# Patient Record
Sex: Female | Born: 1980 | Hispanic: Yes | Marital: Married | Smoking: Never smoker
Health system: Southern US, Community
[De-identification: ages and names within clinical notes are randomized; demographics above are authoritative.]

## PROBLEM LIST (undated history)

## (undated) ENCOUNTER — Inpatient Hospital Stay (HOSPITAL_COMMUNITY): Payer: Self-pay

## (undated) DIAGNOSIS — R519 Headache, unspecified: Secondary | ICD-10-CM

## (undated) DIAGNOSIS — O24419 Gestational diabetes mellitus in pregnancy, unspecified control: Secondary | ICD-10-CM

## (undated) DIAGNOSIS — Z87442 Personal history of urinary calculi: Secondary | ICD-10-CM

## (undated) DIAGNOSIS — I10 Essential (primary) hypertension: Secondary | ICD-10-CM

## (undated) DIAGNOSIS — J45909 Unspecified asthma, uncomplicated: Secondary | ICD-10-CM

## (undated) DIAGNOSIS — R87629 Unspecified abnormal cytological findings in specimens from vagina: Secondary | ICD-10-CM

## (undated) DIAGNOSIS — E119 Type 2 diabetes mellitus without complications: Secondary | ICD-10-CM

## (undated) DIAGNOSIS — F419 Anxiety disorder, unspecified: Secondary | ICD-10-CM

## (undated) DIAGNOSIS — R51 Headache: Secondary | ICD-10-CM

## (undated) DIAGNOSIS — O169 Unspecified maternal hypertension, unspecified trimester: Secondary | ICD-10-CM

## (undated) DIAGNOSIS — E559 Vitamin D deficiency, unspecified: Secondary | ICD-10-CM

## (undated) HISTORY — PX: COLONOSCOPY: SHX174

## (undated) HISTORY — PX: KIDNEY STONE SURGERY: SHX686

## (undated) HISTORY — PX: TONSILLECTOMY: SUR1361

## (undated) HISTORY — PX: WISDOM TOOTH EXTRACTION: SHX21

## (undated) HISTORY — DX: Type 2 diabetes mellitus without complications: E11.9

## (undated) SURGERY — Surgical Case
Anesthesia: Regional

---

## 2004-07-16 ENCOUNTER — Ambulatory Visit (HOSPITAL_COMMUNITY): Admission: RE | Admit: 2004-07-16 | Discharge: 2004-07-16 | Payer: Self-pay | Admitting: Family Medicine

## 2005-07-02 ENCOUNTER — Ambulatory Visit: Payer: Self-pay | Admitting: Internal Medicine

## 2005-07-03 ENCOUNTER — Encounter (INDEPENDENT_AMBULATORY_CARE_PROVIDER_SITE_OTHER): Payer: Self-pay | Admitting: *Deleted

## 2005-07-03 ENCOUNTER — Ambulatory Visit: Payer: Self-pay | Admitting: Internal Medicine

## 2005-10-02 ENCOUNTER — Ambulatory Visit: Payer: Self-pay | Admitting: Internal Medicine

## 2007-01-12 ENCOUNTER — Emergency Department (HOSPITAL_COMMUNITY): Admission: EM | Admit: 2007-01-12 | Discharge: 2007-01-12 | Payer: Self-pay | Admitting: Emergency Medicine

## 2008-02-09 ENCOUNTER — Emergency Department (HOSPITAL_COMMUNITY): Admission: EM | Admit: 2008-02-09 | Discharge: 2008-02-09 | Payer: Self-pay | Admitting: Emergency Medicine

## 2010-03-04 ENCOUNTER — Emergency Department (HOSPITAL_COMMUNITY): Admission: EM | Admit: 2010-03-04 | Discharge: 2010-03-05 | Payer: Self-pay | Admitting: Emergency Medicine

## 2010-08-17 LAB — POCT I-STAT, CHEM 8
Calcium, Ion: 1.14 mmol/L (ref 1.12–1.32)
Glucose, Bld: 100 mg/dL — ABNORMAL HIGH (ref 70–99)
HCT: 51 % — ABNORMAL HIGH (ref 36.0–46.0)
Hemoglobin: 17.3 g/dL — ABNORMAL HIGH (ref 12.0–15.0)
TCO2: 24 mmol/L (ref 0–100)

## 2010-08-17 LAB — D-DIMER, QUANTITATIVE: D-Dimer, Quant: <0.22 ug/mL-FEU (ref 0.00–0.48)

## 2011-03-07 LAB — DIFFERENTIAL
Basophils Absolute: 0
Eosinophils Relative: 0
Lymphocytes Relative: 25
Neutro Abs: 3.8
Neutrophils Relative %: 64

## 2011-03-07 LAB — CBC
Platelets: 179
RDW: 12.2
WBC: 6

## 2011-03-07 LAB — D-DIMER, QUANTITATIVE: D-Dimer, Quant: <0.22

## 2014-05-18 ENCOUNTER — Encounter (HOSPITAL_COMMUNITY): Payer: Self-pay | Admitting: Obstetrics & Gynecology

## 2014-06-02 ENCOUNTER — Other Ambulatory Visit (HOSPITAL_COMMUNITY): Payer: Self-pay

## 2014-07-26 ENCOUNTER — Other Ambulatory Visit (HOSPITAL_COMMUNITY): Payer: Self-pay | Admitting: Obstetrics

## 2014-07-26 DIAGNOSIS — O280 Abnormal hematological finding on antenatal screening of mother: Secondary | ICD-10-CM

## 2014-07-27 ENCOUNTER — Ambulatory Visit (HOSPITAL_COMMUNITY): Payer: Self-pay

## 2014-07-27 ENCOUNTER — Ambulatory Visit (HOSPITAL_COMMUNITY): Admission: RE | Admit: 2014-07-27 | Payer: Self-pay | Source: Ambulatory Visit

## 2014-07-30 ENCOUNTER — Ambulatory Visit (HOSPITAL_COMMUNITY)
Admission: RE | Admit: 2014-07-30 | Discharge: 2014-07-30 | Disposition: A | Discharge status: Home / Self Care | Payer: BC Managed Care – PPO | Source: Ambulatory Visit | Attending: Obstetrics | Admitting: Obstetrics

## 2014-07-30 ENCOUNTER — Other Ambulatory Visit (HOSPITAL_COMMUNITY): Payer: Self-pay

## 2014-07-30 ENCOUNTER — Encounter (HOSPITAL_COMMUNITY): Payer: Self-pay

## 2014-07-30 VITALS — BP 143/102 | HR 83 | Wt 156.0 lb

## 2014-07-30 DIAGNOSIS — Z3A18 18 weeks gestation of pregnancy: Secondary | ICD-10-CM

## 2014-07-30 DIAGNOSIS — O280 Abnormal hematological finding on antenatal screening of mother: Secondary | ICD-10-CM

## 2014-07-30 DIAGNOSIS — Z315 Encounter for genetic counseling: Secondary | ICD-10-CM | POA: Diagnosis present

## 2014-07-30 DIAGNOSIS — Z36 Encounter for antenatal screening of mother: Secondary | ICD-10-CM | POA: Insufficient documentation

## 2014-07-30 DIAGNOSIS — Z3689 Encounter for other specified antenatal screening: Secondary | ICD-10-CM | POA: Insufficient documentation

## 2014-07-30 DIAGNOSIS — O283 Abnormal ultrasonic finding on antenatal screening of mother: Secondary | ICD-10-CM | POA: Diagnosis not present

## 2014-07-30 HISTORY — DX: Essential (primary) hypertension: I10

## 2014-08-02 DIAGNOSIS — O280 Abnormal hematological finding on antenatal screening of mother: Secondary | ICD-10-CM | POA: Insufficient documentation

## 2014-08-02 LAB — US OB COMP LESS 14 WKS

## 2014-08-02 NOTE — Progress Notes (Signed)
Genetic Counseling  High-Risk Gestation Note  Appointment Date:  07/30/2014 Referred By: Lendon ColonelFogleman, Kelly A., MD Date of Birth:  1980-11-11 Partner: Auburn BilberryMatthew Degracia   Pregnancy History: G1P0 Estimated Date of Delivery: 12/26/14 Estimated Gestational Age: 6797w5d Attending: Particia NearingMartha Decker, MD    Ms. Burt EkFrances L Elizabeth Yang and her partner, Mr. Auburn BilberryMatthew Slaymaker, were seen for consultation for genetic counseling because of an elevated MSAFP of 2.86 MoMs based on maternal serum screening through [facility].    We reviewed Ms. Elizabeth Yang's maternal serum screening result, the elevation of MSAFP, and the associated 1 in 149 risk for a fetal open neural tube defect.   We reviewed ONTDs, the typical multifactorial etiology, and variable prognosis.  In addition, we discussed additional explanations for an elevated MSAFP including: normal variation, twins, feto-maternal bleeding, a gestational dating error, abdominal wall defects, kidney differences, oligohydramnios, and placental problems.  We discussed that an unexplained elevation of MSAFP is associated with an increased risk for third trimester complications including: prematurity, low birth weight, and pre-eclampsia.    We reviewed additional available screening and diagnostic options including detailed ultrasound and amniocentesis.  We discussed the risks, limitations, and benefits of each.  After thoughtful consideration of these options, Ms. Elizabeth Yang elected to have ultrasound, but declined amniocentesis.  She understands that ultrasound cannot rule out all birth defects or genetic syndromes.  However, she was counseled that 80-90% of fetuses with ONTDs can be detected by detailed 2nd trimester ultrasound, when well visualized.  A complete ultrasound was performed today.  The ultrasound report will be sent under separate cover.  Ms. [name] was provided with written information regarding cystic fibrosis (CF) including the carrier frequency and  incidence in the Caucasian population, the availability of carrier testing and prenatal diagnosis if indicated.  In addition, we discussed that CF is routinely screened for as part of the [place] newborn screening panel.  She declined testing today.   Both family histories were reviewed and found to be contributory for intellectual disability for Mr. Mcclatchy's female paternal first cousin. Her medical issues were attributed to problems at labor and delivery, and she died at age [id number] years old. Limited information was known regarding this individual. Additionally, Ms. Elizabeth Yang reported a sister with a history of a learning disability and Attention Deficit Hyperactivity Disorder (ADHD). The couple was counseled that there are many different causes of intellectual disabilities including environmental, multifactorial, and genetic etiologies.  We discussed that a specific diagnosis for intellectual disability can be determined in approximately 50% of these individuals.  In the remaining 50% of individuals, a diagnosis may never be determined.  Regarding genetic causes, we discussed that chromosome aberrations (aneuploidy, deletions, duplications, insertions, and translocations) are responsible for a small percentage of individuals with intellectual disability.  Many individuals with chromosome aberrations have additional differences, including congenital anomalies or minor dysmorphisms.  Likewise, single gene conditions are the underlying cause of intellectual delay in some families.  We discussed that many gene conditions have intellectual disability as a feature, but also often include other physical or medical differences.  We discussed the option of this family having an evaluation by a medical geneticist to help determine the cause of the familial intellectual disability.  We discussed that without more specific information, it is difficult to provide an accurate risk assessment.  Further genetic counseling is  warranted if more information is obtained.  Ms. Burt EkFrances L Elizabeth Yang denied exposure to environmental toxins or chemical agents. She denied the use of tobacco or street drugs. She  reported drinking alcohol on 04/03/14 and none since that time. The all-or-none period was discussed, meaning exposures that occur in the first 4 weeks of gestation are typically thought to either not affect the pregnancy at all or result in a miscarriage. She denied significant viral illnesses during the course of her pregnancy. Her medical and surgical histories were noncontributory.   I counseled this couple for approximately 40 minutes regarding the above risks and available options.    Quinn Plowman, MS,  Certified [employer]  08/02/2014

## 2014-09-24 ENCOUNTER — Encounter (HOSPITAL_COMMUNITY): Payer: Self-pay | Admitting: Obstetrics and Gynecology

## 2014-09-24 ENCOUNTER — Ambulatory Visit (HOSPITAL_COMMUNITY)
Admit: 2014-09-24 | Discharge: 2014-09-24 | Disposition: A | Discharge status: Home / Self Care | Payer: BC Managed Care – PPO | Attending: Obstetrics and Gynecology | Admitting: Obstetrics and Gynecology

## 2014-09-24 ENCOUNTER — Inpatient Hospital Stay (HOSPITAL_COMMUNITY): Payer: BC Managed Care – PPO

## 2014-09-24 ENCOUNTER — Inpatient Hospital Stay (HOSPITAL_COMMUNITY)
Admission: AD | Admit: 2014-09-24 | Discharge: 2014-10-02 | DRG: 765 | Disposition: A | Discharge status: Home / Self Care | Payer: BC Managed Care – PPO | Source: Ambulatory Visit | Attending: Obstetrics & Gynecology | Admitting: Obstetrics & Gynecology

## 2014-09-24 DIAGNOSIS — O162 Unspecified maternal hypertension, second trimester: Secondary | ICD-10-CM | POA: Diagnosis present

## 2014-09-24 DIAGNOSIS — F419 Anxiety disorder, unspecified: Secondary | ICD-10-CM | POA: Diagnosis present

## 2014-09-24 DIAGNOSIS — O368920 Maternal care for other specified fetal problems, second trimester, not applicable or unspecified: Secondary | ICD-10-CM | POA: Diagnosis present

## 2014-09-24 DIAGNOSIS — O99344 Other mental disorders complicating childbirth: Secondary | ICD-10-CM | POA: Diagnosis present

## 2014-09-24 DIAGNOSIS — O99824 Streptococcus B carrier state complicating childbirth: Secondary | ICD-10-CM | POA: Diagnosis present

## 2014-09-24 DIAGNOSIS — Z9889 Other specified postprocedural states: Secondary | ICD-10-CM

## 2014-09-24 DIAGNOSIS — O9081 Anemia of the puerperium: Secondary | ICD-10-CM | POA: Diagnosis not present

## 2014-09-24 DIAGNOSIS — Z3A27 27 weeks gestation of pregnancy: Secondary | ICD-10-CM | POA: Diagnosis present

## 2014-09-24 DIAGNOSIS — O864 Pyrexia of unknown origin following delivery: Secondary | ICD-10-CM | POA: Diagnosis not present

## 2014-09-24 DIAGNOSIS — D649 Anemia, unspecified: Secondary | ICD-10-CM | POA: Diagnosis not present

## 2014-09-24 DIAGNOSIS — O1092 Unspecified pre-existing hypertension complicating childbirth: Secondary | ICD-10-CM | POA: Diagnosis present

## 2014-09-24 DIAGNOSIS — O9952 Diseases of the respiratory system complicating childbirth: Secondary | ICD-10-CM | POA: Diagnosis present

## 2014-09-24 DIAGNOSIS — Z23 Encounter for immunization: Secondary | ICD-10-CM | POA: Diagnosis not present

## 2014-09-24 DIAGNOSIS — O112 Pre-existing hypertension with pre-eclampsia, second trimester: Secondary | ICD-10-CM | POA: Diagnosis present

## 2014-09-24 DIAGNOSIS — IMO0002 Reserved for concepts with insufficient information to code with codable children: Secondary | ICD-10-CM | POA: Diagnosis present

## 2014-09-24 DIAGNOSIS — O133 Gestational [pregnancy-induced] hypertension without significant proteinuria, third trimester: Secondary | ICD-10-CM

## 2014-09-24 DIAGNOSIS — Z3A26 26 weeks gestation of pregnancy: Secondary | ICD-10-CM | POA: Diagnosis present

## 2014-09-24 DIAGNOSIS — Z3490 Encounter for supervision of normal pregnancy, unspecified, unspecified trimester: Secondary | ICD-10-CM

## 2014-09-24 DIAGNOSIS — O365920 Maternal care for other known or suspected poor fetal growth, second trimester, not applicable or unspecified: Secondary | ICD-10-CM | POA: Diagnosis present

## 2014-09-24 DIAGNOSIS — J45909 Unspecified asthma, uncomplicated: Secondary | ICD-10-CM | POA: Diagnosis present

## 2014-09-24 DIAGNOSIS — O1410 Severe pre-eclampsia, unspecified trimester: Secondary | ICD-10-CM | POA: Diagnosis present

## 2014-09-24 HISTORY — DX: Unspecified asthma, uncomplicated: J45.909

## 2014-09-24 HISTORY — DX: Headache, unspecified: R51.9

## 2014-09-24 HISTORY — DX: Unspecified abnormal cytological findings in specimens from vagina: R87.629

## 2014-09-24 HISTORY — DX: Anxiety disorder, unspecified: F41.9

## 2014-09-24 HISTORY — DX: Headache: R51

## 2014-09-24 LAB — URINALYSIS, ROUTINE W REFLEX MICROSCOPIC
Bilirubin Urine: NEGATIVE
GLUCOSE, UA: NEGATIVE mg/dL
Ketones, ur: NEGATIVE mg/dL
LEUKOCYTES UA: NEGATIVE
Nitrite: NEGATIVE
PROTEIN: NEGATIVE mg/dL
Specific Gravity, Urine: 1.02 (ref 1.005–1.030)
Urobilinogen, UA: 0.2 mg/dL (ref 0.0–1.0)
pH: 6 (ref 5.0–8.0)

## 2014-09-24 LAB — COMPREHENSIVE METABOLIC PANEL
ALBUMIN: 3.3 g/dL — AB (ref 3.5–5.2)
ALK PHOS: 80 U/L (ref 39–117)
ALT: 25 U/L (ref 0–35)
AST: 13 U/L (ref 0–37)
Anion gap: 7 (ref 5–15)
BUN: 15 mg/dL (ref 6–23)
CO2: 22 mmol/L (ref 19–32)
Calcium: 9.3 mg/dL (ref 8.4–10.5)
Chloride: 106 mmol/L (ref 96–112)
Creatinine, Ser: 0.82 mg/dL (ref 0.50–1.10)
GLUCOSE: 77 mg/dL (ref 70–99)
POTASSIUM: 4.6 mmol/L (ref 3.5–5.1)
SODIUM: 135 mmol/L (ref 135–145)
TOTAL PROTEIN: 6.7 g/dL (ref 6.0–8.3)
Total Bilirubin: 0.4 mg/dL (ref 0.3–1.2)

## 2014-09-24 LAB — CBC
HEMATOCRIT: 37 % (ref 36.0–46.0)
Hemoglobin: 13.2 g/dL (ref 12.0–15.0)
MCH: 32.4 pg (ref 26.0–34.0)
MCHC: 35.7 g/dL (ref 30.0–36.0)
MCV: 90.9 fL (ref 78.0–100.0)
Platelets: 193 10*3/uL (ref 150–400)
RBC: 4.07 MIL/uL (ref 3.87–5.11)
RDW: 13.2 % (ref 11.5–15.5)
WBC: 11.1 10*3/uL — AB (ref 4.0–10.5)

## 2014-09-24 LAB — TYPE AND SCREEN
ABO/RH(D): A POS
ANTIBODY SCREEN: NEGATIVE

## 2014-09-24 LAB — CREATININE CLEARANCE, URINE, 24 HOUR
COLLECTION INTERVAL-CRCL: 24 h
CREAT CLEAR: 132 mL/min — AB (ref 75–115)
CREATININE 24H UR: 1554 mg/d (ref 700–1800)
Creatinine, Urine: 53.58 mg/dL
URINE TOTAL VOLUME-CRCL: 2900 mL

## 2014-09-24 LAB — PROTEIN, URINE, 24 HOUR
COLLECTION INTERVAL-UPROT: 24 h
URINE TOTAL VOLUME-UPROT: 2900 mL

## 2014-09-24 LAB — MAGNESIUM: Magnesium: 1.8 mg/dL (ref 1.5–2.5)

## 2014-09-24 LAB — URINE MICROSCOPIC-ADD ON

## 2014-09-24 LAB — URIC ACID: URIC ACID, SERUM: 5.3 mg/dL (ref 2.4–7.0)

## 2014-09-24 LAB — ABO/RH: ABO/RH(D): A POS

## 2014-09-24 LAB — GROUP B STREP BY PCR: GROUP B STREP BY PCR: POSITIVE — AB

## 2014-09-24 MED ORDER — LACTATED RINGERS IV SOLN
INTRAVENOUS | Status: DC
  Administered 2014-09-24 – 2014-09-25 (×3): via INTRAVENOUS

## 2014-09-24 MED ORDER — DOCUSATE SODIUM 100 MG PO CAPS
100.0000 mg | ORAL_CAPSULE | Freq: Every day | ORAL | Status: DC
  Administered 2014-09-24 – 2014-09-26 (×3): 100 mg via ORAL
  Filled 2014-09-24 (×5): qty 1

## 2014-09-24 MED ORDER — HYDRALAZINE HCL 10 MG PO TABS
10.0000 mg | ORAL_TABLET | Freq: Four times a day (QID) | ORAL | Status: DC
  Administered 2014-09-24 – 2014-09-26 (×10): 10 mg via ORAL
  Filled 2014-09-24 (×12): qty 1

## 2014-09-24 MED ORDER — BETAMETHASONE SOD PHOS & ACET 6 (3-3) MG/ML IJ SUSP
12.0000 mg | INTRAMUSCULAR | Status: AC
  Administered 2014-09-24 – 2014-09-25 (×2): 12 mg via INTRAMUSCULAR
  Filled 2014-09-24 (×2): qty 2

## 2014-09-24 MED ORDER — LABETALOL HCL 300 MG PO TABS
600.0000 mg | ORAL_TABLET | Freq: Three times a day (TID) | ORAL | Status: DC
  Administered 2014-09-24 – 2014-09-26 (×7): 600 mg via ORAL
  Filled 2014-09-24 (×8): qty 2

## 2014-09-24 MED ORDER — MAGNESIUM SULFATE BOLUS VIA INFUSION
4.0000 g | Freq: Once | INTRAVENOUS | Status: AC
  Administered 2014-09-24: 4 g via INTRAVENOUS
  Filled 2014-09-24: qty 500

## 2014-09-24 MED ORDER — LABETALOL HCL 5 MG/ML IV SOLN
20.0000 mg | Freq: Once | INTRAVENOUS | Status: AC
  Administered 2014-09-24: 20 mg via INTRAVENOUS
  Filled 2014-09-24: qty 4

## 2014-09-24 MED ORDER — PRENATAL MULTIVITAMIN CH
1.0000 | ORAL_TABLET | Freq: Every day | ORAL | Status: DC
  Administered 2014-09-25 – 2014-09-26 (×2): 1 via ORAL
  Filled 2014-09-24 (×4): qty 1

## 2014-09-24 MED ORDER — ACETAMINOPHEN 325 MG PO TABS
650.0000 mg | ORAL_TABLET | ORAL | Status: DC | PRN
  Filled 2014-09-24: qty 2

## 2014-09-24 MED ORDER — MAGNESIUM SULFATE 50 % IJ SOLN
2.0000 g/h | INTRAVENOUS | Status: DC
  Administered 2014-09-24: 2 g/h via INTRAVENOUS
  Filled 2014-09-24 (×2): qty 80

## 2014-09-24 MED ORDER — LABETALOL HCL 5 MG/ML IV SOLN
10.0000 mg | Freq: Once | INTRAVENOUS | Status: AC
  Administered 2014-09-27 (×2): 10 mg via INTRAVENOUS
  Filled 2014-09-24 (×2): qty 4

## 2014-09-24 MED ORDER — ZOLPIDEM TARTRATE 5 MG PO TABS
5.0000 mg | ORAL_TABLET | Freq: Every evening | ORAL | Status: DC | PRN
  Administered 2014-09-25: 5 mg via ORAL
  Filled 2014-09-24: qty 1

## 2014-09-24 MED ORDER — LABETALOL HCL 300 MG PO TABS
400.0000 mg | ORAL_TABLET | Freq: Three times a day (TID) | ORAL | Status: DC
  Administered 2014-09-24: 400 mg via ORAL
  Filled 2014-09-24 (×2): qty 1

## 2014-09-24 MED ORDER — NIFEDIPINE ER OSMOTIC RELEASE 30 MG PO TB24: 30.0000 mg | ORAL_TABLET | Freq: Every day | ORAL | Status: DC

## 2014-09-24 MED ORDER — CALCIUM CARBONATE ANTACID 500 MG PO CHEW
2.0000 | CHEWABLE_TABLET | ORAL | Status: DC | PRN
  Filled 2014-09-24: qty 2

## 2014-09-24 NOTE — Progress Notes (Signed)
Dr. Cherly Hensenousins paged to inform of BP's, awaiting return call.

## 2014-09-24 NOTE — Progress Notes (Addendum)
MFM consultation staff note:   Discussion:  I. IUGR:  I spoke to your patient by way of consultation about the diagnosis of early onset growth restriction in the context of an elevated alpha-fetoprotein, and lack of structural fetal defect. I explained to her that there are number of causes of intrauterine growth restriction. Such causes of intrauterine growth restriction are either derived from maternal or fetal etiology.  First, I explained to her that there is increased risk for intrauterine growth restriction in women with vascular diseases such as hypertension given increased resistance in uteroplacental blood flow in such women. She has HTN. Given that this is a fetus with increased alpha-fetoprotein, with asymmetric growth restriction, this leads my intuition towards a placental underlying etiology.  Second, I discussed the possibility of underlying aneuploidy in the context of early onset growth restriction.  I offered amniocentesis or cell free fetal DNA (panorama or NIPS) detailing to her the risks and differences in purpose (testing vs screening). While the Panorama test is 99% sensitive for Down syndrome (T21), T18, and T13, it is not 100% and is not diagnostic. I explained to her the difference between a screening test and diagnostic test(karyotype/microarray by amniocentesis), offering her again amniocentesis or NIPS. At this point in time, she does not wish to pursue NIPS or amniocentesis and she understands the difference between a very good screening test and a diagnostic test of amniocentesis.  Third, I explained to her the use of Doppler ultrasound for both umbilical artery Dopplers as well as ductus venosus Dopplers, for instance umbilical artery Doppler S/D ratio, absence of end-diastolic flow as well as reversal of end-diastolic flow. I explained these tests and findings in plain terms to her. She seemed to have an excellent grasp of how they are used.  She understands that her fetus  has abnormal fetal-placental blood flow with persistent reverse diastolic flow in the umbilical artery waveforms and reverse A wave on DV Dopplers implying right heart strain and dysfunction due to IUGR.  II. CHTN/superimposed preeclampsia:  I reviewed hypertension and preeclampsia hypertension as a cause of uteroplacental insufficiency, with increased risk of IUGR, oligohydramnios, and stillbirth. I told her that her hypertension with superimposed preeclampsia also places her at increased risk for eclampsia, describing the triad of increased blood pressure, proteinuria (>300mg on 24 hour), and abnormal edema that she currently demonstrates. She is additionally reporting mild headache in setting of normal LFTs, platelets, and Cr. Lastly, hypertension (severe range) increases the risk of placental abruption, especially in the setting of superimposed preeclampsia.  I reviewed the essential tenets in the most recent guidelines for management of hypertension in pregnancy in accordance with the American College of Obstetrics and Gynecology expert opinion. We talked about the adjusting medical treatment of hypertension in pregnancy. Currently, she does meet criteria (>160/110) and will likely needs adjustment in her treatment.  I would begin with MgSO4 6gm bolus and 2gm per hour drip.  If persistent severe HTN is still noted again increase Labetalol from 400mg po tid to 600mg po tid and continue to titrate dose as long as she has severe HTN.  MFM will remain available for questions as you request.  I explained to her that there was a possibility for need for delivery in the context of hypertensive disorder of pregnancy. I explained to her the condition of preeclampsia, citing it as a disorder unique to pregnancy that is described by the triad of hypertension, proteinuria, and edema. As you well know, preeclampsia if left unmonitored,   can prove detrimental to maternal and/or fetal well-being. Risks include renal  injury, eclampsia, stroke, myocardial infarction, maternal death, and fetal death.   Impressions: SIUP at 26wks 5days, elevated msAFP EFW 18th%'le, AC 5th%'el Echogenic bowel with dilated loops Normal amniotic fluid volume  UA Dopplers with persistent reversed flow of umbilical artery. DV Dopplers with reversal of a-wave  Uncontrolled CHTN with likely superimposed preeclampsia  Recommendations: 1. Admit until delivery 2. MgSO4 6gm bolus and 2gm/hr drip for 24 hours (eclampsia prophylaxis and CP prophylaxis for neonate) 3. CEFM 4. antenatal corticosteroids 5. Dopplers three times weekly (M,W,F) noting decision for delivery is not predicated by Doppler results until at least [redacted] weeks gestation 6. decision for delivery should be based on maternal and/or fetal deterioration as evidenced by clinical/laboratory/EFM findings 7. NICU consultation 8. genetic testing by way of amniocentesis or cell free fetal DNA was offered but declined by patient.  As discussed, I would personally find it important to rule out lethal aneuploidy (eg, T18, T13) should emergent cesarean be warranted by fetal heart tracing, this might affect patient decision making.  However, the patient indicated that she was not interested and I spent in excess of 60 minutes on this evaluation, discussion, and consultation report.  I will leave this to her discretion.  She may ask us again if desired noting that our unit will need to schedule this appointment for further genetic counseling with our counselors if she requests.  She declined an appointment with a genetic counselor appointment initially to me but please contact our office if she desires to schedule (they would see her as an inpatient just let us know). 9. recommend APS (antiphospholipid antibody syndrome panel:  anticardiolipin IgG/IgM, antibeta2glycoprotein I IgG/IgM, and lupus anticoagulant) 10. Continue labetalol 400mg po tid for CHTN.  If blood pressures continue to  remain severe once MgSO4 is started recommend increasing to 600mg po tid and would continue to titrate to goal BP:  130-159/70-109 mmHg. 11. Given need for 24 hour in house obstetrician and anesthesia ready for emergent delivery by cesarean in setting of NRFHT (should this occur), our service would gladly accept transfer of care with transport to [facility] under [facility].  Alternatively, we will remain available for consultation depending on your desire. 12.  Would repeat preeclampsia labs again in the next 6 hours to ensure maternal liver and kidney function as well as blood counts are stable.  Then repeat again tomorrow.  If stable would repeat twice weekly thereafter.  Time Spent: I spent in excess of 60 minutes in consultation with this patient to review records, evaluate her case, and provide her with an adequate discussion and education.  More than 50% of this time was spent in direct face-to-face counseling.  It was a pleasure seeing your patient in the office today.  Thank you for consultation. Please do not hesitate to contact our service for any further questions.   Thank you,  Jeffrey Morgan Denney   Denney, Jeffrey Morgan, MD, MS, FACOG Assistant Professor Section of Maternal-Fetal Medicine [employer]   

## 2014-09-24 NOTE — Progress Notes (Signed)
MD not notified regarding BP secondary MD stated to give pt po meds as ordered

## 2014-09-24 NOTE — MAU Provider Note (Signed)
History     CSN: [id number]  Arrival date and time: 09/24/14 1058  Orders placed in EPIC: 1105 Provider at bedside: 1130     Chief Complaint  Patient presents with  . Hypertension  . Headache   HPI  Elizabeth Yang is a 34 yo G1P0 female at 26.[redacted] wks gestation sent over from the office this morning with worsening CHTN and Headache.  She reports she began having a H/A last night.  She was seen 2 days ago in the office had a BP of 180/120 with PIH labs and sent home with a 24 hr urine to be returned to the office today.  She is taking Labetatlol 200 mg TID.  She collected a 24 hr urine yesterday and [facility]  She reports swelling in BLE, but not anymore than her usual swelling. (+) FM.   Past Medical History  Diagnosis Date  . Hypertension   . Elevated blood pressure affecting pregnancy in second trimester, antepartum 09/24/2014  . Asthma   . Vaginal Pap smear, abnormal   . Anxiety   . Headache     Past Surgical History  Procedure Laterality Date  . Tonsillectomy    . Wisdom tooth extraction      History reviewed. No pertinent family history.  History  Substance Use Topics  . Smoking status: Never Smoker   . Smokeless tobacco: Never Used  . Alcohol Use: No    Allergies: No Known Allergies  Prescriptions prior to admission  Medication Sig Dispense Refill Last Dose  . albuterol (PROVENTIL HFA;VENTOLIN HFA) 108 (90 BASE) MCG/ACT inhaler Inhale 2 puffs into the lungs every 6 (six) hours as needed for wheezing or shortness of breath.     . labetalol (NORMODYNE) 200 MG tablet Take 400 mg by mouth 3 (three) times daily.   09/24/2014 at Unknown time  . Prenatal Vit-Fe Fumarate-FA (PRENATAL MULTIVITAMIN) TABS tablet Take 1 tablet by mouth daily at 12 noon.   09/24/2014 at Unknown time    Review of Systems  Constitutional: Negative.   Eyes: Negative.   Respiratory: Negative.   Cardiovascular: Positive for leg swelling.   Gastrointestinal: Negative.   Genitourinary: Negative.   Musculoskeletal: Negative.   Skin: Negative.   Neurological: Positive for headaches.  Endo/Heme/Allergies: Negative.   Psychiatric/Behavioral: Negative.    Results for orders placed or performed during the hospital encounter of 09/24/14 (from the past 24 hour(s))  CBC     Status: Abnormal   Collection Time: 09/24/14 11:04 AM  Result Value Ref Range   WBC 11.1 (H) 4.0 - 10.5 K/uL   RBC 4.07 3.87 - 5.11 MIL/uL   Hemoglobin 13.2 12.0 - 15.0 g/dL   HCT 40.937.0 81.136.0 - 91.446.0 %   MCV 90.9 78.0 - 100.0 fL   MCH 32.4 26.0 - 34.0 pg   MCHC 35.7 30.0 - 36.0 g/dL   RDW 78.213.2 95.611.5 - 21.315.5 %   Platelets 193 150 - 400 K/uL  Urinalysis, Routine w reflex microscopic     Status: Abnormal   Collection Time: 09/24/14 11:08 AM  Result Value Ref Range   Color, Urine YELLOW YELLOW   APPearance CLEAR CLEAR   Specific Gravity, Urine 1.020 1.005 - 1.030   pH 6.0 5.0 - 8.0   Glucose, UA NEGATIVE NEGATIVE mg/dL   Hgb urine dipstick TRACE (A) NEGATIVE   Bilirubin Urine NEGATIVE NEGATIVE   Ketones, ur NEGATIVE NEGATIVE mg/dL   Protein, ur NEGATIVE NEGATIVE mg/dL  Urobilinogen, UA 0.2 0.0 - 1.0 mg/dL   Nitrite NEGATIVE NEGATIVE   Leukocytes, UA NEGATIVE NEGATIVE  Urine microscopic-add on     Status: Abnormal   Collection Time: 09/24/14 11:08 AM  Result Value Ref Range   Squamous Epithelial / LPF RARE RARE   WBC, UA 0-2 <3 WBC/hpf   RBC / HPF 3-6 <3 RBC/hpf   Bacteria, UA FEW (A) RARE  Comprehensive metabolic panel     Status: Abnormal   Collection Time: 09/24/14 11:25 AM  Result Value Ref Range   Sodium 135 135 - 145 mmol/L   Potassium 4.6 3.5 - 5.1 mmol/L   Chloride 106 96 - 112 mmol/L   CO2 22 19 - 32 mmol/L   Glucose, Bld 77 70 - 99 mg/dL   BUN 15 6 - 23 mg/dL   Creatinine, Ser 1.61 0.50 - 1.10 mg/dL   Calcium 9.3 8.4 - 09.6 mg/dL   Total Protein 6.7 6.0 - 8.3 g/dL   Albumin 3.3 (L) 3.5 - 5.2 g/dL   AST 13 0 - 37 U/L   ALT 25 0 - 35  U/L   Alkaline Phosphatase 80 39 - 117 U/L   Total Bilirubin 0.4 0.3 - 1.2 mg/dL   Anion gap 7.0 5 - 15  Uric acid     Status: None   Collection Time: 09/24/14 11:25 AM  Result Value Ref Range   Uric Acid, Serum 5.3 2.4 - 7.0 mg/dL   OB U/S Complete >04VWU See note from MFM  Physical Exam   Blood pressure 157/107, pulse 64, temperature 98.3 F (36.8 C), temperature source Oral, resp. rate 18, height  (1.575 m), weight 78.926 kg (174 lb).  Physical Exam  Constitutional: She is oriented to person, place, and time. She appears well-developed and well-nourished.  HENT:  Head: Normocephalic and atraumatic.  Moderate H/A since last night; minimally photophobic  Eyes: Pupils are equal, round, and reactive to light.  Neck: Normal range of motion.  Cardiovascular: Normal rate, regular rhythm, normal heart sounds and intact distal pulses.   1+ non-pitting edema in BLE  Respiratory: Effort normal and breath sounds normal.  GI: Soft. Bowel sounds are normal.  Genitourinary:  (+) FM; denies VB or LOF  Musculoskeletal: Normal range of motion.  Neurological: She is alert and oriented to person, place, and time. She has normal reflexes.  Skin: Skin is warm and dry.  Psychiatric: She has a normal mood and affect. Her behavior is normal. Judgment and thought content normal.    MAU Course  Procedures  24 hr Urine - total protein and creatinine clearance CBC / CMP / Uric Acid  Serial BPs CEFM OB U/S Complete (AFI/BPP/Growth)  Assessment and Plan  IUP @ 26.[redacted] wks gestation Category 1 FHR tracing CHTN with superimposed severe pre-eclampsia  Admit to Antenatal Unit Regular antenatal admission orders LR IV @ 125 mL/hr Antiphospholipid Syndrome Evaluation, blood Magnesium Sulfate 4 grams loading dose, then 2 grams every hour  *Consult with Dr. Cherly Hensen on assessment / admission orders received from Dr. Cherly Hensen / care assumed by Dr. Cherly Hensen in to see pt at 1430   Raelyn Mora, M  MSN, CNM 09/24/2014, 11:42 AM

## 2014-09-24 NOTE — Progress Notes (Signed)
MD @ bedside, new orders received.

## 2014-09-24 NOTE — H&P (Signed)
Burt EkFrances L Constantino is a 34 y.o. female  G1P0 SWF @ 8526 5/[redacted] weeks gestation by 1st trim sono presenting from the office due to elev BP and h/a x 2 days now being admitted due to elevated BP. See MAU note from R. Dawson cnm. 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  Pt was seen in office by Dr Ernestina Pennafogleman 4/20 after she complained of not feeling well. She was there for 3hr GTT. BP was 180/120. She was sent home with urine collection planned. Rpt BP was 160/98 per record. Chart review notes pt was not on any BP med until c/o elev BP 2/29 and was started on labetalol 100mg  po TID which was then increased to 200mg  po TID. Per pt, she was started on labetalol 400 mg po TID 3 days ago.  PNC was complicated by unexplained elevated AFP x 2 for which she was seen by MFM and had nl fetal sonogram. History OB History    Gravida Para Term Preterm AB TAB SAB Ectopic Multiple Living   1              Past Medical History  Diagnosis Date  . Hypertension   . Elevated blood pressure affecting pregnancy in second trimester, antepartum 09/24/2014  . Asthma   . Vaginal Pap smear, abnormal   . Anxiety   . Headache    Past Surgical History  Procedure Laterality Date  . Tonsillectomy    . Wisdom tooth extraction     Family History: family history includes Cancer in her maternal grandmother; Diabetes in her paternal grandmother; Heart disease in her maternal aunt, maternal grandfather, maternal uncle, and mother; Hypertension in her mother; Kidney disease in her sister. Social History:  reports that she has never smoked. She has never used smokeless tobacco. She reports that she does not drink alcohol or use illicit drugs.   Prenatal Transfer Tool  Maternal Diabetes: No Genetic Screening: Abnormal:  Results: Elevated AFP Maternal Ultrasounds/Referrals: Abnormal:  Findings:   Echogenic bowel Fetal Ultrasounds or other Referrals:  None, Referred  to Materal Fetal Medicine  Maternal Substance Abuse:  No Significant Maternal Medications:  Meds include: Other: labetalol Significant Maternal Lab Results:  Lab values include: Other: GBS cx pending Other Comments:  unexplained elev AFP, chronic HTN   Review of Systems  Eyes: Negative for blurred vision.  Gastrointestinal: Negative for heartburn and abdominal pain.  Neurological: Positive for headaches.    Dilation: Closed Effacement (%): Thick Station: Ballotable Exam by:: Dr. Cherly Hensenousins Blood pressure 146/100, pulse 81, temperature 98.2 F (36.8 C), temperature source Oral, resp. rate 20, height 5\' 2"  (1.575 m), weight 78.926 kg (174 lb). Maternal Exam:  Abdomen: Patient reports no abdominal tenderness. Fetal presentation: vertex  Introitus: Normal vulva.   Physical Exam  Constitutional: She is oriented to person, place, and time. She appears well-developed and well-nourished.  HENT:  Head: Atraumatic.  Eyes: EOM are normal.  Neck: Neck supple.  Cardiovascular: Normal rate and regular rhythm.   Respiratory: Effort normal.  GI: Soft. There is no tenderness.  Musculoskeletal: She exhibits no edema.  Neurological: She is alert and oriented to person, place, and time.  Skin: Skin is warm and dry.  Psychiatric: She has a normal mood and affect.    Prenatal labs: ABO, Rh: --/--/A POS (04/22 1450) Antibody: NEG (04/22 1450) Rubella:  Immune RPR:   NR HBsAg:   neg HIV:  neg GBS:   pending   Assessment/Plan: Worsening chronic HTn r/o superimposed preeclampsia Growth restriction with reverse dopplers IUP @ 26 5/7 weeks Unexplained elevated AFP New echogenic bowel  P) admit PIH labs.  IV Magnesium for CP and preeclampsia prec. Await 24 hr UTP/crCL results( brought from office) Continuous fetal monitoring. BMZ. Check GBS cx. MFM  Consult( done). sono done with MFM. Based on those findings spoke with pt and partner regarding genetic testing as was disc by MFM.  Still  deciding. Disc if fetus has abnormality not compatible with life such as trisomy 2 , mgmt w/r to mode of delivery will need to take that into consideration. Spoke with Dr Katherina Right again who concurs. He was notified despite his consultation  pt and partner does not appear to have a grasp of this issue. Will add apresoline as procardia cannot be used with magnesium. Will cont labetalol oral dose and increase accordingly and use IV labetalol as needed. NICU consult. Advised pt she will need be inpt until delivery. Disc the difference between amnioncentesis and cell free fetal DNA testing in particularly the turnover time and that amnio is not done over w/e. Pt will disc and let us know her plan. Repeat PIH labs in am   Tanajah Boulter A 09/24/2014, 7:54 PM

## 2014-09-24 NOTE — Plan of Care (Signed)
Problem: Consults Goal: Birthing Suites Patient Information Press F2 to bring up selections list   Pt < [redacted] weeks EGA     

## 2014-09-24 NOTE — Consult Note (Signed)
Asked by Dr Cherly Hensenousins to speak to Elizabeth Yang to discuss outcome of preterm pregnancy at 26 weeks with complications of chronic hpn, preeclampsia and  IUGR.  Other concerns include elevated AFP, MFM/Genetics consult done. She has received her first dose of betamethasone this afternoon and started magnesium sulfate. Other meds: labetalol, hydralazine.   I discussed Neonatology presence at delivery and resuscitation. I talked about common morbidities associated with this gestation such as RDS, GI immaturity, CNS fragility and risks of ROP. Discussed respiratory support, surfactant,  need for temp support, HAL, umbilical lines, gavage feeding, and LOS.   I also discussed breast feeding and its  benefits especially to a preterm baby.   I answered her questions to her satisfaction.  I spent 30 minutes with this consult, more than 50% of the time was with face-to-face counseling with Elizabeth Yang.   [name], MD  Neonatologist

## 2014-09-24 NOTE — Progress Notes (Signed)
Clarification on the procardia and the 24 hour urine collection. Per MD d/c them both. Pt turned in a 24 hour urine collection earlier today and results are in process.

## 2014-09-24 NOTE — Consult Note (Signed)
MFM consultation staff note:   Discussion:  I. IUGR:  I spoke to your patient by way of consultation about the diagnosis of early onset growth restriction in the context of an elevated alpha-fetoprotein, and lack of structural fetal defect. I explained to her that there are number of causes of intrauterine growth restriction. Such causes of intrauterine growth restriction are either derived from maternal or fetal etiology.  First, I explained to her that there is increased risk for intrauterine growth restriction in women with vascular diseases such as hypertension given increased resistance in uteroplacental blood flow in such women. She has HTN. Given that this is a fetus with increased alpha-fetoprotein, with asymmetric growth restriction, this leads my intuition towards a placental underlying etiology.  Second, I discussed the possibility of underlying aneuploidy in the context of early onset growth restriction.  I offered amniocentesis or cell free fetal DNA (panorama or NIPS) detailing to her the risks and differences in purpose (testing vs screening). While the Panorama test is 99% sensitive for Down syndrome (T21), T18, and T13, it is not 100% and is not diagnostic. I explained to her the difference between a screening test and diagnostic test(karyotype/microarray by amniocentesis), offering her again amniocentesis or NIPS. At this point in time, she does not wish to pursue NIPS or amniocentesis and she understands the difference between a very good screening test and a diagnostic test of amniocentesis.  Third, I explained to her the use of Doppler ultrasound for both umbilical artery Dopplers as well as ductus venosus Dopplers, for instance umbilical artery Doppler S/D ratio, absence of end-diastolic flow as well as reversal of end-diastolic flow. I explained these tests and findings in plain terms to her. She seemed to have an excellent grasp of how they are used.  She understands that her fetus  has abnormal fetal-placental blood flow with persistent reverse diastolic flow in the umbilical artery waveforms and reverse A wave on DV Dopplers implying right heart strain and dysfunction due to IUGR.  II. CHTN/superimposed preeclampsia:  I reviewed hypertension and preeclampsia hypertension as a cause of uteroplacental insufficiency, with increased risk of IUGR, oligohydramnios, and stillbirth. I told her that her hypertension with superimposed preeclampsia also places her at increased risk for eclampsia, describing the triad of increased blood pressure, proteinuria (>300mg  on 24 hour), and abnormal edema that she currently demonstrates. She is additionally reporting mild headache in setting of normal LFTs, platelets, and Cr. Lastly, hypertension (severe range) increases the risk of placental abruption, especially in the setting of superimposed preeclampsia.  I reviewed the essential tenets in the most recent guidelines for management of hypertension in pregnancy in accordance with the American College of Obstetrics and Gynecology expert opinion. We talked about the adjusting medical treatment of hypertension in pregnancy. Currently, she does meet criteria (>160/110) and will likely needs adjustment in her treatment.  I would begin with MgSO4 6gm bolus and 2gm per hour drip.  If persistent severe HTN is still noted again increase Labetalol from 400mg  po tid to 600mg  po tid and continue to titrate dose as long as she has severe HTN.  MFM will remain available for questions as you request.  I explained to her that there was a possibility for need for delivery in the context of hypertensive disorder of pregnancy. I explained to her the condition of preeclampsia, citing it as a disorder unique to pregnancy that is described by the triad of hypertension, proteinuria, and edema. As you well know, preeclampsia if left unmonitored,  can prove detrimental to maternal and/or fetal well-being. Risks include renal  injury, eclampsia, stroke, myocardial infarction, maternal death, and fetal death.   Impressions: SIUP at 26wks 5days, elevated msAFP EFW 18th%'le, AC 5th%'el Echogenic bowel with dilated loops Normal amniotic fluid volume  UA Dopplers with persistent reversed flow of umbilical artery. DV Dopplers with reversal of a-wave  Uncontrolled CHTN with likely superimposed preeclampsia  Recommendations: 1. Admit until delivery 2. MgSO4 6gm bolus and 2gm/hr drip for 24 hours (eclampsia prophylaxis and CP prophylaxis for neonate) 3. CEFM 4. antenatal corticosteroids 5. Dopplers three times weekly (M,W,F) noting decision for delivery is not predicated by Doppler results until at least [redacted] weeks gestation 6. decision for delivery should be based on maternal and/or fetal deterioration as evidenced by clinical/laboratory/EFM findings 7. NICU consultation 8. genetic testing by way of amniocentesis or cell free fetal DNA was offered but declined by patient.  As discussed, I would personally find it important to rule out lethal aneuploidy (eg, T18, T13) should emergent cesarean be warranted by fetal heart tracing, this might affect patient decision making.  However, the patient indicated that she was not interested and I spent in excess of 60 minutes on this evaluation, discussion, and consultation report.  I will leave this to her discretion.  She may ask [place] again if desired noting that our unit will need to schedule this appointment for further genetic counseling with our counselors if she requests.  She declined an appointment with a genetic counselor appointment initially to me but please contact our office if she desires to schedule (they would see her as an inpatient just let us know). 9. recommend APS (antiphospholipid antibody syndrome panel:  anticardiolipin IgG/IgM, antibeta2glycoprotein I IgG/IgM, and lupus anticoagulant) 10. Continue labetalol  po tid for CHTN.  If blood pressures continue to  remain severe once MgSO4 is started recommend increasing to  po tid and would continue to titrate to goal BP:  130-159/70-109 mmHg. 11. Given need for 24 hour in house obstetrician and anesthesia ready for emergent delivery by cesarean in setting of NRFHT (should this occur), our service would gladly accept transfer of care with transport to [facility] under [facility] MFM.  Alternatively, we will remain available for consultation depending on your desire. 12.  Would repeat preeclampsia labs again in the next 6 hours to ensure maternal liver and kidney function as well as blood counts are stable.  Then repeat again tomorrow.  If stable would repeat twice weekly thereafter.  Time Spent: I spent in excess of 60 minutes in consultation with this patient to review records, evaluate her case, and provide her with an adequate discussion and education.  More than 50% of this time was spent in direct face-to-face counseling.  It was a pleasure seeing your patient in the office today.  Thank you for consultation. Please do not hesitate to contact our service for any further questions.   Thank you,  Louann Sjogren Gaynelle Arabian, Louann Sjogren, MD, MS, FACOG Assistant Professor Section of Maternal-Fetal Medicine [facility]

## 2014-09-24 NOTE — Plan of Care (Signed)
Problem: Phase II Progression Outcomes Goal: Medications as ordered (see description) Medications as ordered (Magnesium Sulfate, Steroids, PO Tocolysis, Antibiotics, Terbutaline Pump, Other)  Outcome: Progressing Pt admitted with elevated BP's, currently on labetalol 400mg  tid, Magnesium Sulfate 4Gm bolus initiated and then 2 Gm/hr.

## 2014-09-25 LAB — CBC
HCT: 36.7 % (ref 36.0–46.0)
Hemoglobin: 13.3 g/dL (ref 12.0–15.0)
MCH: 32.9 pg (ref 26.0–34.0)
MCHC: 36.2 g/dL — ABNORMAL HIGH (ref 30.0–36.0)
MCV: 90.8 fL (ref 78.0–100.0)
PLATELETS: 206 10*3/uL (ref 150–400)
RBC: 4.04 MIL/uL (ref 3.87–5.11)
RDW: 13.1 % (ref 11.5–15.5)
WBC: 19.4 10*3/uL — AB (ref 4.0–10.5)

## 2014-09-25 LAB — COMPREHENSIVE METABOLIC PANEL
ALK PHOS: 80 U/L (ref 39–117)
ALT: 26 U/L (ref 0–35)
AST: 17 U/L (ref 0–37)
Albumin: 3.1 g/dL — ABNORMAL LOW (ref 3.5–5.2)
Anion gap: 9 (ref 5–15)
BUN: 16 mg/dL (ref 6–23)
CO2: 20 mmol/L (ref 19–32)
CREATININE: 0.81 mg/dL (ref 0.50–1.10)
Calcium: 7.5 mg/dL — ABNORMAL LOW (ref 8.4–10.5)
Chloride: 105 mmol/L (ref 96–112)
GFR calc Af Amer: >90 mL/min (ref >90)
Glucose, Bld: 120 mg/dL — ABNORMAL HIGH (ref 70–99)
Potassium: 4.7 mmol/L (ref 3.5–5.1)
Sodium: 134 mmol/L — ABNORMAL LOW (ref 135–145)
Total Bilirubin: 0.2 mg/dL — ABNORMAL LOW (ref 0.3–1.2)
Total Protein: 6.1 g/dL (ref 6.0–8.3)

## 2014-09-25 LAB — RPR: RPR Ser Ql: NONREACTIVE

## 2014-09-25 LAB — MAGNESIUM: MAGNESIUM: 5.4 mg/dL — AB (ref 1.5–2.5)

## 2014-09-25 LAB — URIC ACID: Uric Acid, Serum: 4.8 mg/dL (ref 2.4–7.0)

## 2014-09-25 NOTE — Progress Notes (Signed)
Hd#2 26 6/7 days Severe HTN on labetalol/apresoline Magnesium IV BMZ #1 GBS cx(+) Reverse flow  S: No complaints.very active baby. Last BM yesterday  O:  Blood pressure 128/84, pulse 71, temperature 97.9 F (36.6 C), temperature source Oral, resp. rate 30, height 5\' 2"  (1.575 m), weight 78.926 kg (174 lb).  BP range: 130-154/83-109 Lungs clear to A  Cor RRR Abd; gravid soft Pelvic deferred Extr; no edema  Tracing: baseline130-135 No ctx  CBC Latest Ref Rng 09/25/2014 09/24/2014 03/05/2010  WBC 4.0 - 10.5 K/uL 19.4(H) 11.1(H) -  Hemoglobin 12.0 - 15.0 g/dL 78.413.3 69.613.2 17.3(H)  Hematocrit 36.0 - 46.0 % 36.7 37.0 51.0(H)  Platelets 150 - 400 K/uL 206 193 -   CMP Latest Ref Rng 09/25/2014 09/24/2014 03/05/2010  Glucose 70 - 99 mg/dL 295(M120(H) 77 841(L100(H)  BUN 6 - 23 mg/dL 16 15 12   Creatinine 0.50 - 1.10 mg/dL 2.440.81 0.100.82 1.0  Sodium 272135 - 145 mmol/L 134(L) 135 138  Potassium 3.5 - 5.1 mmol/L 4.7 4.6 3.5  Chloride 96 - 112 mmol/L 105 106 105  CO2 19 - 32 mmol/L 20 22 -  Calcium 8.4 - 10.5 mg/dL 7.5(L) 9.3 -  Total Protein 6.0 - 8.3 g/dL 6.1 6.7 -  Total Bilirubin 0.3 - 1.2 mg/dL 5.3(G0.2(L) 0.4 -  Alkaline Phos 39 - 117 U/L 80 80 -  AST 0 - 37 U/L 17 13 -  ALT 0 - 35 U/L 26 25 -   IMP: severe chronic HTn better on current regimen with nl labs IUP@ 26 6/7 weeks On Magnesium GBS cx (+) leucocytosis prob 2nd to  BMZ Reverse flow  P) cont magnesium until 2nd dose BMZ given Disc again the recommendation for genetic testing. Couple to decide. Cont BP meds Check dopplers Monday Close fetal monitoring Advised of (+) GBS

## 2014-09-26 MED ORDER — SODIUM CHLORIDE 0.9 % IJ SOLN
3.0000 mL | Freq: Two times a day (BID) | INTRAMUSCULAR | Status: DC
  Administered 2014-09-26 (×2): 3 mL via INTRAVENOUS

## 2014-09-26 NOTE — Progress Notes (Signed)
Dr Cherly Hensenousins notified of fetal heart rate tracing.  No interventions needed at this time.

## 2014-09-26 NOTE — Plan of Care (Signed)
Problem: Phase II Progression Outcomes Goal: Progress activity as tolerated unless otherwise ordered Outcome: Progressing Pt has bathroom privileges.

## 2014-09-26 NOTE — Plan of Care (Signed)
Problem: Phase II Progression Outcomes Goal: Other Phase II Outcomes/Goals Outcome: Completed/Met Date Met:  09/26/14 Handout on preeclampsia given.  Pt and family reading material.

## 2014-09-26 NOTE — Progress Notes (Signed)
HD#3 27 weeks Severe chronic HTN on labetalol/apresoline Magnesium d/c this am BMZ complete GBS cx (+)  S: no complaints. Denies PIH warning signs Active fetus  Multiple ? Answered regarding mgmt and length of stay  O: BP 133-144/73-106 Filed Vitals:   09/26/14 0156 09/26/14 0300 09/26/14 0403 09/26/14 0600  BP:   125/73   Pulse:   78   Temp:   98 F (36.7 C)   TempSrc:   Oral   Resp: 16 16 16 20   Height:      Weight:       Lungs clear to A Cor RRR abd gravid soft nontender Ext no edema PIH labs nl Tracing: baseline 140 NR no ctx  IMP: severe HTN on labetalol/apresoline relatively stable BP in comparison to admit  S/P magnesium BMZ complete Abnl fetal testing( RV flow) GBS cx(+)  P) cont current BP meds. Doppler studies in MFM  on Monday again reiterate need for inpt status until delivery

## 2014-09-27 ENCOUNTER — Inpatient Hospital Stay (HOSPITAL_COMMUNITY): Payer: BC Managed Care – PPO | Admitting: Anesthesiology

## 2014-09-27 ENCOUNTER — Encounter (HOSPITAL_COMMUNITY): Payer: Self-pay | Admitting: *Deleted

## 2014-09-27 ENCOUNTER — Inpatient Hospital Stay (HOSPITAL_COMMUNITY): Payer: BC Managed Care – PPO

## 2014-09-27 ENCOUNTER — Encounter (HOSPITAL_COMMUNITY)
Admission: AD | Disposition: A | Discharge status: Home / Self Care | Payer: Self-pay | Source: Ambulatory Visit | Attending: Obstetrics & Gynecology

## 2014-09-27 ENCOUNTER — Ambulatory Visit (HOSPITAL_COMMUNITY): Payer: BC Managed Care – PPO

## 2014-09-27 DIAGNOSIS — Z3A27 27 weeks gestation of pregnancy: Secondary | ICD-10-CM | POA: Diagnosis present

## 2014-09-27 DIAGNOSIS — Z9889 Other specified postprocedural states: Secondary | ICD-10-CM

## 2014-09-27 DIAGNOSIS — O909 Complication of the puerperium, unspecified: Secondary | ICD-10-CM | POA: Insufficient documentation

## 2014-09-27 DIAGNOSIS — IMO0002 Reserved for concepts with insufficient information to code with codable children: Secondary | ICD-10-CM | POA: Diagnosis present

## 2014-09-27 LAB — COMPREHENSIVE METABOLIC PANEL
ALBUMIN: 2.9 g/dL — AB (ref 3.5–5.2)
ALT: 24 U/L (ref 0–35)
ANION GAP: 5 (ref 5–15)
AST: 15 U/L (ref 0–37)
Alkaline Phosphatase: 67 U/L (ref 39–117)
BUN: 18 mg/dL (ref 6–23)
CO2: 22 mmol/L (ref 19–32)
Calcium: 8 mg/dL — ABNORMAL LOW (ref 8.4–10.5)
Chloride: 108 mmol/L (ref 96–112)
Creatinine, Ser: 0.71 mg/dL (ref 0.50–1.10)
GFR calc non Af Amer: >90 mL/min (ref >90)
GLUCOSE: 97 mg/dL (ref 70–99)
POTASSIUM: 4.9 mmol/L (ref 3.5–5.1)
Sodium: 135 mmol/L (ref 135–145)
Total Bilirubin: 0.1 mg/dL — ABNORMAL LOW (ref 0.3–1.2)
Total Protein: 5.9 g/dL — ABNORMAL LOW (ref 6.0–8.3)

## 2014-09-27 LAB — CBC
HCT: 35.6 % — ABNORMAL LOW (ref 36.0–46.0)
HCT: 37.4 % (ref 36.0–46.0)
HEMOGLOBIN: 12.3 g/dL (ref 12.0–15.0)
HEMOGLOBIN: 12.9 g/dL (ref 12.0–15.0)
MCH: 32 pg (ref 26.0–34.0)
MCH: 32 pg (ref 26.0–34.0)
MCHC: 34.6 g/dL (ref 30.0–36.0)
MCHC: 34.5 g/dL (ref 30.0–36.0)
MCV: 92.7 fL (ref 78.0–100.0)
MCV: 92.8 fL (ref 78.0–100.0)
PLATELETS: 186 10*3/uL (ref 150–400)
Platelets: 179 10*3/uL (ref 150–400)
RBC: 3.84 MIL/uL — ABNORMAL LOW (ref 3.87–5.11)
RBC: 4.03 MIL/uL (ref 3.87–5.11)
RDW: 13.4 % (ref 11.5–15.5)
RDW: 13.5 % (ref 11.5–15.5)
WBC: 15.7 10*3/uL — ABNORMAL HIGH (ref 4.0–10.5)
WBC: 23.9 10*3/uL — ABNORMAL HIGH (ref 4.0–10.5)

## 2014-09-27 LAB — TYPE AND SCREEN
ABO/RH(D): A POS
Antibody Screen: NEGATIVE

## 2014-09-27 LAB — URIC ACID: Uric Acid, Serum: 3.8 mg/dL (ref 2.4–7.0)

## 2014-09-27 SURGERY — Surgical Case
Anesthesia: Spinal | Site: Abdomen

## 2014-09-27 MED ORDER — CEFAZOLIN SODIUM-DEXTROSE 2-3 GM-% IV SOLR
2.0000 g | Freq: Once | INTRAVENOUS | Status: DC
  Filled 2014-09-27: qty 50

## 2014-09-27 MED ORDER — LACTATED RINGERS IV SOLN
INTRAVENOUS | Status: DC
  Administered 2014-09-27: 16:00:00 via INTRAVENOUS

## 2014-09-27 MED ORDER — SIMETHICONE 80 MG PO CHEW
80.0000 mg | CHEWABLE_TABLET | Freq: Three times a day (TID) | ORAL | Status: DC
  Administered 2014-09-27 – 2014-10-02 (×16): 80 mg via ORAL
  Filled 2014-09-27 (×16): qty 1

## 2014-09-27 MED ORDER — HYDRALAZINE HCL 20 MG/ML IJ SOLN
10.0000 mg | Freq: Once | INTRAMUSCULAR | Status: AC
  Administered 2014-09-27: 5 mg via INTRAVENOUS

## 2014-09-27 MED ORDER — ZOLPIDEM TARTRATE 5 MG PO TABS: 5.0000 mg | ORAL_TABLET | Freq: Every evening | ORAL | Status: DC | PRN

## 2014-09-27 MED ORDER — NALBUPHINE HCL 10 MG/ML IJ SOLN: 5.0000 mg | INTRAMUSCULAR | Status: DC | PRN

## 2014-09-27 MED ORDER — BUPIVACAINE LIPOSOME 1.3 % IJ SUSP
INTRAMUSCULAR | Status: DC | PRN
  Administered 2014-09-27: 20 mL

## 2014-09-27 MED ORDER — ONDANSETRON HCL 4 MG/2ML IJ SOLN: 4.0000 mg | Freq: Three times a day (TID) | INTRAMUSCULAR | Status: DC | PRN

## 2014-09-27 MED ORDER — DIBUCAINE 1 % RE OINT: 1.0000 "application " | TOPICAL_OINTMENT | RECTAL | Status: DC | PRN

## 2014-09-27 MED ORDER — PRENATAL MULTIVITAMIN CH
1.0000 | ORAL_TABLET | Freq: Every day | ORAL | Status: DC
  Administered 2014-09-27 – 2014-10-02 (×6): 1 via ORAL
  Filled 2014-09-27 (×6): qty 1

## 2014-09-27 MED ORDER — DEXAMETHASONE SODIUM PHOSPHATE 4 MG/ML IJ SOLN
INTRAMUSCULAR | Status: DC | PRN
  Administered 2014-09-27: 4 mg via INTRAVENOUS

## 2014-09-27 MED ORDER — MORPHINE SULFATE (PF) 0.5 MG/ML IJ SOLN
INTRAMUSCULAR | Status: DC | PRN
  Administered 2014-09-27: .1 mg via INTRATHECAL

## 2014-09-27 MED ORDER — LABETALOL HCL 5 MG/ML IV SOLN
INTRAVENOUS | Status: AC
  Administered 2014-09-27: 10 mg via INTRAVENOUS
  Filled 2014-09-27: qty 4

## 2014-09-27 MED ORDER — SENNOSIDES-DOCUSATE SODIUM 8.6-50 MG PO TABS
2.0000 | ORAL_TABLET | ORAL | Status: DC
  Administered 2014-09-27 – 2014-10-02 (×5): 2 via ORAL
  Filled 2014-09-27 (×5): qty 2

## 2014-09-27 MED ORDER — CITRIC ACID-SODIUM CITRATE 334-500 MG/5ML PO SOLN
30.0000 mL | Freq: Once | ORAL | Status: AC
Start: 2014-09-27 — End: 2014-09-27
  Administered 2014-09-27: 30 mL via ORAL

## 2014-09-27 MED ORDER — LANOLIN HYDROUS EX OINT: 1.0000 "application " | TOPICAL_OINTMENT | CUTANEOUS | Status: DC | PRN

## 2014-09-27 MED ORDER — MENTHOL 3 MG MT LOZG: 1.0000 | LOZENGE | OROMUCOSAL | Status: DC | PRN

## 2014-09-27 MED ORDER — CEFAZOLIN SODIUM-DEXTROSE 2-3 GM-% IV SOLR
INTRAVENOUS | Status: DC | PRN
  Administered 2014-09-27: 2 g via INTRAVENOUS

## 2014-09-27 MED ORDER — DIPHENHYDRAMINE HCL 50 MG/ML IJ SOLN: 12.5000 mg | INTRAMUSCULAR | Status: DC | PRN

## 2014-09-27 MED ORDER — OXYCODONE-ACETAMINOPHEN 5-325 MG PO TABS
2.0000 | ORAL_TABLET | ORAL | Status: DC | PRN
  Administered 2014-09-30: 1 via ORAL
  Administered 2014-09-30: 2 via ORAL
  Administered 2014-10-01: 1 via ORAL
  Administered 2014-10-01: 2 via ORAL
  Filled 2014-09-27 (×2): qty 2

## 2014-09-27 MED ORDER — NALOXONE HCL 0.4 MG/ML IJ SOLN: 0.4000 mg | INTRAMUSCULAR | Status: DC | PRN

## 2014-09-27 MED ORDER — CITRIC ACID-SODIUM CITRATE 334-500 MG/5ML PO SOLN
ORAL | Status: AC
  Filled 2014-09-27: qty 15

## 2014-09-27 MED ORDER — MORPHINE SULFATE 0.5 MG/ML IJ SOLN
INTRAMUSCULAR | Status: AC
  Filled 2014-09-27: qty 10

## 2014-09-27 MED ORDER — WITCH HAZEL-GLYCERIN EX PADS
1.0000 | MEDICATED_PAD | CUTANEOUS | Status: DC | PRN
Start: 2014-09-27 — End: 2014-10-02

## 2014-09-27 MED ORDER — ALBUTEROL SULFATE (2.5 MG/3ML) 0.083% IN NEBU: 2.5000 mg | INHALATION_SOLUTION | Freq: Four times a day (QID) | RESPIRATORY_TRACT | Status: DC | PRN

## 2014-09-27 MED ORDER — FENTANYL CITRATE (PF) 100 MCG/2ML IJ SOLN
INTRAMUSCULAR | Status: DC | PRN
  Administered 2014-09-27: 25 ug via INTRATHECAL

## 2014-09-27 MED ORDER — FERROUS SULFATE 325 (65 FE) MG PO TABS
325.0000 mg | ORAL_TABLET | Freq: Two times a day (BID) | ORAL | Status: DC
  Administered 2014-09-27 – 2014-09-28 (×3): 325 mg via ORAL
  Filled 2014-09-27 (×3): qty 1

## 2014-09-27 MED ORDER — BUPIVACAINE HCL (PF) 0.5 % IJ SOLN
INTRAMUSCULAR | Status: DC | PRN
  Administered 2014-09-27: 10 mL

## 2014-09-27 MED ORDER — HYDRALAZINE HCL 20 MG/ML IJ SOLN: 5.0000 mg | INTRAMUSCULAR | Status: DC | PRN

## 2014-09-27 MED ORDER — OXYCODONE-ACETAMINOPHEN 5-325 MG PO TABS
1.0000 | ORAL_TABLET | ORAL | Status: DC | PRN
Start: 2014-09-27 — End: 2014-10-02
  Administered 2014-09-30 – 2014-10-01 (×2): 1 via ORAL
  Filled 2014-09-27 (×4): qty 1

## 2014-09-27 MED ORDER — LACTATED RINGERS IV SOLN
INTRAVENOUS | Status: DC | PRN
  Administered 2014-09-27: 06:00:00 via INTRAVENOUS

## 2014-09-27 MED ORDER — OXYTOCIN 40 UNITS IN LACTATED RINGERS INFUSION - SIMPLE MED: 62.5000 mL/h | INTRAVENOUS | Status: AC

## 2014-09-27 MED ORDER — HYDRALAZINE HCL 20 MG/ML IJ SOLN
5.0000 mg | INTRAMUSCULAR | Status: DC | PRN
  Administered 2014-09-27: 5 mg via INTRAVENOUS

## 2014-09-27 MED ORDER — SCOPOLAMINE 1 MG/3DAYS TD PT72
1.0000 | MEDICATED_PATCH | Freq: Once | TRANSDERMAL | Status: AC
  Administered 2014-09-27: 1.5 mg via TRANSDERMAL

## 2014-09-27 MED ORDER — MEPERIDINE HCL 25 MG/ML IJ SOLN: 6.2500 mg | INTRAMUSCULAR | Status: DC | PRN

## 2014-09-27 MED ORDER — MAGNESIUM HYDROXIDE 400 MG/5ML PO SUSP
30.0000 mL | ORAL | Status: DC | PRN
  Filled 2014-09-27: qty 30

## 2014-09-27 MED ORDER — DEXAMETHASONE SODIUM PHOSPHATE 4 MG/ML IJ SOLN
INTRAMUSCULAR | Status: AC
  Filled 2014-09-27: qty 1

## 2014-09-27 MED ORDER — HYDRALAZINE HCL 20 MG/ML IJ SOLN
INTRAMUSCULAR | Status: AC
  Administered 2014-09-27: 5 mg via INTRAVENOUS
  Filled 2014-09-27: qty 1

## 2014-09-27 MED ORDER — TETANUS-DIPHTH-ACELL PERTUSSIS 5-2.5-18.5 LF-MCG/0.5 IM SUSP
0.5000 mL | Freq: Once | INTRAMUSCULAR | Status: AC
  Administered 2014-09-28: 0.5 mL via INTRAMUSCULAR
  Filled 2014-09-27: qty 0.5

## 2014-09-27 MED ORDER — DIPHENHYDRAMINE HCL 25 MG PO CAPS: 25.0000 mg | ORAL_CAPSULE | Freq: Four times a day (QID) | ORAL | Status: DC | PRN

## 2014-09-27 MED ORDER — HYDRALAZINE HCL 10 MG PO TABS
10.0000 mg | ORAL_TABLET | Freq: Three times a day (TID) | ORAL | Status: DC
  Administered 2014-09-27 – 2014-09-28 (×3): 10 mg via ORAL
  Filled 2014-09-27 (×7): qty 1

## 2014-09-27 MED ORDER — MAGNESIUM SULFATE BOLUS VIA INFUSION
4.0000 g | Freq: Once | INTRAVENOUS | Status: AC
  Administered 2014-09-27: 4 g via INTRAVENOUS
  Filled 2014-09-27: qty 500

## 2014-09-27 MED ORDER — SCOPOLAMINE 1 MG/3DAYS TD PT72
MEDICATED_PATCH | TRANSDERMAL | Status: AC
  Filled 2014-09-27: qty 1

## 2014-09-27 MED ORDER — IBUPROFEN 600 MG PO TABS
600.0000 mg | ORAL_TABLET | Freq: Four times a day (QID) | ORAL | Status: DC
  Administered 2014-09-27 – 2014-10-02 (×21): 600 mg via ORAL
  Filled 2014-09-27 (×22): qty 1

## 2014-09-27 MED ORDER — NALBUPHINE HCL 10 MG/ML IJ SOLN: 5.0000 mg | Freq: Once | INTRAMUSCULAR | Status: AC | PRN

## 2014-09-27 MED ORDER — ONDANSETRON HCL 4 MG/2ML IJ SOLN
INTRAMUSCULAR | Status: DC | PRN
  Administered 2014-09-27: 4 mg via INTRAVENOUS

## 2014-09-27 MED ORDER — BUPIVACAINE LIPOSOME 1.3 % IJ SUSP
Freq: Once | INTRAMUSCULAR | Status: DC
  Filled 2014-09-27 (×3): qty 20

## 2014-09-27 MED ORDER — LACTATED RINGERS IV SOLN
40.0000 [IU] | INTRAVENOUS | Status: DC | PRN
Start: 2014-09-27 — End: 2014-09-27
  Administered 2014-09-27: 40 [IU] via INTRAVENOUS

## 2014-09-27 MED ORDER — BUPIVACAINE HCL (PF) 0.5 % IJ SOLN
INTRAMUSCULAR | Status: AC
Start: 2014-09-27 — End: 2014-09-27
  Filled 2014-09-27: qty 30

## 2014-09-27 MED ORDER — HYDROMORPHONE HCL 1 MG/ML IJ SOLN: 0.2500 mg | INTRAMUSCULAR | Status: DC | PRN

## 2014-09-27 MED ORDER — FENTANYL CITRATE (PF) 100 MCG/2ML IJ SOLN
INTRAMUSCULAR | Status: AC
  Filled 2014-09-27: qty 2

## 2014-09-27 MED ORDER — ACETAMINOPHEN 325 MG PO TABS
650.0000 mg | ORAL_TABLET | ORAL | Status: DC | PRN
  Administered 2014-10-01: 650 mg via ORAL
  Filled 2014-09-27: qty 2

## 2014-09-27 MED ORDER — ONDANSETRON HCL 4 MG/2ML IJ SOLN
INTRAMUSCULAR | Status: AC
  Filled 2014-09-27: qty 4

## 2014-09-27 MED ORDER — SIMETHICONE 80 MG PO CHEW: 80.0000 mg | CHEWABLE_TABLET | ORAL | Status: DC | PRN

## 2014-09-27 MED ORDER — OXYTOCIN 10 UNIT/ML IJ SOLN
INTRAMUSCULAR | Status: AC
  Filled 2014-09-27: qty 4

## 2014-09-27 MED ORDER — SODIUM CHLORIDE 0.9 % IJ SOLN: 3.0000 mL | INTRAMUSCULAR | Status: DC | PRN

## 2014-09-27 MED ORDER — PHENYLEPHRINE 8 MG IN D5W 100 ML (0.08MG/ML) PREMIX OPTIME
INJECTION | INTRAVENOUS | Status: AC
  Filled 2014-09-27: qty 100

## 2014-09-27 MED ORDER — DIPHENHYDRAMINE HCL 25 MG PO CAPS: 25.0000 mg | ORAL_CAPSULE | ORAL | Status: DC | PRN

## 2014-09-27 MED ORDER — ALBUTEROL SULFATE HFA 108 (90 BASE) MCG/ACT IN AERS: 2.0000 | INHALATION_SPRAY | Freq: Four times a day (QID) | RESPIRATORY_TRACT | Status: DC | PRN

## 2014-09-27 MED ORDER — MAGNESIUM SULFATE 50 % IJ SOLN
2.0000 g/h | INTRAVENOUS | Status: AC
  Filled 2014-09-27 (×2): qty 80

## 2014-09-27 MED ORDER — SIMETHICONE 80 MG PO CHEW
80.0000 mg | CHEWABLE_TABLET | ORAL | Status: DC
  Administered 2014-09-27 – 2014-10-02 (×5): 80 mg via ORAL
  Filled 2014-09-27 (×5): qty 1

## 2014-09-27 MED ORDER — ACETAMINOPHEN 500 MG PO TABS
1000.0000 mg | ORAL_TABLET | Freq: Four times a day (QID) | ORAL | Status: AC
  Administered 2014-09-27 – 2014-09-28 (×4): 1000 mg via ORAL
  Filled 2014-09-27 (×4): qty 2

## 2014-09-27 MED ORDER — NALOXONE HCL 1 MG/ML IJ SOLN: 1.0000 ug/kg/h | INTRAVENOUS | Status: DC | PRN

## 2014-09-27 MED ORDER — LABETALOL HCL 300 MG PO TABS
300.0000 mg | ORAL_TABLET | Freq: Three times a day (TID) | ORAL | Status: DC
  Administered 2014-09-27 – 2014-09-28 (×4): 300 mg via ORAL
  Filled 2014-09-27 (×4): qty 1

## 2014-09-27 SURGICAL SUPPLY — 37 items
CLAMP CORD UMBIL (MISCELLANEOUS) IMPLANT
CLOTH BEACON ORANGE TIMEOUT ST (SAFETY) ×2 IMPLANT
CONTAINER PREFILL 10% NBF 15ML (MISCELLANEOUS) IMPLANT
DRAPE SHEET LG 3/4 BI-LAMINATE (DRAPES) IMPLANT
DRSG OPSITE POSTOP 4X10 (GAUZE/BANDAGES/DRESSINGS) ×2 IMPLANT
DURAPREP 26ML APPLICATOR (WOUND CARE) ×2 IMPLANT
ELECT REM PT RETURN 9FT ADLT (ELECTROSURGICAL) ×2
ELECTRODE REM PT RTRN 9FT ADLT (ELECTROSURGICAL) ×1 IMPLANT
EXTRACTOR VACUUM M CUP 4 TUBE (SUCTIONS) IMPLANT
GLOVE BIO SURGEON STRL SZ 6.5 (GLOVE) ×2 IMPLANT
GLOVE BIOGEL PI IND STRL 7.0 (GLOVE) ×1 IMPLANT
GLOVE BIOGEL PI INDICATOR 7.0 (GLOVE) ×1
GOWN STRL REUS W/TWL LRG LVL3 (GOWN DISPOSABLE) ×4 IMPLANT
KIT ABG SYR 3ML LUER SLIP (SYRINGE) IMPLANT
LIQUID BAND (GAUZE/BANDAGES/DRESSINGS) ×2 IMPLANT
NEEDLE HYPO 22GX1.5 SAFETY (NEEDLE) ×2 IMPLANT
NEEDLE HYPO 25X5/8 SAFETYGLIDE (NEEDLE) IMPLANT
PACK C SECTION WH (CUSTOM PROCEDURE TRAY) ×2 IMPLANT
PAD OB MATERNITY 4.3X12.25 (PERSONAL CARE ITEMS) ×2 IMPLANT
RTRCTR C-SECT PINK 25CM LRG (MISCELLANEOUS) ×2 IMPLANT
SUT MNCRL AB 3-0 PS2 27 (SUTURE) ×2 IMPLANT
SUT MON AB 4-0 PS1 27 (SUTURE) IMPLANT
SUT PLAIN 0 NONE (SUTURE) IMPLANT
SUT PLAIN 2 0 (SUTURE) ×1
SUT PLAIN ABS 2-0 CT1 27XMFL (SUTURE) ×1 IMPLANT
SUT VIC AB 0 CT1 27 (SUTURE) ×2
SUT VIC AB 0 CT1 27XBRD ANBCTR (SUTURE) ×2 IMPLANT
SUT VIC AB 0 CTX 36 (SUTURE) ×2
SUT VIC AB 0 CTX36XBRD ANBCTRL (SUTURE) ×2 IMPLANT
SUT VIC AB 2-0 CT1 27 (SUTURE) ×1
SUT VIC AB 2-0 CT1 TAPERPNT 27 (SUTURE) ×1 IMPLANT
SUT VIC AB 3-0 SH 27 (SUTURE)
SUT VIC AB 3-0 SH 27X BRD (SUTURE) IMPLANT
SUT VIC AB 4-0 PS2 27 (SUTURE) IMPLANT
SYR CONTROL 10ML LL (SYRINGE) ×2 IMPLANT
TOWEL OR 17X24 6PK STRL BLUE (TOWEL DISPOSABLE) ×2 IMPLANT
TRAY FOLEY CATH SILVER 14FR (SET/KITS/TRAYS/PACK) ×2 IMPLANT

## 2014-09-27 NOTE — Progress Notes (Signed)
UR chart review completed.  

## 2014-09-27 NOTE — Progress Notes (Addendum)
Patient ID: Elizabeth Yang, female   DOB: 1980/10/12, 34 y.o.   MRN: [id number] Subjective: POD# 0 S/P Cesarean Delivery d/t CHTN/Superimposed PIH / Non-reassuring FHR monitoring, reversed doppler flow  Information for the patient's newborn:  Elizabeth Yang, Boy Ritu [[id number]][[id number]]  female  / circ infant in NICU  Reports feeling ok.  A little tired while on magnesium sulfate. Excited about getting ready to go to NICU to see baby. Feeding: bottle feeding with pumped colostrum Patient reports tolerating PO.  Breast symptoms: none Pain controlled with ibuprofen (OTC) and narcotic analgesics including Percocet Denies HA/SOB/C/P/N/V/dizziness. Flatus present. No BM since surgery, but had a BM just prior to surgery this AM. She reports vaginal bleeding as normal, without clots.  She is ambulating, urinating without difficult.     Objective:   VS:  Filed Vitals:   09/27/14 1338 09/27/14 1426 09/27/14 1500 09/27/14 1600  BP:  124/85 137/90 140/88  Pulse: 67  66 82  Temp: 98.3 F (36.8 C)     TempSrc: Oral     Resp:      Height:      Weight:      SpO2: 99%  98% 98%     Intake/Output Summary (Last 24 hours) at 09/27/14 1638 Last data filed at 09/27/14 1600  Gross per 24 hour  Intake 1959.49 ml  Output   1950 ml  Net   9.49 ml  Output in last hour 75 mL/hr   Recent Labs  09/27/14 0430 09/27/14 0945  WBC 15.7* 23.9*  HGB 12.3 12.9  HCT 35.6* 37.4  PLT 179 186     Blood type: A POS (04/25 0430)  Rubella:  Immune     Physical Exam:  General: alert, cooperative, fatigued and no distress CV: Regular rate and rhythm, S1S2 present or without murmur or extra heart sounds Resp: clear Abdomen: soft, nontender, normal bowel sounds Incision: clean, dry, intact and skin well-approximated with sutures; no erythema,ecchymosis or edema Uterine Fundus: firm, 1 FB below umbilicus, nontender Lochia: minimal Ext: extremities normal, atraumatic, no cyanosis or edema, Homans sign is  negative, no sign of DVT and no edema, redness or tenderness in the calves or thighs  Assessment/Plan: 34 y.o.   POD# 0.  s/p Cesarean Delivery.  Indications: Premature IUP @ 27.1 wks / CHTN / Superimposed PIH / non-reassuring FHR                Principal Problem:   Postpartum care following cesarean delivery (4/25) Active Problems:   Elevated blood pressure affecting pregnancy in second trimester, antepartum   Preeclampsia, severe   Fetal growth restriction   [redacted] weeks gestation of pregnancy   Postoperative state  Doing well, stable.               Regular diet as tolerated Continue Magnesium 2 gram/hr Continue Labetalol 300 mg po TID Continue Apresoline 10 mg po TID Strict I&O Ambulate Routine post-op care  *Dr. Billy Coastaavon consulted on assessment and decrease in urine output - recommend continuation of current plan of care; if urine output and BP improved, may d/c magnesium at 24 hrs  Kenard GowerAWSON, Briyana Badman, M, MSN, CNM 09/27/2014, 4:38 PM

## 2014-09-27 NOTE — Lactation Note (Signed)
This note was copied from the chart of Elizabeth Yang. Lactation Consultation Note  Patient Name: Elizabeth Yang YNWGN'FToday's Date: 09/27/2014 Reason for consult: Initial assessment;NICU baby;Infant < 6lbs    27 1/[redacted] weeks gestation, IUGR, weighing 1 lb 6.6 oz, and now 8 hours old. I set up DEP, had mom pump in premie setting, and showed her how to hand express. She was able to express a large drop of colostrum, which dad brought to the NICU. I reviewed the NCU booklet on how to provide EBm for a NICU baby with mom. I decreased mom to size 21 flanges with a good fit. Mom eager to go see the baby. Mom knows to have lactation called for questions/concerns.    Maternal Data Formula Feeding for Exclusion: Yes (baby in NICU and mom in AICU) Reason for exclusion: Admission to Intensive Care Unit (ICU) post-partum Has patient been taught Hand Expression?: Yes Does the patient have breastfeeding experience prior to this delivery?: No  Feeding    LATCH Score/Interventions                      Lactation Tools Discussed/Used WIC Program: No (personal insurance) Pump Review: Setup, frequency, and cleaning;Milk Storage;Other (comment) (premie setting, hand expression) Initiated by:: clee rn IBCLC Date initiated:: 09/27/14   Consult Status Consult Status: Follow-up Date: 09/28/14 Follow-up type: In-patient    Alfred LevinsLee, Shawna Kiener Anne 09/27/2014, 4:01 PM

## 2014-09-27 NOTE — Progress Notes (Signed)
Hospital day # 3 pregnancy at 13109w1d  BMethasone/MgSO4 completed/GBS pos                                                                PIH controled on Labetalol/Apresoline.  No Prtnuria, normal PIH labs                                                                SGA with abnormal dopplers/Echogenic bowels/Abnormal AFP1                                                                No Chromosomal analysis done so far Called because of a prolonged variable deceleration x 2-3 min at 100/min at the lowest point.  Resolved in left decubitus.  Had a few 1 min similar decelerations in PM yesterday which tend to happen when patient is flat on her back. FHR monitoring 135-140/min base line with variability present and very mild variable decelerations otherwise unchanged. No PEC Sx.  No UC.  No vaginal bleeding. Ate a pudding at 10:30 PM (4 1/2 hr ago)  O: BP 143/83 mmHg  Pulse 74  Temp(Src) 98.5 F (36.9 C) (Oral)  Resp 20  Ht 5\' 2"  (1.575 m)  Wt 179 lb (81.194 kg)  BMI 32.73 kg/m2      Uterus soft, non-tender       BPP at bed side:  6/8  Tone, fetal movements and AFI wnl.  No Breathing movement seen.  Back on monitoring:  130-140/min with variability present and no significant deceleration currently.   A: 33109w1d with PIH stable on 2 meds.  No criteria for PEC.  SGA/Abnormal dopplers/Echogenic bowels/Abnormal AFP1     BPP 6/8, FHR monitoring stable on left decubitus.       P: Continue continuous monitoring.  Following very closely.  If recurrent deep prolonged variable decelerations, will proceed with urgent C/S given unfavorable cervix.  Reasons do proceed with Amnio discussed, patient and husband will consider in am at MFM visit for repeat Dopplers.  Elizabeth Yang,MARIE-LYNE  MD 09/27/2014 3:07 AM

## 2014-09-27 NOTE — Anesthesia Postprocedure Evaluation (Signed)
  Anesthesia Post-op Note  Patient: Elizabeth Yang  Procedure(s) Performed: Procedure(s): CESAREAN SECTION (N/A)  Patient Location: PACU  Anesthesia Type:Spinal  Level of Consciousness: awake, alert  and oriented  Airway and Oxygen Therapy: Patient Spontanous Breathing  Post-op Pain: none  Post-op Assessment: Post-op Vital signs reviewed, Respiratory Function Stable, Patent Airway and No signs of Nausea or vomiting  Post-op Vital Signs: Reviewed and stable  Last Vitals:  Filed Vitals:   09/27/14 0645  BP:   Pulse:   Temp:   Resp: 16    Complications: No apparent anesthesia complications Chronic HTN, will give Hydralazine and Labetolol.  Also will be on MgSO4

## 2014-09-27 NOTE — Anesthesia Preprocedure Evaluation (Signed)
Anesthesia Evaluation  Patient identified by MRN, date of birth, ID band Patient awake    Reviewed: Allergy & Precautions, H&P , Patient's Chart, lab work & pertinent test results  Airway Mallampati: II  TM Distance: >3 FB Neck ROM: full    Dental no notable dental hx.    Pulmonary  breath sounds clear to auscultation  Pulmonary exam normal       Cardiovascular Exercise Tolerance: Good hypertension, Rhythm:regular Rate:Normal     Neuro/Psych    GI/Hepatic   Endo/Other    Renal/GU      Musculoskeletal   Abdominal   Peds  Hematology   Anesthesia Other Findings Elevated blood pressure affecting pregnancy in second trimester, antepartum, Normal platelets    Reproductive/Obstetrics                             Anesthesia Physical Anesthesia Plan  ASA: II and emergent  Anesthesia Plan: Spinal   Post-op Pain Management:    Induction:   Airway Management Planned:   Additional Equipment:   Intra-op Plan:   Post-operative Plan:   Informed Consent: I have reviewed the patients History and Physical, chart, labs and discussed the procedure including the risks, benefits and alternatives for the proposed anesthesia with the patient or authorized representative who has indicated his/her understanding and acceptance.   Dental Advisory Given  Plan Discussed with: CRNA  Anesthesia Plan Comments: (Lab work confirmed with CRNA in room. Platelets okay. Discussed spinal anesthetic, and patient consents to the procedure:  included risk of possible headache,backache, failed block, allergic reaction, and nerve injury. This patient was asked if she had any questions or concerns before the procedure started. )        Anesthesia Quick Evaluation

## 2014-09-27 NOTE — Op Note (Signed)
Preoperative diagnosis: Intrauterine pregnancy at 27 weeks and 1 day                                            CHTN/Superimposed PIH                                            Non-reassuring FHR monitoring, reversed doppler flow   Post operative diagnosis: Same  Anesthesia: Spinal  Anesthesiologist: Dr. Cristela BlueKyle Jackson  Procedure: Primary urgent low transverse cesarean section  Surgeon: Dr. Genia DelMarie-Lyne Keven Soucy  Assistant: None   Estimated blood loss: 500 cc  Procedure:  After being informed of the planned procedure and possible complications including bleeding, infection, injury to other organs, informed consent is obtained. The patient is taken to OR #9 and given spinal anesthesia without complication. She is placed in the dorsal decubitus position with the pelvis tilted to the left. She is then prepped and draped in a sterile fashion. A Foley catheter is inserted in her bladder.  After assessing adequate level of anesthesia, we perform a Pfannenstiel incision which is brought down sharply to the fascia. The fascia is entered in a low transverse fashion. Linea alba is dissected. Peritoneum is entered in a midline fashion. An Alexis retractor is easily positioned. Visceral peritoneum is entered in a low transverse fashion allowing us to safely retract bladder by developing a bladder flap.  The myometrium is then entered in a low transverse fashion; first with knife and then extended bluntly. Amniotic fluid is clear. We assist the birth of a female  infant in cephalic presentation. Mouth and nose are suctioned. The baby is delivered. The cord is clamped and sectioned. The baby is given to the neonatologist present in the room.  A cord PH is done, which comes back at 7.065. 10 cc of blood is drawn from the umbilical vein.The placenta is allowed to deliver spontaneously. It is complete and the cord has 3 vessels. Uterine revision is negative.  We proceed with closure of the myometrium in 2 layers:  First with a running locked suture of 0 Vicryl, then with a Lembert suture of 0 Vicryl imbricating the first one. Hemostasis is completed with cauterization on peritoneal edges.  Both paracolic gutters are cleaned. Both tubes and ovaries are assessed and normal.  We confirm a satisfactory hemostasis.  Retractors and sponges are removed. Under fascia hemostasis is completed with cauterization.  The peritoneal peritoneum is closed with a running Vicryl 2-0.  The fascia is then closed with 2 running sutures of 0 Vicryl meeting midline. Hemostasis is completed with cauterization.  The adipose tissue is reapproximated with a Plain 2-0.  The skin is closed with a subcuticular suture of 3-0 Monocryl and Dermabond is added.  A Honeycomb dressing is applied.  Instrument and sponge count is complete x2. Estimated blood loss is 500 cc.  The procedure is well tolerated by the patient who is taken to recovery room in a well and stable condition.  female baby was born at 5:30 am and received an Apgar of 7  at 1 minute and 8 at 5 minutes.  PH is 7.065.  Weight was pending.    Specimen: Placenta sent to Patho.   Laterrian Hevener,MARIE-LYNE MD 4/25/20166:31 AM

## 2014-09-27 NOTE — Anesthesia Procedure Notes (Signed)
Spinal Patient location during procedure: OR Preanesthetic Checklist Completed: patient identified, site marked, surgical consent, pre-op evaluation, timeout performed, IV checked, risks and benefits discussed and monitors and equipment checked Spinal Block Patient position: sitting Prep: DuraPrep Patient monitoring: heart rate, cardiac monitor, continuous pulse ox and blood pressure Approach: midline Location: L3-4 Injection technique: single-shot Needle Needle type: Sprotte  Needle gauge: 24 G Needle length: 9 cm Assessment Sensory level: T4 Additional Notes Spinal Dosage in OR  Bupivicaine ml       1.3 PFMS04   mcg        100 Fentanyl mcg            25    

## 2014-09-27 NOTE — Transfer of Care (Signed)
Immediate Anesthesia Transfer of Care Note  Patient: Elizabeth Yang  Procedure(s) Performed: Procedure(s): CESAREAN SECTION (N/A)  Patient Location: PACU  Anesthesia Type:Spinal  Level of Consciousness: awake, alert  and oriented  Airway & Oxygen Therapy: Patient Spontanous Breathing  Post-op Assessment: Report given to RN and Post -op Vital signs reviewed and stable  Post vital signs: Reviewed and stable  Last Vitals:  Filed Vitals:   09/27/14 0633  BP:   Pulse: 67  Temp: 36.8 C  Resp: 16    Complications: No apparent anesthesia complications

## 2014-09-27 NOTE — Progress Notes (Signed)
Monitoring of FHR followed closely since last assessment:  FHR base line in 140's with decreased variability and recurrent prolonged moderate to severe decelerations.  Non-reassuring FHR monitoring findings discussed with patient and husband.  Given the deterioration of the FHR monitoring, with the known reversed flow on the doppler with completed BMethasone and MgSO4 at 27+ wks with cHTN, superimposed PIH, unfavorable cervix and abnormal AFP1, echogenic bowels, SGA, but unknown chromosomes, the decision was taken to proceed with urgent C/S. Informed consent was obtained.  Patient and husband agree with plan. Stat CBC.  Ancef 2 g IV.  Case posted.  NPO x 6 hrs (pudding), last meal 8 hrs ago.  Genia DelMarie-Lyne Mikalia Fessel MD  09/27/2014 at 4:37 am.

## 2014-09-27 NOTE — Progress Notes (Signed)
IV fluids hooked up with LR prior to surgery.

## 2014-09-28 ENCOUNTER — Encounter (HOSPITAL_COMMUNITY): Payer: Self-pay | Admitting: Obstetrics & Gynecology

## 2014-09-28 LAB — ANTIPHOSPHOLIPID SYNDROME EVAL, BLD
Anticardiolipin IgG: <9 GPL U/mL (ref 0–14)
DRVVT: 37.8 s (ref 0.0–55.1)
PHOSPHATYDALSERINE, IGG: 2 {GPS'U} (ref 0–11)
PTT LA: 35.4 s (ref 0.0–50.0)
Phosphatydalserine, IgA: <1 APS IgA (ref 0–20)
Phosphatydalserine, IgM: 8 MPS IgM (ref 0–25)

## 2014-09-28 LAB — CBC WITH DIFFERENTIAL/PLATELET
Basophils Absolute: 0 10*3/uL (ref 0.0–0.1)
Basophils Relative: 0 % (ref 0–1)
Eosinophils Absolute: 0 10*3/uL (ref 0.0–0.7)
Eosinophils Relative: 0 % (ref 0–5)
HCT: 33.5 % — ABNORMAL LOW (ref 36.0–46.0)
Hemoglobin: 11.3 g/dL — ABNORMAL LOW (ref 12.0–15.0)
Lymphocytes Relative: 10 % — ABNORMAL LOW (ref 12–46)
Lymphs Abs: 1.3 10*3/uL (ref 0.7–4.0)
MCH: 31.5 pg (ref 26.0–34.0)
MCHC: 33.7 g/dL (ref 30.0–36.0)
MCV: 93.3 fL (ref 78.0–100.0)
Monocytes Absolute: 1.2 10*3/uL — ABNORMAL HIGH (ref 0.1–1.0)
Monocytes Relative: 9 % (ref 3–12)
Neutro Abs: 11 10*3/uL — ABNORMAL HIGH (ref 1.7–7.7)
Neutrophils Relative %: 81 % — ABNORMAL HIGH (ref 43–77)
Platelets: 154 10*3/uL (ref 150–400)
RBC: 3.59 MIL/uL — ABNORMAL LOW (ref 3.87–5.11)
RDW: 13.8 % (ref 11.5–15.5)
WBC: 13.5 10*3/uL — ABNORMAL HIGH (ref 4.0–10.5)

## 2014-09-28 LAB — COMPREHENSIVE METABOLIC PANEL
ALT: 24 U/L (ref 0–35)
AST: 16 U/L (ref 0–37)
Albumin: 2.8 g/dL — ABNORMAL LOW (ref 3.5–5.2)
Alkaline Phosphatase: 57 U/L (ref 39–117)
Anion gap: 6 (ref 5–15)
BUN: 24 mg/dL — ABNORMAL HIGH (ref 6–23)
CO2: 24 mmol/L (ref 19–32)
Calcium: 7.3 mg/dL — ABNORMAL LOW (ref 8.4–10.5)
Chloride: 105 mmol/L (ref 96–112)
Creatinine, Ser: 0.94 mg/dL (ref 0.50–1.10)
GFR calc Af Amer: >90 mL/min (ref >90)
GFR calc non Af Amer: 79 mL/min — ABNORMAL LOW (ref >90)
Glucose, Bld: 96 mg/dL (ref 70–99)
Potassium: 4.9 mmol/L (ref 3.5–5.1)
Sodium: 135 mmol/L (ref 135–145)
Total Bilirubin: 0.1 mg/dL — ABNORMAL LOW (ref 0.3–1.2)
Total Protein: 5.7 g/dL — ABNORMAL LOW (ref 6.0–8.3)

## 2014-09-28 LAB — URIC ACID: Uric Acid, Serum: 4.1 mg/dL (ref 2.4–7.0)

## 2014-09-28 MED ORDER — HYDRALAZINE HCL 20 MG/ML IJ SOLN
5.0000 mg | Freq: Four times a day (QID) | INTRAMUSCULAR | Status: DC | PRN
  Administered 2014-09-28 – 2014-10-01 (×3): 5 mg via INTRAVENOUS
  Filled 2014-09-28 (×3): qty 1

## 2014-09-28 MED ORDER — SODIUM CHLORIDE 0.9 % IJ SOLN
3.0000 mL | Freq: Two times a day (BID) | INTRAMUSCULAR | Status: DC
  Administered 2014-09-28 – 2014-10-01 (×5): 3 mL via INTRAVENOUS

## 2014-09-28 MED ORDER — NIFEDIPINE ER 30 MG PO TB24
30.0000 mg | ORAL_TABLET | Freq: Every day | ORAL | Status: DC
  Administered 2014-09-28 – 2014-09-30 (×3): 30 mg via ORAL
  Filled 2014-09-28 (×3): qty 1

## 2014-09-28 MED ORDER — HYDRALAZINE HCL 20 MG/ML IJ SOLN: 5.0000 mg | Freq: Once | INTRAMUSCULAR | Status: DC

## 2014-09-28 MED ORDER — LABETALOL HCL 200 MG PO TABS
400.0000 mg | ORAL_TABLET | Freq: Three times a day (TID) | ORAL | Status: DC
  Administered 2014-09-28 – 2014-10-01 (×9): 400 mg via ORAL
  Filled 2014-09-28 (×10): qty 2

## 2014-09-28 NOTE — Progress Notes (Signed)
Pt notes pain well controlled with po meds, Has been up and walked to ICU to see baby (intubated, chromosomes pending). Normal void, + flatus, tol reg po. Pt notes no HA, no vision change, no RUQ pain.  Filed Vitals:   09/28/14 1100 09/28/14 1200 09/28/14 1210 09/28/14 1435  BP: 158/89 145/95  156/92  Pulse: 78 81 82 72  Temp:   98.6 F (37 C)   TempSrc:   Oral   Resp: 18  18 18   Height:      Weight:      SpO2: 100% 98% 98% 99%   Lying in bed comfortable but sleep  CBC    Component Value Date/Time   WBC 13.5* 09/28/2014 0704   RBC 3.59* 09/28/2014 0704   HGB 11.3* 09/28/2014 0704   HCT 33.5* 09/28/2014 0704   PLT 154 09/28/2014 0704   MCV 93.3 09/28/2014 0704   MCH 31.5 09/28/2014 0704   MCHC 33.7 09/28/2014 0704   RDW 13.8 09/28/2014 0704   LYMPHSABS 1.3 09/28/2014 0704   MONOABS 1.2* 09/28/2014 0704   EOSABS 0.0 09/28/2014 0704   BASOSABS 0.0 09/28/2014 0704    CMP     Component Value Date/Time   NA 135 09/28/2014 0704   K 4.9 09/28/2014 0704   CL 105 09/28/2014 0704   CO2 24 09/28/2014 0704   GLUCOSE 96 09/28/2014 0704   BUN 24* 09/28/2014 0704   CREATININE 0.94 09/28/2014 0704   CALCIUM 7.3* 09/28/2014 0704   PROT 5.7* 09/28/2014 0704   ALBUMIN 2.8* 09/28/2014 0704   AST 16 09/28/2014 0704   ALT 24 09/28/2014 0704   ALKPHOS 57 09/28/2014 0704   BILITOT 0.1* 09/28/2014 0704   GFRNONAA 79* 09/28/2014 0704   GFRAA >90 09/28/2014 0704    A/P: POD #1 s/p urgent c/s for NRFHT in setting of reversed EDF and IUGR. Chronic htn with acute worsening bps but no other lab/ exam evidence PEC. - htn. Increase in bp this am after magnesium stopped, hydralizine stopped, pt now on q 8hr labetalol and daily procardia. Stable labs/ exam. Cont close monitoring - dispo- ICU until bp stability ensured then tx to 3rd floor, probable d/c on POD #3 - baby in NICU - long term management- d/w pt APA testing, endocrine eval of htn (esp in setting of high urine output w/ low urine  protein), baby ASA with next preg.   Jazzlyn Huizenga A. 09/28/2014 3:20 PM

## 2014-09-28 NOTE — Progress Notes (Signed)
POSTOPERATIVE DAY # 1 S/P cesarean   S:         Reports feeling really tired / no PIH symptoms             Tolerating po intake / no nausea / no vomiting / some flatus / no BM             Bleeding is light             Pain controlled with long-acting narcotic             Up ad lib / ambulatory/ voiding QS  Newborn stable in NICU - patient visiting prn  O:  VS: BP 155/93 mmHg  Pulse 69  Temp(Src) 98.8 F (37.1 C) (Oral)  Resp 18  Ht  (1.575 m)  Wt 82.146 kg (181 lb 1.6 oz)  BMI 33.12 kg/m2  SpO2 99%  Breastfeeding? Unknown               BP: 158/106 - 152/106 -155/93 - 149/99 - 143/90 - 141/94 - 120/86 - 131/90 - 138/96   LABS: no labs ordered this am / previous labs (4/25) normal LE and stable uric acid                         Leukocytosis yesterday with WBC to 23K (drawn post-op 5hr)              Recent Labs  09/27/14 0430 09/27/14 0945  WBC 15.7* 23.9*  HGB 12.3 12.9  PLT 179 186               Bloodtype: --/--/A POS (04/25 0430)  Rubella:     Immune                                         I&O: net +              Weight: up 5 pounds from yesterday              Magnesium DC at 0700 this am per order from last night              Physical Exam:             Alert and Oriented X3  Lungs: Clear and unlabored  Heart: regular rate and rhythm / no mumurs  Abdomen: soft, non-tender, mildly distended, hypoactive BS             Fundus: firm, non-tender, U-2             Dressing intact honeycomb              Incision:  approximated with suture / no erythema / no ecchymosis / no drainage  Lochia: light to scant on peri-pad  Extremities: trace edema, no calf pain or tenderness, negative Homans  A:        POD # 1 S/P cesarean section at 27 weeks / chronic HTN with superimposed PIH disorder            Uncontrolled hypertension - Labetalol 300 TID po / Hydralazine  po Q8            Hx adrenal disfunction  P:        Routine postoperative care   Hydralazine  IV Q6 hrs for diastolic over 100  Labs this am - POD 1: CBCD / CMP / Uric Acid            Dr Billy Coastaavon updated with current status - off magnesium at 0700 (order placed last night) / change in status this am & remains in ICU            Will update again after labs reviewed & recheck BP status after IV apresoline     Marlinda MikeBAILEY, Jasan Doughtie CNM, MSN, Asa LenteFACNM 09/28/2014, [id number]

## 2014-09-28 NOTE — Anesthesia Postprocedure Evaluation (Signed)
  Anesthesia Post-op Note  Patient: Elizabeth Yang  Procedure(s) Performed: Procedure(s): CESAREAN SECTION (N/A)  Patient Location: AICU  Anesthesia Type:SPINAL  Level of Consciousness: awake, alert  and oriented  Airway and Oxygen Therapy: Patient Spontanous Breathing  Post-op Pain: none  Post-op Assessment: Post-op Vital signs reviewed  Post-op Vital Signs: Reviewed and stable  Last Vitals:  Filed Vitals:   09/28/14 0200  BP: 141/94  Pulse: 73  Temp:   Resp: 16    Complications: No apparent anesthesia complications

## 2014-09-28 NOTE — Progress Notes (Signed)
TC from nurse with test results and BP status  BP remain elevated despite IV hydralazine dose ~ 1 hour ago / Labetalol 364m dose given in past 1/2 hour  BP: 169/105 - 165/103 - 154/103  Labs:  Results for Elizabeth Yang, Elizabeth Yang(MRN [id number] as of 09/28/2014 11:31  Ref. Range 09/28/2014 07:04  Sodium Latest Ref Range: 135-145 mmol/L 135  Potassium Latest Ref Range: 3.5-5.1 mmol/L 4.9  Chloride Latest Ref Range: 96-112 mmol/L 105  CO2 Latest Ref Range: 19-32 mmol/L 24  BUN Latest Ref Range: 6-23 mg/dL 24 (H)  Creatinine Latest Ref Range: 0.50-1.10 mg/dL 0.94  Calcium Latest Ref Range: 8.4-10.5 mg/dL 7.3 (L)  EGFR (Non-African Amer.) Latest Ref Range: >90 mL/min 79 (L)  EGFR (African American) Latest Ref Range: >90 mL/min >90  Glucose Latest Ref Range: 70-99 mg/dL 96  Anion gap Latest Ref Range: 5-15  6  Alkaline Phosphatase Latest Ref Range: 39-117 U/L 57  Albumin Latest Ref Range: 3.5-5.2 g/dL 2.8 (L)  Uric Acid, Serum Latest Ref Range: 2.4-7.0 mg/dL 4.1  AST Latest Ref Range: 0-37 U/L 16  ALT Latest Ref Range: 0-35 U/L 24  Total Protein Latest Ref Range: 6.0-8.3 g/dL 5.7 (L)  Total Bilirubin Latest Ref Range: 0.3-1.2 mg/dL 0.1 (L)  WBC Latest Ref Range: 4.0-10.5 K/uL 13.5 (H)  RBC Latest Ref Range: 3.87-5.11 MIL/uL 3.59 (L)  Hemoglobin Latest Ref Range: 12.0-15.0 g/dL 11.3 (L)  HCT Latest Ref Range: 36.0-46.0 % 33.5 (L)  MCV Latest Ref Range: 78.0-100.0 fL 93.3  MCH Latest Ref Range: 26.0-34.0 pg 31.5  MCHC Latest Ref Range: 30.0-36.0 g/dL 33.7  RDW Latest Ref Range: 11.5-15.5 % 13.8  Platelets Latest Ref Range: 150-400 K/uL 154    Verbal consultation with Dr TRonita Hipps-  Recommend change in medications : add Procardia 30 XL now and increase Labetalol to 4078mPO TID - DC oral hydralazine  Remain in AICU until hypertension controlled Ok repeat hydralizine IV 66m27mor diastolic > 100005TC back to nurse AICU - recheck BP at [id number] diastolic remains over 100111repeat  hydralazine IV / start Procardia 30 XL now and next dose of labetalol will be increased to 400m60moral hydralazine will be discontinued Orders [place] @ 1130am

## 2014-09-28 NOTE — Lactation Note (Signed)
This note was copied from the chart of Elizabeth Yang. Lactation Consultation Note  Patient Name: Elizabeth Yang YQMVH'QToday's Date: 09/28/2014 Reason for consult: Follow-up assessment  NICU baby 32 hours of life. Mom pumping when LC entered room. Mom getting 2-3 mls of EBM. Mom return-demonstrated massage and hand expression after pumping. Enc mom to do both before and after pumping. Discussed normal progression of milk coming in. Enc mom to call her insurance company about DEBP and discussed hospital 2-week DEBP rental. Mom aware of pumping rooms in NICU. Enc mom to call for assistance as needed.  Maternal Data    Feeding    LATCH Score/Interventions                      Lactation Tools Discussed/Used Tools: Pump Breast pump type: Double-Electric Breast Pump   Consult Status Consult Status: Follow-up Date: 09/29/14 Follow-up type: In-patient    Geralynn OchsWILLIARD, Theodor Mustin 09/28/2014, 1:50 PM

## 2014-09-29 ENCOUNTER — Ambulatory Visit (HOSPITAL_COMMUNITY): Payer: BC Managed Care – PPO

## 2014-09-29 NOTE — Lactation Note (Signed)
This note was copied from the chart of Elizabeth Yang. Lactation Consultation Note  Patient Name: Elizabeth Yang ZOXWR'UToday's Date: 09/29/2014 Reason for consult: Follow-up assessment;NICU baby NICU baby 58 hours of life, 7319w3d CGA. Gave mom additional small bottles for EBM. Mom states milk starting to come in better now, and both breast are flowing well. Mom has spoken with her insurance company and they will send her a personal pump as well as cover at least part of the cost of a renting a hospital-grade pump. So mom given paperwork for a monthly rental of [facility] DEBP.  Maternal Data    Feeding    LATCH Score/Interventions                      Lactation Tools Discussed/Used     Consult Status Consult Status: Follow-up Date: 09/30/14 Follow-up type: In-patient    Elizabeth Yang, Elizabeth Yang 09/29/2014, 3:41 PM

## 2014-09-29 NOTE — Progress Notes (Signed)
CSW briefly met with [name] and FOB in order to introduce self and role of CSW in NICU team. CSW did not complete full assessment due to presentation of [name] and FOB.   [name] reported that the infant had been having a "difficult" day which has been "hard" for her to watch.  She presented as minimally ready to process how she is feeling about the admission as she declined offer to process how she is feeling.  CSW noted that the [name] was beginning to appear tearful as she reflected upon the infant's current health.  CSW provided information on how to apply for SSI since [name] qualifies.  CSW and FOB completed release of information, and CSW provided [name] and FOB with copy of infant's H&P.  The signed release of information has been placed on the infant's chart.  [name] and FOB denied additional questions about how to apply for SSI.   CSW also provided [name] and FOB with CSW contact information, and again provided information on CSW's role.  [name] and FOB agreed to reach out to CSW if needs arise while infant is in the NICU.

## 2014-09-29 NOTE — Progress Notes (Signed)
POD #2 s/p urgent c/s at 27 wks for NRFHT  S: Pt notes no HA, no vision change, no RUQ pain. Overall feel well, pain controlled w/ po meds. Increased activity.  Ceasar Mons: Filed Vitals:   09/28/14 2137 09/28/14 2328 09/29/14 0124 09/29/14 0517  BP: 154/93 145/92 137/86 142/90  Pulse: 90 84 91 77  Temp: 99.2 F (37.3 C)  99 F (37.2 C) 98.9 F (37.2 C)  TempSrc: Oral  Oral Oral  Resp: 18  16 18   Height:      Weight:    82.555 kg (182 lb)  SpO2: 98%  97% 98%   CV: RRR Pulm: CTAB Abd: soft, approp tender, ND, no RUQ pain Inc: dressed, clean GU: def LE: trace edema, NT Gen: well appearing, walking around room  CBC    Component Value Date/Time   WBC 13.5* 09/28/2014 0704   RBC 3.59* 09/28/2014 0704   HGB 11.3* 09/28/2014 0704   HCT 33.5* 09/28/2014 0704   PLT 154 09/28/2014 0704   MCV 93.3 09/28/2014 0704   MCH 31.5 09/28/2014 0704   MCHC 33.7 09/28/2014 0704   RDW 13.8 09/28/2014 0704   LYMPHSABS 1.3 09/28/2014 0704   MONOABS 1.2* 09/28/2014 0704   EOSABS 0.0 09/28/2014 0704   BASOSABS 0.0 09/28/2014 0704    CMP     Component Value Date/Time   NA 135 09/28/2014 0704   K 4.9 09/28/2014 0704   CL 105 09/28/2014 0704   CO2 24 09/28/2014 0704   GLUCOSE 96 09/28/2014 0704   BUN 24* 09/28/2014 0704   CREATININE 0.94 09/28/2014 0704   CALCIUM 7.3* 09/28/2014 0704   PROT 5.7* 09/28/2014 0704   ALBUMIN 2.8* 09/28/2014 0704   AST 16 09/28/2014 0704   ALT 24 09/28/2014 0704   ALKPHOS 57 09/28/2014 0704   BILITOT 0.1* 09/28/2014 0704   GFRNONAA 79* 09/28/2014 0704   GFRAA >90 09/28/2014 0704    A/P: POD#2, chronic htn with acute exacerbation of bp, reversal of doppler flow, probably superimposed atypical PEC given acute severity of bp. Now s/p Mag, bp under better control with new regimen- labetalol 400mg  q 8hrs adn Procardia 30XL daily. Will not make any changes to bp meds right now- higher values last night after moving around much more. Med change <24 hrs ago, will watch  today's bps carefully. - pumping, baby in NICU, guarded.   Calie Buttrey A. 09/29/2014 9:33 AM

## 2014-09-30 LAB — CBC WITH DIFFERENTIAL/PLATELET
BASOS PCT: 0 % (ref 0–1)
Basophils Absolute: 0 10*3/uL (ref 0.0–0.1)
EOS ABS: 0.1 10*3/uL (ref 0.0–0.7)
Eosinophils Relative: 1 % (ref 0–5)
HCT: 34.2 % — ABNORMAL LOW (ref 36.0–46.0)
Hemoglobin: 11.4 g/dL — ABNORMAL LOW (ref 12.0–15.0)
LYMPHS ABS: 1.6 10*3/uL (ref 0.7–4.0)
Lymphocytes Relative: 12 % (ref 12–46)
MCH: 31.7 pg (ref 26.0–34.0)
MCHC: 33.3 g/dL (ref 30.0–36.0)
MCV: 95 fL (ref 78.0–100.0)
Monocytes Absolute: 1.1 10*3/uL — ABNORMAL HIGH (ref 0.1–1.0)
Monocytes Relative: 8 % (ref 3–12)
NEUTROS ABS: 10.5 10*3/uL — AB (ref 1.7–7.7)
NEUTROS PCT: 79 % — AB (ref 43–77)
PLATELETS: 171 10*3/uL (ref 150–400)
RBC: 3.6 MIL/uL — AB (ref 3.87–5.11)
RDW: 13.7 % (ref 11.5–15.5)
WBC: 13.3 10*3/uL — AB (ref 4.0–10.5)

## 2014-09-30 LAB — COMPREHENSIVE METABOLIC PANEL
ALT: 43 U/L — ABNORMAL HIGH (ref 0–35)
AST: 22 U/L (ref 0–37)
Albumin: 3 g/dL — ABNORMAL LOW (ref 3.5–5.2)
Alkaline Phosphatase: 61 U/L (ref 39–117)
Anion gap: 6 (ref 5–15)
BUN: 23 mg/dL (ref 6–23)
CALCIUM: 9.5 mg/dL (ref 8.4–10.5)
CO2: 27 mmol/L (ref 19–32)
Chloride: 104 mmol/L (ref 96–112)
Creatinine, Ser: 0.95 mg/dL (ref 0.50–1.10)
GFR, EST NON AFRICAN AMERICAN: 78 mL/min — AB (ref >90)
Glucose, Bld: 103 mg/dL — ABNORMAL HIGH (ref 70–99)
Potassium: 4.6 mmol/L (ref 3.5–5.1)
Sodium: 137 mmol/L (ref 135–145)
TOTAL PROTEIN: 6.1 g/dL (ref 6.0–8.3)
Total Bilirubin: 0.2 mg/dL — ABNORMAL LOW (ref 0.3–1.2)

## 2014-09-30 MED ORDER — HYDRALAZINE HCL 10 MG PO TABS
10.0000 mg | ORAL_TABLET | Freq: Three times a day (TID) | ORAL | Status: DC
  Administered 2014-09-30 – 2014-10-01 (×3): 10 mg via ORAL
  Filled 2014-09-30 (×4): qty 1

## 2014-09-30 NOTE — Progress Notes (Signed)
Patient ID: Elizabeth Yang, female   DOB: [birth year], 34 y.o.   MRN: [id number] Subjective: POD# 3 S/P Cesarean Delivery d/t fetal distress / Preterm Birth / CHTN with superimposed PEC Information for the patient's newborn:  Cleon DewConstantino, Boy Elizabeth Yang [[id number]][[id number]]  female  / circ infant in NICU  Reports feeling well Feeding: bottle pumped breast milk Patient reports tolerating PO.  Breast symptoms: pumping every 2-3 hrs Pain controlled with ibuprofen (OTC) and narcotic analgesics including Percocet Denies HA/SOB/C/P/N/V/dizziness. Flatus present. No BM. She reports vaginal bleeding as normal, without clots.  She is ambulating, urinating without difficult.     Objective:   VS:  Filed Vitals:   09/30/14 1111 09/30/14 1125 09/30/14 1130 09/30/14 1151  BP: 167/105 (1 hr after Labetalol dose) 155/100 (immediately after Apresoline IVP) 152/92 144/93 (30 mins after Apresoline IVP dose)  Pulse: 81     Temp:      TempSrc:      Resp: 18     Height:      Weight: 178 lbs     SpO2:      Weight: down 4 lbs     Intake/Output Summary (Last 24 hours) at 09/30/14 1210 Last data filed at 09/29/14 2227  Gross per 24 hour  Intake      0 ml  Output   3000 ml  Net  -3000 ml        Recent Labs  09/28/14 0704 09/30/14 0532  WBC 13.5* 13.3*  HGB 11.3* 11.4*  HCT 33.5* 34.2*  PLT 154 171   Results for orders placed or performed during the hospital encounter of 09/24/14 (from the past 24 hour(s))  CBC with Differential/Platelet     Status: Abnormal   Collection Time: 09/30/14  5:32 AM  Result Value Ref Range   WBC 13.3 (H) 4.0 - 10.5 K/uL   RBC 3.60 (L) 3.87 - 5.11 MIL/uL   Hemoglobin 11.4 (L) 12.0 - 15.0 g/dL   HCT 78.234.2 (L) 95.636.0 - 21.346.0 %   MCV 95.0 78.0 - 100.0 fL   MCH 31.7 26.0 - 34.0 pg   MCHC 33.3 30.0 - 36.0 g/dL   RDW 08.613.7 57.811.5 - 46.915.5 %   Platelets 171 150 - 400 K/uL   Neutrophils Relative % 79 (H) 43 - 77 %   Neutro Abs 10.5 (H) 1.7 - 7.7 K/uL   Lymphocytes Relative 12 12 -  46 %   Lymphs Abs 1.6 0.7 - 4.0 K/uL   Monocytes Relative 8 3 - 12 %   Monocytes Absolute 1.1 (H) 0.1 - 1.0 K/uL   Eosinophils Relative 1 0 - 5 %   Eosinophils Absolute 0.1 0.0 - 0.7 K/uL   Basophils Relative 0 0 - 1 %   Basophils Absolute 0.0 0.0 - 0.1 K/uL  Comprehensive metabolic panel     Status: Abnormal   Collection Time: 09/30/14  5:32 AM  Result Value Ref Range   Sodium 137 135 - 145 mmol/L   Potassium 4.6 3.5 - 5.1 mmol/L   Chloride 104 96 - 112 mmol/L   CO2 27 19 - 32 mmol/L   Glucose, Bld 103 (H) 70 - 99 mg/dL   BUN 23 6 - 23 mg/dL   Creatinine, Ser 6.290.95 0.50 - 1.10 mg/dL   Calcium 9.5 8.4 - 52.810.5 mg/dL   Total Protein 6.1 6.0 - 8.3 g/dL   Albumin 3.0 (L) 3.5 - 5.2 g/dL   AST 22 0 - 37 U/L   ALT 43 (H) 0 -  35 U/L   Alkaline Phosphatase 61 39 - 117 U/L   Total Bilirubin 0.2 (L) 0.3 - 1.2 mg/dL   GFR calc non Af Amer 78 (L) >90 mL/min   GFR calc Af Amer >90 >90 mL/min   Anion gap 6 5 - 15     Blood type: A POS (04/25 0430)  Rubella:  Immune     Physical Exam:  General: alert, cooperative and no distress CV: Regular rate and rhythm, S1S2 present or without murmur or extra heart sounds Resp: clear Abdomen: soft, nontender, normal bowel sounds Incision: Tegaderm and Honeycomb C/D/I; skin well-approximated well with sutures, no ecchymosis, erythema, or edema Uterine Fundus: firm, 3 FB below umbilicus, nontender Lochia: minimal Ext: edema 1+ BLE (right a little more edematous than left) and Homans sign is negative, no sign of DVT   Assessment/Plan: 34 y.o.   POD# 3.  s/p Cesarean Delivery.  Indications: fetal distress and preterm / CHTN with superimposed PEC / reversal of doppler flow                Principal Problem:   Postpartum care following cesarean delivery (4/25) Active Problems:   Elevated blood pressure affecting pregnancy in second trimester, antepartum   Preeclampsia, severe   Fetal growth restriction   [redacted] weeks gestation of pregnancy    Postoperative state  Doing well, stable.               Regular diet as tolerated Maintain saline lock  Ambulate Routine post-op care Consider discharge home tomorrow - pending BP stabilization  *Consult with Dr. Seymour Bars on management - notified of continued elevation in BPs despite Labetalol 400 mg TID and Procardia 30 XL doses, notified of RN giving Apresoline IV dose - BP pending / orders to d/c Procardia 30 XL and change to Apresoline 10 mg TID / no discharge home today - will consider tomorrow dependent on BP stabilization for remainder of the day today  Raelyn Mora, M, MSN, CNM 09/30/2014, 12:10 PM

## 2014-09-30 NOTE — Lactation Note (Signed)
This note was copied from the chart of Elizabeth Yang. Lactation Consultation Note  Patient Name: Elizabeth Yang NFAOZ'HToday's Date: 09/30/2014 Reason for consult: Follow-up assessment;NICU baby NICU baby 82 hours, 3460w4d CGA. Called to discuss compatibility of new medication (Apresoline) prescribed for mom and breastfeeding her baby. Copied relevant pages from [facility]'s "Medications and Mothers' Milk" and reviewed with baby's RN Marisue IvanLiz, encouraging her to discuss with the baby's HCP, and gave copies to [facility] with review. Medication is an L2 "Probably Compatible" medication per [facility].   Mom has DEBP paperwork at bedside and will need to rent pump prior to D/C.  Maternal Data    Feeding    LATCH Score/Interventions                      Lactation Tools Discussed/Used     Consult Status Consult Status: Follow-up Date: 10/01/14 Follow-up type: In-patient    Geralynn OchsWILLIARD, Keighan Amezcua 09/30/2014, 4:13 PM

## 2014-10-01 ENCOUNTER — Ambulatory Visit (HOSPITAL_COMMUNITY): Payer: BC Managed Care – PPO

## 2014-10-01 LAB — CBC WITH DIFFERENTIAL/PLATELET
BASOS ABS: 0.1 10*3/uL (ref 0.0–0.1)
BASOS PCT: 1 % (ref 0–1)
EOS PCT: 0 % (ref 0–5)
Eosinophils Absolute: 0 10*3/uL (ref 0.0–0.7)
HCT: 34.1 % — ABNORMAL LOW (ref 36.0–46.0)
HEMOGLOBIN: 11.4 g/dL — AB (ref 12.0–15.0)
LYMPHS ABS: 0.3 10*3/uL — AB (ref 0.7–4.0)
LYMPHS PCT: 4 % — AB (ref 12–46)
MCH: 31.8 pg (ref 26.0–34.0)
MCHC: 33.4 g/dL (ref 30.0–36.0)
MCV: 95 fL (ref 78.0–100.0)
MONO ABS: 0.7 10*3/uL (ref 0.1–1.0)
Monocytes Relative: 8 % (ref 3–12)
NEUTROS PCT: 88 % — AB (ref 43–77)
Neutro Abs: 8.6 10*3/uL — ABNORMAL HIGH (ref 1.7–7.7)
Platelets: 124 10*3/uL — ABNORMAL LOW (ref 150–400)
RBC: 3.59 MIL/uL — AB (ref 3.87–5.11)
RDW: 13.8 % (ref 11.5–15.5)
WBC: 9.8 10*3/uL (ref 4.0–10.5)

## 2014-10-01 MED ORDER — LABETALOL HCL 200 MG PO TABS
600.0000 mg | ORAL_TABLET | Freq: Three times a day (TID) | ORAL | Status: DC
  Administered 2014-10-01 – 2014-10-02 (×4): 600 mg via ORAL
  Filled 2014-10-01 (×4): qty 3

## 2014-10-01 MED ORDER — HYDRALAZINE HCL 10 MG PO TABS
10.0000 mg | ORAL_TABLET | Freq: Four times a day (QID) | ORAL | Status: DC
  Administered 2014-10-01 – 2014-10-02 (×6): 10 mg via ORAL
  Filled 2014-10-01 (×9): qty 1

## 2014-10-01 NOTE — Lactation Note (Signed)
This note was copied from the chart of Boy Rubbie BattiestFrances Constantino. Lactation Consultation Note  Patient Name: Boy Rubbie BattiestFrances Constantino ZOXWR'UToday's Date: 10/01/2014 Reason for consult: Follow-up assessment   With this mom of a NICU baby, now 194 days old, and 27 5/7 weeks CGA. Mom was supposed to go home today, but due to fever and high bllod pressure, she is not. She c/o sore nipples, so I went to observe her pumping. Mom has multiple blebs on her nipples, and they are red and tender. Mom was putting suction too high. Her flanges are 24's and fit well. Mom is pumping up to 4 ounces now, and was still using the premie setting. I had her switch to standard setting, and instructed her to pump 15-30 minutes now, until she stops dripping. She was able to express an additional 20-30 ml's by adding some pumping time. I advised mom to apply EBM, and gave her comfort gels and instruction in how to use and care for them. Mom knows to call for questions/conerns.    Maternal Data    Feeding Feeding Type: Breast Milk  LATCH Score/Interventions          Comfort (Breast/Nipple): Filling, red/small blisters or bruises, mild/mod discomfort  Problem noted: Mild/Moderate discomfort;Cracked, bleeding, blisters, bruises Interventions  (Cracked/bleeding/bruising/blister): Expressed breast milk to nipple Interventions (Mild/moderate discomfort): Comfort gels        Lactation Tools Discussed/Used     Consult Status Consult Status: Follow-up Date: 10/02/14 Follow-up type: In-patient    Alfred LevinsLee, Teandra Harlan Anne 10/01/2014, 6:29 PM

## 2014-10-01 NOTE — Progress Notes (Addendum)
POD # 4  Subjective: Pt reports feeling ok-shaky during the night, muscles feel sore/ Pain controlled with Motrin and Percocet Tolerating po/Voiding without problems/ No n/v/ Flatus present, +BM Activity: ad lib Bleeding is scant Newborn info:  Information for the patient's newborn:  Cleon DewConstantino, Boy Cami [[id number]][[id number]]  female   NICU/Feeding: breastpumping   Objective: VS:  Filed Vitals:   10/01/14 0512 10/01/14 0547 10/01/14 1025 10/01/14 1029  BP: 153/101 150/92 162/101 155/96  Pulse: 109  112   Temp: 97.9 F (36.6 C)  102.7 F (39.3 C) 102.1 F (38.9 C)  TempSrc: Oral  Oral Oral  Resp: 21  22   Height:      Weight: 80.4 kg (177 lb 4 oz)     SpO2: 100%  98%     I&O: Intake/Output      04/28 0701 - 04/29 0700 04/29 0701 - 04/30 0700   Urine (mL/kg/hr)     Total Output       Net              LABS:  Recent Labs  09/30/14 0532  WBC 13.3*  HGB 11.4*  HCT 34.2*  PLT 171    Blood type: --/--/A POS (04/25 0430) Rubella:   Immune    Physical Exam:  General: alert and cooperative CV: tachy, normal rhythm Resp: CTA bilaterally Abdomen: soft, nontender, normal bowel sounds Uterine Fundus: firm, below umbilicus, nontender Incision: Covered with Tegaderm and honeycomb dressing; no significant drainage, edema, bruising, or erythema; well approximated with suture Lochia: none Ext: extremities normal, atraumatic, no cyanosis or edema and Homans sign is negative, no sign of DVT    Assessment/: POD # 4/ G1P0100/ S/P C/Section d/t NRFHT Chronic HTN w/superimposed PIH-labile BPs Hx of adrenal disorder Fever of unknown origin  Plan: CBCd, blood cultures x2, urine culture IV Hydralazine now, increase po Hydralazine to q6, increase Labetalol to 600 mg Continue routine post op orders Consult with Dr. Cherly Hensenousins about management plan, agrees with above    Signed: Donette Yang, Elizabeth Yang, N, MSN, CNM 10/01/2014, 11:09 AM  On call MD: note above CBC    Component Value  Date/Time   WBC 9.8 10/01/2014 1123   RBC 3.59* 10/01/2014 1123   HGB 11.4* 10/01/2014 1123   HCT 34.1* 10/01/2014 1123   PLT 124* 10/01/2014 1123   MCV 95.0 10/01/2014 1123   MCH 31.8 10/01/2014 1123   MCHC 33.4 10/01/2014 1123   RDW 13.8 10/01/2014 1123   LYMPHSABS 0.3* 10/01/2014 1123   MONOABS 0.7 10/01/2014 1123   EOSABS 0.0 10/01/2014 1123   BASOSABS 0.1 10/01/2014 1123     Exam done as well. Notable as above including bilateral nipple blebs. Breast nontender full no erythema or focal tenderness to suggest mastitis Postpartum fever  W/o  Focal cause on exam. Await u/a. If w/u neg, need ct scan r/o septic pelvic thrombophlebitis. At which time will need antibiotics and anticoag

## 2014-10-02 LAB — CBC
HEMATOCRIT: 34.7 % — AB (ref 36.0–46.0)
Hemoglobin: 11.8 g/dL — ABNORMAL LOW (ref 12.0–15.0)
MCH: 32.2 pg (ref 26.0–34.0)
MCHC: 34 g/dL (ref 30.0–36.0)
MCV: 94.6 fL (ref 78.0–100.0)
PLATELETS: 142 10*3/uL — AB (ref 150–400)
RBC: 3.67 MIL/uL — ABNORMAL LOW (ref 3.87–5.11)
RDW: 13.8 % (ref 11.5–15.5)
WBC: 5.8 10*3/uL (ref 4.0–10.5)

## 2014-10-02 LAB — COMPREHENSIVE METABOLIC PANEL
ALBUMIN: 3 g/dL — AB (ref 3.5–5.2)
ALK PHOS: 58 U/L (ref 39–117)
ALK PHOS: 59 U/L (ref 39–117)
ALT: 78 U/L — ABNORMAL HIGH (ref 0–35)
ALT: 80 U/L — ABNORMAL HIGH (ref 0–35)
ANION GAP: 6 (ref 5–15)
AST: 30 U/L (ref 0–37)
AST: 31 U/L (ref 0–37)
Albumin: 3.1 g/dL — ABNORMAL LOW (ref 3.5–5.2)
Anion gap: 7 (ref 5–15)
BILIRUBIN TOTAL: 0.2 mg/dL — AB (ref 0.3–1.2)
BUN: 22 mg/dL (ref 6–23)
BUN: 23 mg/dL (ref 6–23)
CALCIUM: 9.5 mg/dL (ref 8.4–10.5)
CHLORIDE: 107 mmol/L (ref 96–112)
CO2: 23 mmol/L (ref 19–32)
CO2: 26 mmol/L (ref 19–32)
CREATININE: 0.91 mg/dL (ref 0.50–1.10)
Calcium: 8.4 mg/dL (ref 8.4–10.5)
Chloride: 107 mmol/L (ref 96–112)
Creatinine, Ser: 0.89 mg/dL (ref 0.50–1.10)
GFR calc Af Amer: >90 mL/min (ref >90)
GFR calc non Af Amer: 82 mL/min — ABNORMAL LOW (ref >90)
GFR, EST NON AFRICAN AMERICAN: 84 mL/min — AB (ref >90)
Glucose, Bld: 122 mg/dL — ABNORMAL HIGH (ref 70–99)
Glucose, Bld: 113 mg/dL — ABNORMAL HIGH (ref 70–99)
POTASSIUM: 4 mmol/L (ref 3.5–5.1)
Potassium: 4.9 mmol/L (ref 3.5–5.1)
Sodium: 137 mmol/L (ref 135–145)
Sodium: 139 mmol/L (ref 135–145)
TOTAL PROTEIN: 6.8 g/dL (ref 6.0–8.3)
Total Bilirubin: 0.2 mg/dL — ABNORMAL LOW (ref 0.3–1.2)
Total Protein: 5.8 g/dL — ABNORMAL LOW (ref 6.0–8.3)

## 2014-10-02 LAB — URINE CULTURE

## 2014-10-02 MED ORDER — LABETALOL HCL 300 MG PO TABS: 600.0000 mg | ORAL_TABLET | Freq: Three times a day (TID) | ORAL | Status: DC

## 2014-10-02 MED ORDER — HYDRALAZINE HCL 10 MG PO TABS: 10.0000 mg | ORAL_TABLET | Freq: Four times a day (QID) | ORAL | Status: DC

## 2014-10-02 MED ORDER — IBUPROFEN 600 MG PO TABS: 600.0000 mg | ORAL_TABLET | Freq: Four times a day (QID) | ORAL | Status: DC

## 2014-10-02 MED ORDER — OXYCODONE-ACETAMINOPHEN 5-325 MG PO TABS: 1.0000 | ORAL_TABLET | ORAL | Status: DC | PRN

## 2014-10-02 NOTE — Progress Notes (Signed)
Chaplain paged to be with them as [facility]. Provision of comfort and spiritual care given. Ms [name] was [facility]. Lunette StandsChaplain is available to assist with any emotional and/or spiritual pain that may accompany this separation. [name]. [facility] Ms [name] when she is discharged.  Benjie Karvonenharles D. Macarthur Lorusso, DMin Chaplain

## 2014-10-02 NOTE — Discharge Summary (Signed)
Physician Discharge Summary  Patient ID: Elizabeth Yang MRN: [id number] DOB/AGE: February 16, 1981 34 y.o.  Admit date: 09/24/2014 Discharge date: 10/02/2014  Admission Diagnoses:  Worsening chronic HTN, suspected superimposed preeclampsia, 26.5/7 wks  Growth restricted fetus with reversed umbilical artery dopplers Unexplained elevated AFP New finding of fetal echogenic bowel  Discharge Diagnoses:  1) Postpartum care following cesarean delivery - Low transverse Cesarean section on 09/27/14 (after 48 hrs of Betamethasone for fetal lung maturity and Magnesium sulfate for fetal neuroprophylaxis) 2) Chronic HTN, moderately controlled with Labetalol/ Hydralazine since breast pumping for premature infant 3) Urgent discharge per request since infant being [facility] due to intestinal perforation  Hospital Course: Patient was admitted on 09/24/14 from office with elevated BPs in over 160/110 range. She had fetal sonogram that noted noted IUGR, echogenic bowel and reversal of umbilical artery Doppler flow.  She was given Magnesium sulfate and Betamethasone. Labetalol 600mg  every 8 hours and Hydralazine 10mg  every 6 hours to control BPs. She underwent urgent Cesarean section after 48 hrs of steroids (on 09/27/14) and was transferred to Adult ICU and infant admitted to NICU. Infant's chromosomal studies are pending.  Patient remained stable with BPs still in 140-150/ 90s-100 range but asymptomatic.  Labs -CBC, CMP were normal except anemia.  She was supposed to get discharged on Post-op day 4 (10/01/14) but developed fever of 102. Exam was normal, WBC normal, blood and urine culture sent. Her fever resolved and exam remained non focal so no further tests were needed.  On 4/30- ALT was elevated at 78, repeat after 12 hrs was 80 and due to request, she was discharged home since platelets were normal, f/up for labs in office on 10/04/14.     Discharged Condition: Improved BPs and symptoms.  Post-op pain well controlled.  Patient has severe preeclampsia with no evidence of HELLP syndrome, however on 4/29 Platelet count was 124K, repeated on 4/30 AM was 145 but ALT was elevated at 78 and repeated on 4/30 12 hours later was 80 (stable).  Plan is to repeat CBC, CMP in office on 5/2 and patient to return to MAU here or present to ED if any symptoms of worsening preeclampsia or HTN noted. Agreed.   Discharge Exam: Blood pressure 142/104, pulse 85, temperature 98.7 F (37.1 C), temperature source Oral, resp. rate 19, height 5\' 2"  (1.575 m), weight 172 lb 1.9 oz (78.073 kg), SpO2 99 %, unknown if currently breastfeeding. Post -op stable. Exam normal. Lungs clear, CV RRR, DTR +2, edema reduced.  Disposition: Home, but may consider staying [facility].   Discharge Instructions    Call MD for:  difficulty breathing, headache or visual disturbances    Complete by:  As directed      Call MD for:  extreme fatigue    Complete by:  As directed      Call MD for:  hives    Complete by:  As directed      Call MD for:  persistant dizziness or light-headedness    Complete by:  As directed      Call MD for:  persistant nausea and vomiting    Complete by:  As directed      Call MD for:  redness, tenderness, or signs of infection (pain, swelling, redness, odor or green/yellow discharge around incision site)    Complete by:  As directed      Call MD for:  severe uncontrolled  pain    Complete by:  As directed      Call MD for:  temperature >100.4    Complete by:  As directed      Call MD for:    Complete by:  As directed   Headache, vision changes/ blurry/blindness, epigastric pain, swelling getting worse, decreased urine production, shortness of breath, chest pain, severe breast pain/ redness/ lump     Diet - low sodium heart healthy    Complete by:  As directed      Discharge wound care:    Complete by:  As directed   Keep  incision clean and dry     Driving Restrictions    Complete by:  As directed   4 weeks     Increase activity slowly    Complete by:  As directed      Lifting restrictions    Complete by:  As directed   6 weeks     No dressing needed    Complete by:  As directed      Sexual Activity Restrictions    Complete by:  As directed   6 weeks            Medication List    TAKE these medications        albuterol 108 (90 BASE) MCG/ACT inhaler  Commonly known as:  PROVENTIL HFA;VENTOLIN HFA  Inhale 2 puffs into the lungs every 6 (six) hours as needed for wheezing or shortness of breath.     hydrALAZINE 10 MG tablet  Commonly known as:  APRESOLINE  Take 1 tablet (10 mg total) by mouth every 6 (six) hours.     ibuprofen 600 MG tablet  Commonly known as:  ADVIL,MOTRIN  Take 1 tablet (600 mg total) by mouth every 6 (six) hours.     labetalol 200 MG tablet  Commonly known as:  NORMODYNE  Take 400 mg by mouth 3 (three) times daily.     labetalol 300 MG tablet  Commonly known as:  NORMODYNE  Take 2 tablets (600 mg total) by mouth 3 (three) times daily.     oxyCODONE-acetaminophen 5-325 MG per tablet  Commonly known as:  PERCOCET/ROXICET  Take 1 tablet by mouth every 4 (four) hours as needed (for pain scale 4-7).     prenatal multivitamin Tabs tablet  Take 1 tablet by mouth daily at 12 noon.           Follow-up Information    Follow up with [facility] A., MD. Schedule an appointment as soon as possible for a visit in 2 days.   Specialty:  Obstetrics and Gynecology   Why:  for evaluation and BP check   Contact information:   [address] [place] [address] [phone]     Reviewed her pre-pregnancy meds (Diovan-HCTZ) which best controlled her BPs is pregnancy category D and not studied in breast feeding, so don't recommend it at present if we are able to keep BPs relative stable <140/90 range.  Patient will continue home BP monitoring and report back if  >150/100.  Office visit on 5/2 for BP check and CBC, CMP.   Signed: Treyvonne Tata R 10/02/2014, 6:38 PM

## 2014-10-02 NOTE — Progress Notes (Signed)
Discharge instructions provided to patient and significant other at bedside.  Activity, medications, follow up appointments, when to call the doctor and community resources discussed.  No questions at this time.  Patient left unit in stable condition with all personal belongings and prescriptions accompanied by staff.  Rental breast pump completed.  Osvaldo AngstK. Apolonio Cutting, RN------------------------

## 2014-10-02 NOTE — Progress Notes (Signed)
Subjective: Attempted to see pt twice before this morning but she was in NICU. Now up to see pt since she is requesting discharge to be with the baby [facility].  Patient denies any complaints except some dizziness if stands for some time. No HA/ vision changes/ epig pain/ SOB/ CP. LE swelling better.   Pt was kept in hospital for extended time due to poorly controlled BPs and further extended yesterday due to a fever of 102. Fever has not returned since 2 pm spike yesterday. She denies chills/ flank pain/ abdo -pelvic pain. Surgical pain well controlled with occ Percocet use.   Objective: Vital signs in last 24 hours: Temp:  [97.9 F (36.6 C)-98.7 F (37.1 C)] 98.7 F (37.1 C) (04/30 1728) Pulse Rate:  [72-92] 85 (04/30 1728) Resp:  [18-19] 19 (04/30 1728) BP: (125-158)/(84-106) 142/104 mmHg (04/30 1728) SpO2:  [98 %-100 %] 99 % (04/30 1728) Weight:  [172 lb 1.9 oz (78.073 kg)] 172 lb 1.9 oz (78.073 kg) (04/30 0550) Weight change: -5 lb 2.1 oz (-2.327 kg) Last BM Date: 09/27/14   Physical exam:  A&O x 3, no acute distress. Pleasant HEENT neg, no thyromegaly Lungs CTA bilat CV RRR, S1S2 normal Breasts soft, normal  Abdo soft, non tender, non acute, soft non tender uterus  Extr no edema/ tenderness Pelvic deferred  Lab Results:  Recent Labs  10/01/14 1123 10/02/14 0630  WBC 9.8 5.8  HGB 11.4* 11.8*  HCT 34.1* 34.7*  PLT 124* 142*   CMP Latest Ref Rng 10/02/2014 09/30/2014 09/28/2014  Glucose 70 - 99 mg/dL 562(Z122(H) 308(M103(H) 96  BUN 6 - 23 mg/dL 22 23 57(Q24(H)  Creatinine 0.50 - 1.10 mg/dL 4.690.89 6.290.95 5.280.94  Sodium 135 - 145 mmol/L 137 137 135  Potassium 3.5 - 5.1 mmol/L 4.0 4.6 4.9  Chloride 96 - 112 mmol/L 107 104 105  CO2 19 - 32 mmol/L 23 27 24   Calcium 8.4 - 10.5 mg/dL 8.4 9.5 7.3(L)  Total Protein 6.0 - 8.3 g/dL 4.1(L5.8(L) 6.1 2.4(M5.7(L)  Total Bilirubin 0.3 - 1.2 mg/dL 0.1(U0.2(L) 2.7(O0.2(L) 5.3(G0.1(L)  Alkaline Phos 39 - 117 U/L 58 61 57   AST 0 - 37 U/L 30 22 16   ALT 0 - 35 U/L 78(H) 43(H) 24    Medications:  Labetalol 600 mg q8 hrs, Hydralazine 10mg  q6hrs.  Assessment/Plan:  LOS: 8 days  Postpartum day #6.  Worsening CHTN, superimposed preeclampsia. - BP stable but per pt best medication for her is Diovan/HCTZ (prior Atenolol use failed to control BP, sees Dr Garlan Fillersisovac). Pregnancy category D and no data in breast feeding for Diovan, HCTZ secreted in breast milk, so neither is recommended unless patient can be advised to stop breast feeding. She is pumping at present, infant is premature, will not be getting any feeds for a while due to bowel perforation but will benefit from breast milk when ready. Will reassess patient in office on 5/2 for BP check and consider switching if not better.   Elevated ALT since 4/29. No prior HELLP syndrome. Plan to repeat CMP before considering discharge. Platelets better.   Puerperal fever, no fever x 30 hrs, feels well, CBC normal, urine and blood culture pending.   Rozetta Stumpp R 10/02/2014, 6:16 PM

## 2014-10-02 NOTE — Discharge Instructions (Signed)
Cesarean Delivery  Cesarean delivery is the birth of a baby through a cut (incision) in the abdomen and womb (uterus).  LET [facility] PROVIDER KNOW ABOUT:  All medicines you are taking, including vitamins, herbs, eye drops, creams, and over-the-counter medicines.  Previous problems you or members of your family have had with the use of anesthetics.  Any blood disorders you have.  Previous surgeries you have had.  Medical conditions you have.  Any allergies you have.  Complicationsinvolving the pregnancy. RISKS AND COMPLICATIONS  Generally, this is a safe procedure. However, as with any procedure, complications can occur. Possible complications include:  Bleeding.  Infection.  Blood clots.  Injury to surrounding organs.  Problems with anesthesia.  Injury to the baby. BEFORE THE PROCEDURE   You may be given an antacid medicine to drink. This will prevent acid contents in your stomach from going into your lungs if you vomit during the surgery.  You may be given an antibiotic medicine to prevent infection. PROCEDURE   Hair may be removed from your pubic area and your lower abdomen. This is to prevent infection in the incision site.  A tube (Foley catheter) will be placed in your bladder to drain your urine from your bladder into a bag. This keeps your bladder empty during surgery.  An IV tube will be placed in your vein.  You may be given medicine to numb the lower half of your body (regional anesthetic). If you were in labor, you may have already had an epidural in place which can be used in both labor and cesarean delivery. You may possibly be given medicine to make you sleep (general anesthetic) though this is not as common.  An incision will be made in your abdomen that extends to your uterus. There are 2 basic kinds of incisions:  The horizontal (transverse) incision. Horizontal incisions are from side to side and are used for most routine cesarean  deliveries.  The vertical incision. The vertical incision is from the top of the abdomen to the bottom and is less commonly used. It is often done for women who have a serious complication (extreme prematurity) or under emergency situations.  The horizontal and vertical incisions may both be used at the same time. However, this is very uncommon.  An incision is then made in your uterus to deliver the baby.  Your baby will then be delivered.  Both incisions are then closed with absorbable stitches. AFTER THE PROCEDURE   If you were awake during the surgery, you will see your baby right away. If you were asleep, you will see your baby as soon as you are awake.  You may breastfeed your baby after surgery.  You may be able to get up and walk the same day as the surgery. If you need to stay in bed for a period of time, you will receive help to turn, cough, and take deep breaths after surgery. This helps prevent lung problems such as pneumonia.  Do not get out of bed alone the first time after surgery. You will need help getting out of bed until you are able to do this by yourself.  You may be able to shower the day after your cesarean delivery. After the bandage (dressing) is taken off the incision site, a nurse will assist you to shower if you would like help.  You will have pneumatic compression hose placed on your lower legs. This is done to prevent blood clots. When you are up  and walking regularly, they will no longer be necessary.  Do not cross your legs when you sit.  Save any blood clots that you pass. If you pass a clot while on the toilet, do not flush it. Call for the nurse. Tell the nurse if you think you are bleeding too much or passing too many clots.  You will be given medicine as needed. Let your health care providers know if you are hurting. You may also be given an antibiotic to prevent an infection.  Your IV tube will be taken out when you are drinking a reasonable  amount of fluids. The Foley catheter is taken out when you are up and walking.  If your blood type is Rh negative and your baby's blood type is Rh positive, you will be given a shot of anti-D immune globulin. This shot prevents you from having Rh problems with a future pregnancy. You should get the shot even if you had your tubes tied (tubal ligation).  If you are allowed to take the baby for a walk, place the baby in the bassinet and push it. Do not carry your baby in your arms. Document Released: 05/21/2005 Document Revised: 03/11/2013 Document Reviewed: 12/10/2012 [facility] Patient Information 2015 [place]. This information is not intended to replace advice given to you by your health care provider. Make sure you discuss any questions you have with your health care provider.  Preeclampsia and Eclampsia Preeclampsia is a serious condition that develops only during pregnancy. It is also called toxemia of pregnancy. This condition causes high blood pressure along with other symptoms, such as swelling and headaches. These may develop as the condition gets worse. Preeclampsia may occur 20 weeks or later into your pregnancy.  Diagnosing and treating preeclampsia early is very important. If not treated early, it can cause serious problems for you and your baby. One problem it can lead to is eclampsia, which is a condition that causes muscle jerking or shaking (convulsions) in the mother. Delivering your baby is the best treatment for preeclampsia or eclampsia.  RISK FACTORS The cause of preeclampsia is not known. You may be more likely to develop preeclampsia if you have certain risk factors. These include:   Being pregnant for the first time.  Having preeclampsia in a past pregnancy.  Having a family history of preeclampsia.  Having high blood pressure.  Being pregnant with twins or triplets.  Being 5935 or older.  Being African American.  Having kidney disease or diabetes.  Having  medical conditions such as lupus or blood diseases.  Being very overweight (obese). SIGNS AND SYMPTOMS  The earliest signs of preeclampsia are:  High blood pressure.  Increased protein in your urine. Your health care provider will check for this at every prenatal visit. Other symptoms that can develop include:   Severe headaches.  Sudden weight gain.  Swelling of your hands, face, legs, and feet.  Feeling sick to your stomach (nauseous) and throwing up (vomiting).  Vision problems (blurred or double vision).  Numbness in your face, arms, legs, and feet.  Dizziness.  Slurred speech.  Sensitivity to bright lights.  Abdominal pain. DIAGNOSIS  There are no screening tests for preeclampsia. Your health care provider will ask you about symptoms and check for signs of preeclampsia during your prenatal visits. You may also have tests, including:  Urine testing.  Blood testing.  Checking your baby's heart rate.  Checking the health of your baby and your placenta using images created with sound  waves (ultrasound). TREATMENT  You can work out the best treatment approach together with your health care provider. It is very important to keep all prenatal appointments. If you have an increased risk of preeclampsia, you may need more frequent prenatal exams.  Your health care provider may prescribe bed rest.  You may have to eat as little salt as possible.  You may need to take medicine to lower your blood pressure if the condition does not respond to more conservative measures.  You may need to stay in the hospital if your condition is severe. There, treatment will focus on controlling your blood pressure and fluid retention. You may also need to take medicine to prevent seizures.  If the condition gets worse, your baby may need to be delivered early to protect you and the baby. You may have your labor started with medicine (be induced), or you may have a cesarean  delivery.  Preeclampsia usually goes away after the baby is born. HOME CARE INSTRUCTIONS   Only take over-the-counter or prescription medicines as directed by your health care provider.  Lie on your left side while resting. This keeps pressure off your baby.  Elevate your feet while resting.  Get regular exercise. Ask your health care provider what type of exercise is safe for you.  Avoid caffeine and alcohol.  Do not smoke.  Drink 6-8 glasses of water every day.  Eat a balanced diet that is low in salt. Do not add salt to your food.  Avoid stressful situations as much as possible.  Get plenty of rest and sleep.  Keep all prenatal appointments and tests as scheduled. SEEK MEDICAL CARE IF:  You are gaining more weight than expected.  You have any headaches, abdominal pain, or nausea.  You are bruising more than usual.  You feel dizzy or light-headed. SEEK IMMEDIATE MEDICAL CARE IF:   You develop sudden or severe swelling anywhere in your body. This usually happens in the legs.  You gain 5 lb (2.3 kg) or more in a week.  You have a severe headache, dizziness, problems with your vision, or confusion.  You have severe abdominal pain.  You have lasting nausea or vomiting.  You have a seizure.  You have trouble moving any part of your body.  You develop numbness in your body.  You have trouble speaking.  You have any abnormal bleeding.  You develop a stiff neck.  You pass out. MAKE SURE YOU:   Understand these instructions.  Will watch your condition.  Will get help right away if you are not doing well or get worse. Document Released: 05/18/2000 Document Revised: 05/26/2013 Document Reviewed: 03/13/2013 [facility] Patient Information 2015 [place], [place]. This information is not intended to replace advice given to you by your health care provider. Make sure you discuss any questions you have with your health care provider.

## 2014-10-03 ENCOUNTER — Encounter (HOSPITAL_COMMUNITY)
Admission: RE | Admit: 2014-10-03 | Discharge: 2014-10-03 | Disposition: A | Discharge status: Home / Self Care | Payer: BC Managed Care – PPO | Source: Ambulatory Visit | Attending: Obstetrics | Admitting: Obstetrics

## 2014-10-03 DIAGNOSIS — O923 Agalactia: Secondary | ICD-10-CM | POA: Diagnosis present

## 2014-10-07 LAB — CULTURE, BLOOD (ROUTINE X 2)
Culture: NO GROWTH
Culture: NO GROWTH

## 2016-02-22 LAB — OB RESULTS CONSOLE ANTIBODY SCREEN: Antibody Screen: NEGATIVE

## 2016-02-22 LAB — OB RESULTS CONSOLE HIV ANTIBODY (ROUTINE TESTING): HIV: NONREACTIVE

## 2016-02-22 LAB — OB RESULTS CONSOLE RUBELLA ANTIBODY, IGM: Rubella: IMMUNE

## 2016-02-22 LAB — OB RESULTS CONSOLE GC/CHLAMYDIA
Chlamydia: NEGATIVE
Gonorrhea: NEGATIVE

## 2016-02-22 LAB — OB RESULTS CONSOLE ABO/RH: RH Type: POSITIVE

## 2016-02-22 LAB — OB RESULTS CONSOLE HEPATITIS B SURFACE ANTIGEN: Hepatitis B Surface Ag: NEGATIVE

## 2016-02-22 LAB — OB RESULTS CONSOLE RPR: RPR: NONREACTIVE

## 2016-08-03 LAB — OB RESULTS CONSOLE GBS: STREP GROUP B AG: NEGATIVE

## 2016-09-10 ENCOUNTER — Telehealth (HOSPITAL_COMMUNITY): Payer: Self-pay | Admitting: *Deleted

## 2016-09-10 ENCOUNTER — Other Ambulatory Visit: Payer: Self-pay | Admitting: Obstetrics & Gynecology

## 2016-09-10 ENCOUNTER — Encounter (HOSPITAL_COMMUNITY): Payer: Self-pay

## 2016-09-10 NOTE — Telephone Encounter (Signed)
Preadmission screen Unable to leave a message.  Email sent requesting the patient call me as soon as possible.

## 2016-09-11 ENCOUNTER — Encounter (HOSPITAL_COMMUNITY): Payer: Self-pay

## 2016-09-12 ENCOUNTER — Encounter (HOSPITAL_COMMUNITY)
Admission: RE | Admit: 2016-09-12 | Discharge: 2016-09-12 | Disposition: A | Discharge status: Home / Self Care | Payer: BC Managed Care – PPO | Source: Ambulatory Visit | Attending: Obstetrics and Gynecology | Admitting: Obstetrics and Gynecology

## 2016-09-12 ENCOUNTER — Inpatient Hospital Stay (EMERGENCY_DEPARTMENT_HOSPITAL)
Admission: AD | Admit: 2016-09-12 | Discharge: 2016-09-13 | Disposition: A | Discharge status: Home / Self Care | Payer: BC Managed Care – PPO | Source: Ambulatory Visit | Attending: Obstetrics and Gynecology | Admitting: Obstetrics and Gynecology

## 2016-09-12 DIAGNOSIS — O34219 Maternal care for unspecified type scar from previous cesarean delivery: Secondary | ICD-10-CM

## 2016-09-12 DIAGNOSIS — O10913 Unspecified pre-existing hypertension complicating pregnancy, third trimester: Secondary | ICD-10-CM

## 2016-09-12 DIAGNOSIS — G43709 Chronic migraine without aura, not intractable, without status migrainosus: Secondary | ICD-10-CM | POA: Diagnosis not present

## 2016-09-12 DIAGNOSIS — O1002 Pre-existing essential hypertension complicating childbirth: Secondary | ICD-10-CM | POA: Diagnosis not present

## 2016-09-12 DIAGNOSIS — O34211 Maternal care for low transverse scar from previous cesarean delivery: Secondary | ICD-10-CM | POA: Diagnosis not present

## 2016-09-12 HISTORY — DX: Personal history of urinary calculi: Z87.442

## 2016-09-12 HISTORY — DX: Vitamin D deficiency, unspecified: E55.9

## 2016-09-12 LAB — CBC
HCT: 37.3 % (ref 36.0–46.0)
HCT: 36.9 % (ref 36.0–46.0)
HEMOGLOBIN: 12.8 g/dL (ref 12.0–15.0)
Hemoglobin: 12.8 g/dL (ref 12.0–15.0)
MCH: 31 pg (ref 26.0–34.0)
MCH: 31.1 pg (ref 26.0–34.0)
MCHC: 34.3 g/dL (ref 30.0–36.0)
MCHC: 34.7 g/dL (ref 30.0–36.0)
MCV: 90.3 fL (ref 78.0–100.0)
MCV: 89.6 fL (ref 78.0–100.0)
PLATELETS: 227 10*3/uL (ref 150–400)
PLATELETS: 213 10*3/uL (ref 150–400)
RBC: 4.13 MIL/uL (ref 3.87–5.11)
RBC: 4.12 MIL/uL (ref 3.87–5.11)
RDW: 13.7 % (ref 11.5–15.5)
RDW: 13.7 % (ref 11.5–15.5)
WBC: 13 10*3/uL — ABNORMAL HIGH (ref 4.0–10.5)
WBC: 13.7 10*3/uL — ABNORMAL HIGH (ref 4.0–10.5)

## 2016-09-12 LAB — COMPREHENSIVE METABOLIC PANEL
ALK PHOS: 116 U/L (ref 38–126)
ALT: 22 U/L (ref 14–54)
ALT: 23 U/L (ref 14–54)
ANION GAP: 7 (ref 5–15)
ANION GAP: 7 (ref 5–15)
AST: 12 U/L — ABNORMAL LOW (ref 15–41)
AST: 15 U/L (ref 15–41)
Albumin: 2.9 g/dL — ABNORMAL LOW (ref 3.5–5.0)
Albumin: 3.1 g/dL — ABNORMAL LOW (ref 3.5–5.0)
Alkaline Phosphatase: 114 U/L (ref 38–126)
BUN: 17 mg/dL (ref 6–20)
BUN: 15 mg/dL (ref 6–20)
CALCIUM: 9.1 mg/dL (ref 8.9–10.3)
CHLORIDE: 107 mmol/L (ref 101–111)
CO2: 19 mmol/L — ABNORMAL LOW (ref 22–32)
CO2: 21 mmol/L — AB (ref 22–32)
Calcium: 8.7 mg/dL — ABNORMAL LOW (ref 8.9–10.3)
Chloride: 107 mmol/L (ref 101–111)
Creatinine, Ser: 0.7 mg/dL (ref 0.44–1.00)
Creatinine, Ser: 0.64 mg/dL (ref 0.44–1.00)
GFR calc Af Amer: >60 mL/min (ref >60)
GFR calc Af Amer: >60 mL/min (ref >60)
GFR calc non Af Amer: >60 mL/min (ref >60)
Glucose, Bld: 155 mg/dL — ABNORMAL HIGH (ref 65–99)
Glucose, Bld: 85 mg/dL (ref 65–99)
Potassium: 3.9 mmol/L (ref 3.5–5.1)
Potassium: 4.3 mmol/L (ref 3.5–5.1)
SODIUM: 135 mmol/L (ref 135–145)
Sodium: 133 mmol/L — ABNORMAL LOW (ref 135–145)
Total Bilirubin: 0.2 mg/dL — ABNORMAL LOW (ref 0.3–1.2)
Total Bilirubin: 0.6 mg/dL (ref 0.3–1.2)
Total Protein: 6.6 g/dL (ref 6.5–8.1)
Total Protein: 6.9 g/dL (ref 6.5–8.1)

## 2016-09-12 LAB — PROTEIN / CREATININE RATIO, URINE: Creatinine, Urine: 28 mg/dL

## 2016-09-12 LAB — TYPE AND SCREEN
ABO/RH(D): A POS
Antibody Screen: NEGATIVE

## 2016-09-12 MED ORDER — BUTALBITAL-APAP-CAFFEINE 50-325-40 MG PO TABS
2.0000 | ORAL_TABLET | Freq: Once | ORAL | Status: AC
  Administered 2016-09-12: 2 via ORAL
  Filled 2016-09-12: qty 2

## 2016-09-12 NOTE — Pre-Procedure Instructions (Signed)
Discussed medication plan with Dr Juliene Pina.  Plan is as follows.  Take norvasc ,prenatal vitamins, and labetolol 300 mg tonight as usual.  In the morning take only labetolol 300 mg.  No other medicines to be taken.

## 2016-09-12 NOTE — MAU Provider Note (Signed)
Chief Complaint:  Hypertension   First Provider Initiated Contact with Patient 09/12/16 2201      HPI: Elizabeth Yang is a 36 y.o. G2P0100 at [redacted]w[redacted]d who presents to maternity admissions reporting elevated BP and migraine h/a.  She reports a dull h/a x 2 weeks that resolves with Fioricet, then onset of severe frontal constant h/a today. She took her BP at home and it was 150s/100s so she called the office and was told to come in for evaluation.  She has not taken Fioricet or any other medication for h/a today.  Nothing makes her h/a better or worse. There are no associated symptoms.  She has hx of migraines and reports this is similar but this is her first migraine in 3-4 months. Pt has repeat C/S scheduled tomorrow morning with Dr Juliene Pina with hx of 28 week delivery with neonatal demise. She reports good fetal movement, denies LOF, vaginal bleeding, vaginal itching/burning, urinary symptoms, dizziness, n/v, or fever/chills.    HPI  Past Medical History: Past Medical History:  Diagnosis Date  . Anxiety   . Asthma   . Elevated blood pressure affecting pregnancy in second trimester, antepartum 09/24/2014  . Headache   . History of kidney stones   . Hypertension   . Postpartum care following cesarean delivery (4/25) 09/27/2014  . Vaginal Pap smear, abnormal   . Vitamin D deficiency     Past obstetric history: OB History  Gravida Para Term Preterm AB Living  0  SAB TAB Ectopic Multiple Live Births        0 1    # Outcome Date GA Lbr Len/2nd Weight Sex Delivery Anes PTL Lv  2 Current           1 Preterm 09/27/14 [redacted]w[redacted]d  1 lb 6.6 oz (0.64 kg) M CS-LTranv Spinal  ND      Past Surgical History: Past Surgical History:  Procedure Laterality Date  . CESAREAN SECTION N/A 09/27/2014   Procedure: CESAREAN SECTION;  Surgeon: Genia Del, MD;  Location: [facility];  Service: Obstetrics;  Laterality: N/A;  . TONSILLECTOMY    . WISDOM TOOTH EXTRACTION      Family History: Family  History  Problem Relation Age of Onset  . Heart disease Mother   . Hypertension Mother   . Kidney disease Sister   . Heart disease Maternal Aunt   . Heart disease Maternal Uncle   . Cancer Maternal Grandmother   . Heart disease Maternal Grandfather   . Diabetes Paternal Grandmother     Social History: Social History  Substance Use Topics  . Smoking status: Never Smoker  . Smokeless tobacco: Never Used  . Alcohol use No    Allergies: No Known Allergies  Meds:  Prescriptions Prior to Admission  Medication Sig Dispense Refill Last Dose  . albuterol (PROVENTIL HFA;VENTOLIN HFA) 108 (90 BASE) MCG/ACT inhaler Inhale 2 puffs into the lungs every 6 (six) hours as needed for wheezing or shortness of breath.   Past Week at Unknown time  . amLODipine (NORVASC) 10 MG tablet Take 10 mg by mouth daily.   09/11/2016 at Unknown time  . labetalol (NORMODYNE) 300 MG tablet Take 2 tablets (600 mg total) by mouth 3 (three) times daily. (Patient taking differently: Take 300 mg by mouth 3 (three) times daily. ) 42 tablet 0 09/12/2016 at 1800  . Magnesium 200 MG TABS Take 200 mg by mouth daily.   09/11/2016 at Unknown time  .  Prenatal Vit-Fe Fumarate-FA (PRENATAL MULTIVITAMIN) TABS tablet Take 1 tablet by mouth at bedtime.    09/11/2016 at Unknown time    ROS:  Review of Systems  Constitutional: Negative for chills, fatigue and fever.  Eyes: Negative for visual disturbance.  Respiratory: Negative for shortness of breath.   Cardiovascular: Negative for chest pain.  Gastrointestinal: Negative for abdominal pain, nausea and vomiting.  Genitourinary: Negative for difficulty urinating, dysuria, flank pain, pelvic pain, vaginal bleeding, vaginal discharge and vaginal pain.  Neurological: Positive for headaches. Negative for dizziness.  Psychiatric/Behavioral: Negative.      I have reviewed patient's Past Medical Hx, Surgical Hx, Family Hx, Social Hx, medications and allergies.   Physical Exam  Patient  Vitals for the past 24 hrs:  BP Temp Pulse Resp SpO2 Height Weight  09/12/16 2201 125/85 - 81 - 98 % - -  09/12/16 2159 - - 80 - 99 % - -  09/12/16 2152 - - 84 - 99 % - -  09/12/16 2147 - - 83 - 99 % - -  09/12/16 2146 128/82 - 83 - - - -  09/12/16 2142 - - 94 - 98 % - -  09/12/16 2137 138/88 - 80 - 99 % - -  09/12/16 2127 - - - - 96 % - -  09/12/16 2122 - - - - 97 % - -  09/12/16 2116 128/89 - 84 - 97 % - -  09/12/16 2108 (!) 145/96 97.9 F (36.6 C) 79 19 97 %  (1.575 m) 209 lb (94.8 kg)   Constitutional: Well-developed, well-nourished female in no acute distress.  Cardiovascular: normal rate Respiratory: normal effort GI: Abd soft, non-tender, gravid appropriate for gestational age.  MS: Extremities nontender, no edema, normal ROM Neurologic: Alert and oriented x 4.  GU: Neg CVAT.  PELVIC EXAM: Cervix pink, visually closed, without lesion, scant white creamy discharge, vaginal walls and external genitalia normal Bimanual exam: Cervix 0/long/high, firm, anterior, neg CMT, uterus nontender, nonenlarged, adnexa without tenderness, enlargement, or mass     FHT:  Baseline 150 , moderate variability, accelerations present, no decelerations Contractions: None on toco or to palpation   Labs:  --/--/A POS (04/11 1125)  Imaging:  No results found.  MAU Course/MDM: I have ordered labs and reviewed results.  CBC, CMP, U/A, P/C ratio pending NST reviewed and reactive Fioricet x 2 tabs given for h/a  Report to [place], CNM   Results for orders placed or performed during the hospital encounter of 09/12/16 (from the past 24 hour(s))  Protein / creatinine ratio, urine     Status: None   Collection Time: 09/12/16 10:15 PM  Result Value Ref Range   Creatinine, Urine 28.00 mg/dL   Total Protein, Urine <6 mg/dL   Protein Creatinine Ratio Too low to calculate       0.00 - 0.15 mg/mg[Cre]  CBC     Status: Abnormal   Collection Time: 09/12/16 10:32 PM  Result Value Ref  Range   WBC 13.7 (H) 4.0 - 10.5 K/uL   RBC 4.12 3.87 - 5.11 MIL/uL   Hemoglobin 12.8 12.0 - 15.0 g/dL   HCT 16.1 09.6 - 04.5 %   MCV 89.6 78.0 - 100.0 fL   MCH 31.1 26.0 - 34.0 pg   MCHC 34.7 30.0 - 36.0 g/dL   RDW 40.9 81.1 - 91.4 %   Platelets 213 150 - 400 K/uL  Comprehensive metabolic panel     Status: Abnormal   Collection Time:  09/12/16 10:32 PM  Result Value Ref Range   Sodium 135 135 - 145 mmol/L   Potassium 4.3 3.5 - 5.1 mmol/L   Chloride 107 101 - 111 mmol/L   CO2 21 (L) 22 - 32 mmol/L   Glucose, Bld 85 65 - 99 mg/dL   BUN 15 6 - 20 mg/dL   Creatinine, Ser 1.61 0.44 - 1.00 mg/dL   Calcium 8.7 (L) 8.9 - 10.3 mg/dL   Total Protein 6.9 6.5 - 8.1 g/dL   Albumin 3.1 (L) 3.5 - 5.0 g/dL   AST 15 15 - 41 U/L   ALT 23 14 - 54 U/L   Alkaline Phosphatase 114 38 - 126 U/L   Total Bilirubin 0.6 0.3 - 1.2 mg/dL   GFR calc non Af Amer >60 >60 mL/min   GFR calc Af Amer >60 >60 mL/min   Anion gap 7 5 - 15    Discussed Hx, labs, exam w/ Dr. Billy Coast. Agrees w/ POC. New orders: D/C home.   Feeling much better. Rates HA 5/10. States this is the relief that she normally experiences w/ Fioricet.   Assessment 1. Maternal chronic hypertension in third trimester   2. Chronic migraine without aura without status migrainosus, not intractable   3. Previous cesarean delivery affecting pregnancy    Plan: Discharge home per consult with Dr. Billy Coast. Preeclampsia precautions. Return tomorrow as scheduled for repeat C-section or sooner as needed if symptoms worsen or for labor, ruptured membranes, vaginal bleeding or decreased fetal movement. Allergies as of 09/13/2016   No Known Allergies     Medication List    TAKE these medications   albuterol 108 (90 Base) MCG/ACT inhaler Commonly known as:  PROVENTIL HFA;VENTOLIN HFA Inhale 2 puffs into the lungs every 6 (six) hours as needed for wheezing or shortness of breath.   amLODipine 10 MG tablet Commonly known as:  NORVASC Take 10 mg by  mouth daily.   labetalol 300 MG tablet Commonly known as:  NORMODYNE Take 2 tablets (600 mg total) by mouth 3 (three) times daily. What changed:  how much to take   Magnesium 200 MG Tabs Take 200 mg by mouth daily.   prenatal multivitamin Tabs tablet Take 1 tablet by mouth at bedtime.      Sharen Counter Certified Nurse-Midwife 09/12/2016 10:08 PM   Dorathy Kinsman, CNM 09/13/2016 5:06 AM

## 2016-09-12 NOTE — MAU Note (Signed)
Clean catch collected and sent to lab.

## 2016-09-12 NOTE — Pre-Procedure Instructions (Signed)
Called pt to confirm preop information after visit.  Reviewed all arrival informatio and medication plan.  Pt c/o nausea and sharp abdominal pains since she was here.  I told her to contact her physicians office for assessment.  verbalized understanding and stated she would call the office.

## 2016-09-12 NOTE — Patient Instructions (Addendum)
20 Elizabeth Yang  09/12/2016   Your procedure is scheduled on:  09/13/2016  Enter through the Main Entrance of [facility] at 1245 AM.  Pick up the phone at the desk and dial [phone].   Call this number if you have problems the morning of surgery: [phone]   Remember:   Do not eat food:After Midnight.  Do not drink clear liquids: 6 Hours before arrival.  Take these medicines the morning of surgery with A SIP OF WATER: take norvasc , labetolol  and prenatal vitamin tonight.  Take labetolol  in the morning on 4/12.     Do not wear jewelry, make-up or nail polish.  Do not wear lotions, powders, or perfumes. Do not wear deodorant.  Do not shave 48 hours prior to surgery.  Do not bring valuables to the hospital.  [facility] is not   responsible for any belongings or valuables brought to the hospital.  Contacts, dentures or bridgework may not be worn into surgery.  Leave suitcase in the car. After surgery it may be brought to your room.  For patients admitted to the hospital, checkout time is 11:00 AM the day of              discharge.   Patients discharged the day of surgery will not be allowed to drive             home.  Name and phone number of your driver: na  Special Instructions:   N/A   Please read over the following fact sheets that you were given:   Surgical Site Infection Prevention

## 2016-09-12 NOTE — MAU Note (Signed)
Call the nurse's hotline with BP of 150's over 114.  Advised to be seen at [facility].  On arrival, BP 145/96.  Positive for fetal movement, denies vaginal bleeding, denies sudden gush of fluid. Last sexual encounter was a week ago.  Pt. scheduled for repeat c-section on tomorrow at 14:30. History of a pre-term delivery, died at six days in NICU.

## 2016-09-13 ENCOUNTER — Encounter (HOSPITAL_COMMUNITY)
Admission: AD | Disposition: A | Discharge status: Home / Self Care | Payer: Self-pay | Source: Ambulatory Visit | Attending: Obstetrics & Gynecology

## 2016-09-13 ENCOUNTER — Inpatient Hospital Stay (HOSPITAL_COMMUNITY): Payer: BC Managed Care – PPO | Admitting: Certified Registered Nurse Anesthetist

## 2016-09-13 ENCOUNTER — Inpatient Hospital Stay (HOSPITAL_COMMUNITY)
Admission: AD | Admit: 2016-09-13 | Discharge: 2016-09-15 | DRG: 765 | Disposition: A | Discharge status: Home / Self Care | Payer: BC Managed Care – PPO | Source: Ambulatory Visit | Attending: Obstetrics & Gynecology | Admitting: Obstetrics & Gynecology

## 2016-09-13 ENCOUNTER — Encounter (HOSPITAL_COMMUNITY): Payer: Self-pay | Admitting: Obstetrics & Gynecology

## 2016-09-13 DIAGNOSIS — O365930 Maternal care for other known or suspected poor fetal growth, third trimester, not applicable or unspecified: Secondary | ICD-10-CM | POA: Diagnosis present

## 2016-09-13 DIAGNOSIS — O99214 Obesity complicating childbirth: Secondary | ICD-10-CM | POA: Diagnosis present

## 2016-09-13 DIAGNOSIS — Z3A38 38 weeks gestation of pregnancy: Secondary | ICD-10-CM | POA: Diagnosis not present

## 2016-09-13 DIAGNOSIS — O169 Unspecified maternal hypertension, unspecified trimester: Secondary | ICD-10-CM

## 2016-09-13 DIAGNOSIS — Z6838 Body mass index (BMI) 38.0-38.9, adult: Secondary | ICD-10-CM | POA: Diagnosis not present

## 2016-09-13 DIAGNOSIS — O9962 Diseases of the digestive system complicating childbirth: Secondary | ICD-10-CM | POA: Diagnosis present

## 2016-09-13 DIAGNOSIS — O34219 Maternal care for unspecified type scar from previous cesarean delivery: Secondary | ICD-10-CM | POA: Diagnosis not present

## 2016-09-13 DIAGNOSIS — O1002 Pre-existing essential hypertension complicating childbirth: Principal | ICD-10-CM | POA: Diagnosis present

## 2016-09-13 DIAGNOSIS — O34211 Maternal care for low transverse scar from previous cesarean delivery: Secondary | ICD-10-CM | POA: Diagnosis present

## 2016-09-13 DIAGNOSIS — Z833 Family history of diabetes mellitus: Secondary | ICD-10-CM | POA: Diagnosis not present

## 2016-09-13 DIAGNOSIS — Z8249 Family history of ischemic heart disease and other diseases of the circulatory system: Secondary | ICD-10-CM | POA: Diagnosis not present

## 2016-09-13 DIAGNOSIS — K219 Gastro-esophageal reflux disease without esophagitis: Secondary | ICD-10-CM | POA: Diagnosis present

## 2016-09-13 HISTORY — DX: Unspecified maternal hypertension, unspecified trimester: O16.9

## 2016-09-13 LAB — CBC
HEMATOCRIT: 36.8 % (ref 36.0–46.0)
Hemoglobin: 12.6 g/dL (ref 12.0–15.0)
MCH: 31 pg (ref 26.0–34.0)
MCHC: 34.2 g/dL (ref 30.0–36.0)
MCV: 90.4 fL (ref 78.0–100.0)
Platelets: 208 10*3/uL (ref 150–400)
RBC: 4.07 MIL/uL (ref 3.87–5.11)
RDW: 13.8 % (ref 11.5–15.5)
WBC: 12.5 10*3/uL — AB (ref 4.0–10.5)

## 2016-09-13 LAB — RPR: RPR: NONREACTIVE

## 2016-09-13 SURGERY — Surgical Case
Anesthesia: Spinal

## 2016-09-13 MED ORDER — SODIUM CHLORIDE 0.9% FLUSH: 3.0000 mL | INTRAVENOUS | Status: DC | PRN

## 2016-09-13 MED ORDER — AMLODIPINE BESYLATE 10 MG PO TABS
10.0000 mg | ORAL_TABLET | Freq: Every day | ORAL | Status: DC
  Administered 2016-09-13 – 2016-09-14 (×2): 10 mg via ORAL
  Filled 2016-09-13 (×4): qty 1

## 2016-09-13 MED ORDER — SENNOSIDES-DOCUSATE SODIUM 8.6-50 MG PO TABS
2.0000 | ORAL_TABLET | ORAL | Status: DC
  Administered 2016-09-13 – 2016-09-15 (×2): 2 via ORAL
  Filled 2016-09-13 (×2): qty 2

## 2016-09-13 MED ORDER — ONDANSETRON HCL 4 MG/2ML IJ SOLN
INTRAMUSCULAR | Status: AC
  Filled 2016-09-13: qty 2

## 2016-09-13 MED ORDER — PHENYLEPHRINE 40 MCG/ML (10ML) SYRINGE FOR IV PUSH (FOR BLOOD PRESSURE SUPPORT)
PREFILLED_SYRINGE | INTRAVENOUS | Status: AC
  Filled 2016-09-13: qty 10

## 2016-09-13 MED ORDER — SIMETHICONE 80 MG PO CHEW
80.0000 mg | CHEWABLE_TABLET | ORAL | Status: DC
  Administered 2016-09-13 – 2016-09-15 (×2): 80 mg via ORAL
  Filled 2016-09-13 (×2): qty 1

## 2016-09-13 MED ORDER — NALBUPHINE HCL 10 MG/ML IJ SOLN: 5.0000 mg | INTRAMUSCULAR | Status: DC | PRN

## 2016-09-13 MED ORDER — WITCH HAZEL-GLYCERIN EX PADS: 1.0000 "application " | MEDICATED_PAD | CUTANEOUS | Status: DC | PRN

## 2016-09-13 MED ORDER — LACTATED RINGERS IV SOLN
INTRAVENOUS | Status: DC | PRN
  Administered 2016-09-13: 16:00:00 via INTRAVENOUS

## 2016-09-13 MED ORDER — LABETALOL HCL 100 MG PO TABS
300.0000 mg | ORAL_TABLET | Freq: Three times a day (TID) | ORAL | Status: DC
  Administered 2016-09-13 – 2016-09-15 (×6): 300 mg via ORAL
  Filled 2016-09-13 (×6): qty 1

## 2016-09-13 MED ORDER — DEXAMETHASONE SODIUM PHOSPHATE 4 MG/ML IJ SOLN
INTRAMUSCULAR | Status: AC
  Filled 2016-09-13: qty 1

## 2016-09-13 MED ORDER — PRENATAL MULTIVITAMIN CH
1.0000 | ORAL_TABLET | Freq: Every day | ORAL | Status: DC
  Administered 2016-09-14 – 2016-09-15 (×2): 1 via ORAL
  Filled 2016-09-13 (×2): qty 1

## 2016-09-13 MED ORDER — MENTHOL 3 MG MT LOZG
1.0000 | LOZENGE | OROMUCOSAL | Status: DC | PRN
  Administered 2016-09-15: 3 mg via ORAL
  Filled 2016-09-13 (×2): qty 9

## 2016-09-13 MED ORDER — PHENYLEPHRINE 8 MG IN D5W 100 ML (0.08MG/ML) PREMIX OPTIME
INJECTION | INTRAVENOUS | Status: DC | PRN
  Administered 2016-09-13: 60 ug/min via INTRAVENOUS

## 2016-09-13 MED ORDER — CEFAZOLIN SODIUM-DEXTROSE 2-4 GM/100ML-% IV SOLN
2.0000 g | Freq: Once | INTRAVENOUS | Status: AC
  Administered 2016-09-13: 2 g via INTRAVENOUS
  Filled 2016-09-13: qty 100

## 2016-09-13 MED ORDER — ACETAMINOPHEN 325 MG PO TABS
650.0000 mg | ORAL_TABLET | ORAL | Status: DC | PRN
  Administered 2016-09-15 (×2): 650 mg via ORAL
  Filled 2016-09-13 (×2): qty 2

## 2016-09-13 MED ORDER — MEPERIDINE HCL 25 MG/ML IJ SOLN: 6.2500 mg | INTRAMUSCULAR | Status: DC | PRN

## 2016-09-13 MED ORDER — KETOROLAC TROMETHAMINE 30 MG/ML IJ SOLN: 30.0000 mg | Freq: Four times a day (QID) | INTRAMUSCULAR | Status: AC | PRN

## 2016-09-13 MED ORDER — BUPIVACAINE IN DEXTROSE 0.75-8.25 % IT SOLN
INTRATHECAL | Status: AC
  Filled 2016-09-13: qty 2

## 2016-09-13 MED ORDER — NALOXONE HCL 0.4 MG/ML IJ SOLN: 0.4000 mg | INTRAMUSCULAR | Status: DC | PRN

## 2016-09-13 MED ORDER — MIDAZOLAM HCL 2 MG/2ML IJ SOLN: 0.5000 mg | Freq: Once | INTRAMUSCULAR | Status: DC | PRN

## 2016-09-13 MED ORDER — PHENYLEPHRINE 8 MG IN D5W 100 ML (0.08MG/ML) PREMIX OPTIME
INJECTION | INTRAVENOUS | Status: AC
  Filled 2016-09-13: qty 100

## 2016-09-13 MED ORDER — OXYTOCIN 10 UNIT/ML IJ SOLN
INTRAMUSCULAR | Status: AC
  Filled 2016-09-13: qty 4

## 2016-09-13 MED ORDER — DIBUCAINE 1 % RE OINT: 1.0000 "application " | TOPICAL_OINTMENT | RECTAL | Status: DC | PRN

## 2016-09-13 MED ORDER — KETOROLAC TROMETHAMINE 30 MG/ML IJ SOLN
30.0000 mg | Freq: Four times a day (QID) | INTRAMUSCULAR | Status: AC | PRN
  Administered 2016-09-13: 30 mg via INTRAMUSCULAR

## 2016-09-13 MED ORDER — FENTANYL CITRATE (PF) 100 MCG/2ML IJ SOLN
INTRAMUSCULAR | Status: AC
  Filled 2016-09-13: qty 2

## 2016-09-13 MED ORDER — BUPIVACAINE IN DEXTROSE 0.75-8.25 % IT SOLN
INTRATHECAL | Status: DC | PRN
  Administered 2016-09-13: 1.2 mL via INTRATHECAL

## 2016-09-13 MED ORDER — NALBUPHINE HCL 10 MG/ML IJ SOLN: 5.0000 mg | Freq: Once | INTRAMUSCULAR | Status: DC | PRN

## 2016-09-13 MED ORDER — TETANUS-DIPHTH-ACELL PERTUSSIS 5-2.5-18.5 LF-MCG/0.5 IM SUSP: 0.5000 mL | Freq: Once | INTRAMUSCULAR | Status: DC

## 2016-09-13 MED ORDER — SCOPOLAMINE 1 MG/3DAYS TD PT72: 1.0000 | MEDICATED_PATCH | Freq: Once | TRANSDERMAL | Status: DC

## 2016-09-13 MED ORDER — SIMETHICONE 80 MG PO CHEW: 80.0000 mg | CHEWABLE_TABLET | ORAL | Status: DC | PRN

## 2016-09-13 MED ORDER — LIDOCAINE-PRILOCAINE 2.5-2.5 % EX CREA
1.0000 "application " | TOPICAL_CREAM | Freq: Once | CUTANEOUS | Status: DC
  Filled 2016-09-13: qty 5

## 2016-09-13 MED ORDER — IBUPROFEN 600 MG PO TABS
600.0000 mg | ORAL_TABLET | Freq: Four times a day (QID) | ORAL | Status: DC
  Administered 2016-09-13 – 2016-09-15 (×8): 600 mg via ORAL
  Filled 2016-09-13 (×8): qty 1

## 2016-09-13 MED ORDER — NALOXONE HCL 2 MG/2ML IJ SOSY: 1.0000 ug/kg/h | PREFILLED_SYRINGE | INTRAMUSCULAR | Status: DC | PRN

## 2016-09-13 MED ORDER — SODIUM CHLORIDE 0.9 % IR SOLN
Status: DC | PRN
  Administered 2016-09-13: 1000 mL

## 2016-09-13 MED ORDER — LACTATED RINGERS IV SOLN: INTRAVENOUS | Status: DC

## 2016-09-13 MED ORDER — OXYCODONE HCL 5 MG PO TABS
5.0000 mg | ORAL_TABLET | ORAL | Status: DC | PRN
Start: 2016-09-13 — End: 2016-09-15
  Administered 2016-09-15: 5 mg via ORAL
  Filled 2016-09-13 (×2): qty 1

## 2016-09-13 MED ORDER — SOD CITRATE-CITRIC ACID 500-334 MG/5ML PO SOLN
30.0000 mL | Freq: Once | ORAL | Status: AC
  Administered 2016-09-13: 30 mL via ORAL
  Filled 2016-09-13: qty 15

## 2016-09-13 MED ORDER — FENTANYL CITRATE (PF) 100 MCG/2ML IJ SOLN
INTRAMUSCULAR | Status: DC | PRN
  Administered 2016-09-13: 10 ug via INTRATHECAL

## 2016-09-13 MED ORDER — PROMETHAZINE HCL 25 MG/ML IJ SOLN: 6.2500 mg | INTRAMUSCULAR | Status: DC | PRN

## 2016-09-13 MED ORDER — ALBUTEROL SULFATE (2.5 MG/3ML) 0.083% IN NEBU
3.0000 mL | INHALATION_SOLUTION | Freq: Four times a day (QID) | RESPIRATORY_TRACT | Status: DC | PRN
  Filled 2016-09-13: qty 20

## 2016-09-13 MED ORDER — SCOPOLAMINE 1 MG/3DAYS TD PT72
1.0000 | MEDICATED_PATCH | Freq: Once | TRANSDERMAL | Status: DC
  Administered 2016-09-13: 1.5 mg via TRANSDERMAL
  Filled 2016-09-13: qty 1

## 2016-09-13 MED ORDER — SIMETHICONE 80 MG PO CHEW
80.0000 mg | CHEWABLE_TABLET | Freq: Three times a day (TID) | ORAL | Status: DC
  Administered 2016-09-14 – 2016-09-15 (×6): 80 mg via ORAL
  Filled 2016-09-13 (×6): qty 1

## 2016-09-13 MED ORDER — ZOLPIDEM TARTRATE 5 MG PO TABS: 5.0000 mg | ORAL_TABLET | Freq: Every evening | ORAL | Status: DC | PRN

## 2016-09-13 MED ORDER — ONDANSETRON HCL 4 MG/2ML IJ SOLN: 4.0000 mg | Freq: Three times a day (TID) | INTRAMUSCULAR | Status: DC | PRN

## 2016-09-13 MED ORDER — KETOROLAC TROMETHAMINE 30 MG/ML IJ SOLN
INTRAMUSCULAR | Status: AC
  Filled 2016-09-13: qty 1

## 2016-09-13 MED ORDER — MORPHINE SULFATE (PF) 0.5 MG/ML IJ SOLN
INTRAMUSCULAR | Status: DC | PRN
  Administered 2016-09-13: .2 mg via INTRATHECAL

## 2016-09-13 MED ORDER — OXYTOCIN 40 UNITS IN LACTATED RINGERS INFUSION - SIMPLE MED: 2.5000 [IU]/h | INTRAVENOUS | Status: AC

## 2016-09-13 MED ORDER — OXYTOCIN 40 UNITS IN LACTATED RINGERS INFUSION - SIMPLE MED
INTRAVENOUS | Status: DC | PRN
  Administered 2016-09-13: 40 mL via INTRAVENOUS

## 2016-09-13 MED ORDER — LACTATED RINGERS IV SOLN
INTRAVENOUS | Status: DC
  Administered 2016-09-13: 14:00:00 via INTRAVENOUS

## 2016-09-13 MED ORDER — MORPHINE SULFATE (PF) 0.5 MG/ML IJ SOLN
INTRAMUSCULAR | Status: AC
  Filled 2016-09-13: qty 10

## 2016-09-13 MED ORDER — OXYCODONE HCL 5 MG PO TABS
10.0000 mg | ORAL_TABLET | ORAL | Status: DC | PRN
Start: 2016-09-13 — End: 2016-09-15

## 2016-09-13 MED ORDER — COCONUT OIL OIL: 1.0000 "application " | TOPICAL_OIL | Status: DC | PRN

## 2016-09-13 MED ORDER — DIPHENHYDRAMINE HCL 25 MG PO CAPS: 25.0000 mg | ORAL_CAPSULE | ORAL | Status: DC | PRN

## 2016-09-13 MED ORDER — MORPHINE SULFATE (PF) 4 MG/ML IV SOLN: 1.0000 mg | INTRAVENOUS | Status: DC | PRN

## 2016-09-13 MED ORDER — DIPHENHYDRAMINE HCL 50 MG/ML IJ SOLN: 12.5000 mg | INTRAMUSCULAR | Status: DC | PRN

## 2016-09-13 MED ORDER — ONDANSETRON HCL 4 MG/2ML IJ SOLN
INTRAMUSCULAR | Status: DC | PRN
  Administered 2016-09-13: 4 mg via INTRAVENOUS

## 2016-09-13 MED ORDER — DIPHENHYDRAMINE HCL 25 MG PO CAPS: 25.0000 mg | ORAL_CAPSULE | Freq: Four times a day (QID) | ORAL | Status: DC | PRN

## 2016-09-13 MED ORDER — OXYMETAZOLINE HCL 0.05 % NA SOLN
2.0000 | Freq: Once | NASAL | Status: DC
  Filled 2016-09-13: qty 15

## 2016-09-13 SURGICAL SUPPLY — 34 items
BENZOIN TINCTURE PRP APPL 2/3 (GAUZE/BANDAGES/DRESSINGS) ×2 IMPLANT
CHLORAPREP W/TINT 26ML (MISCELLANEOUS) ×2 IMPLANT
CLAMP CORD UMBIL (MISCELLANEOUS) IMPLANT
CLOTH BEACON ORANGE TIMEOUT ST (SAFETY) ×2 IMPLANT
CONTAINER PREFILL 10% NBF 15ML (MISCELLANEOUS) IMPLANT
DRSG OPSITE POSTOP 4X10 (GAUZE/BANDAGES/DRESSINGS) ×2 IMPLANT
ELECT REM PT RETURN 9FT ADLT (ELECTROSURGICAL) ×2
ELECTRODE REM PT RTRN 9FT ADLT (ELECTROSURGICAL) ×1 IMPLANT
EXTRACTOR VACUUM KIWI (MISCELLANEOUS) IMPLANT
EXTRACTOR VACUUM M CUP 4 TUBE (SUCTIONS) IMPLANT
GLOVE BIO SURGEON STRL SZ7 (GLOVE) ×2 IMPLANT
GLOVE BIOGEL PI IND STRL 7.0 (GLOVE) ×2 IMPLANT
GLOVE BIOGEL PI INDICATOR 7.0 (GLOVE) ×2
GOWN STRL REUS W/TWL LRG LVL3 (GOWN DISPOSABLE) ×4 IMPLANT
KIT ABG SYR 3ML LUER SLIP (SYRINGE) IMPLANT
NEEDLE HYPO 25X5/8 SAFETYGLIDE (NEEDLE) IMPLANT
NS IRRIG 1000ML POUR BTL (IV SOLUTION) ×2 IMPLANT
PACK C SECTION WH (CUSTOM PROCEDURE TRAY) ×2 IMPLANT
PAD OB MATERNITY 4.3X12.25 (PERSONAL CARE ITEMS) ×2 IMPLANT
RTRCTR C-SECT PINK 25CM LRG (MISCELLANEOUS) IMPLANT
STRIP CLOSURE SKIN 1/2X4 (GAUZE/BANDAGES/DRESSINGS) ×2 IMPLANT
SUT MON AB-0 CT1 36 (SUTURE) ×4 IMPLANT
SUT PLAIN 0 NONE (SUTURE) IMPLANT
SUT PLAIN 2 0 (SUTURE) ×1
SUT PLAIN ABS 2-0 CT1 27XMFL (SUTURE) ×1 IMPLANT
SUT VIC AB 0 CT1 27 (SUTURE) ×2
SUT VIC AB 0 CT1 27XBRD ANBCTR (SUTURE) ×2 IMPLANT
SUT VIC AB 2-0 CT1 (SUTURE) ×2 IMPLANT
SUT VIC AB 2-0 CT1 27 (SUTURE) ×1
SUT VIC AB 2-0 CT1 TAPERPNT 27 (SUTURE) ×1 IMPLANT
SUT VIC AB 4-0 KS 27 (SUTURE) ×2 IMPLANT
SUT VICRYL 0 TIES 12 18 (SUTURE) IMPLANT
TOWEL OR 17X24 6PK STRL BLUE (TOWEL DISPOSABLE) ×2 IMPLANT
TRAY FOLEY BAG SILVER LF 14FR (SET/KITS/TRAYS/PACK) IMPLANT

## 2016-09-13 NOTE — Anesthesia Postprocedure Evaluation (Addendum)
Anesthesia Post Note  Patient: Elizabeth Yang  Procedure(s) Performed: Procedure(s) (LRB): Repeat CESAREAN SECTION (N/A)  Patient location during evaluation: PACU Anesthesia Type: Spinal Level of consciousness: awake and alert, oriented and patient cooperative Pain management: pain level controlled Vital Signs Assessment: post-procedure vital signs reviewed and stable Respiratory status: spontaneous breathing, nonlabored ventilation and respiratory function stable Cardiovascular status: blood pressure returned to baseline and stable Postop Assessment: no signs of nausea or vomiting, patient able to bend at knees and spinal receding Anesthetic complications: no        Last Vitals:  Vitals:   09/13/16 1730 09/13/16 1731  BP: 120/82   Pulse: 72 69  Resp: 16 16  Temp:      Last Pain:  Vitals:   09/13/16 1731  TempSrc:   PainSc: 0-No pain   Pain Goal:                 Taylon Coole,E. Tasha Jindra

## 2016-09-13 NOTE — Anesthesia Procedure Notes (Signed)
Spinal  Patient location during procedure: OB End time: 09/13/2016 3:18 PM Staffing Anesthesiologist: Jairo Ben Performed: anesthesiologist  Preanesthetic Checklist Completed: patient identified, surgical consent, pre-op evaluation, timeout performed, IV checked, risks and benefits discussed and monitors and equipment checked Spinal Block Patient position: sitting Prep: site prepped and draped and DuraPrep Patient monitoring: blood pressure, continuous pulse ox and heart rate Approach: midline Location: L3-4 Injection technique: single-shot Needle Needle type: Pencan  Needle gauge: 24 G Needle length: 9 cm Additional Notes Pt identified in Operating room.  Monitors applied. Working IV access confirmed. Sterile prep, drape lumbar spine.  1% lido local L 3,4.  #24ga Pencan into clear CSF L 3,4.   0.75% Bupivacaine with dextrose, fentanyl, morphine injected with asp CSF beginning and end of injection.  Patient asymptomatic, VSS, no heme aspirated, tolerated well.  Sandford Craze, MD

## 2016-09-13 NOTE — Anesthesia Preprocedure Evaluation (Addendum)
Anesthesia Evaluation  Patient identified by MRN, date of birth, ID band Patient awake    Reviewed: Allergy & Precautions, NPO status , Patient's Chart, lab work & pertinent test results, reviewed documented beta blocker date and time   History of Anesthesia Complications Negative for: history of anesthetic complications  Airway Mallampati: IV  TM Distance: >3 FB Neck ROM: Full    Dental  (+) Dental Advisory Given   Pulmonary asthma ,    breath sounds clear to auscultation       Cardiovascular hypertension, Pt. on medications (-) angina Rhythm:Regular Rate:Normal     Neuro/Psych  Headaches, negative psych ROS   GI/Hepatic Neg liver ROS, GERD  Medicated and Poorly Controlled,  Endo/Other  Morbid obesity  Renal/GU negative Renal ROS     Musculoskeletal   Abdominal (+) + obese,   Peds  Hematology negative hematology ROS (+) plt 208k   Anesthesia Other Findings   Reproductive/Obstetrics (+) Pregnancy                           Anesthesia Physical Anesthesia Plan  ASA: II  Anesthesia Plan: Spinal   Post-op Pain Management:    Induction:   Airway Management Planned: Natural Airway  Additional Equipment:   Intra-op Plan:   Post-operative Plan:   Informed Consent: I have reviewed the patients History and Physical, chart, labs and discussed the procedure including the risks, benefits and alternatives for the proposed anesthesia with the patient or authorized representative who has indicated his/her understanding and acceptance.   Dental advisory given  Plan Discussed with: CRNA and Surgeon  Anesthesia Plan Comments: (Plan routine monitors, SAB)        Anesthesia Quick Evaluation

## 2016-09-13 NOTE — Transfer of Care (Signed)
Immediate Anesthesia Transfer of Care Note  Patient: Elizabeth Yang  Procedure(s) Performed: Procedure(s) with comments: Repeat CESAREAN SECTION (N/A) - EDD: 09/26/16   Patient Location: PACU  Anesthesia Type:Spinal  Level of Consciousness: awake, alert  and oriented  Airway & Oxygen Therapy: Patient Spontanous Breathing  Post-op Assessment: Report given to RN and Post -op Vital signs reviewed and stable  Post vital signs: Reviewed and stable  Last Vitals:  Vitals:   09/13/16 1340  BP: 122/81  Pulse: 80  Resp: 18  Temp: 36.8 C    Last Pain:  Vitals:   09/13/16 1411  TempSrc:   PainSc: 0-No pain         Complications: No apparent anesthesia complications

## 2016-09-13 NOTE — Lactation Note (Addendum)
This note was copied from a baby's chart. Lactation Consultation Note  Patient Name: Elizabeth Yang [id number] Date: 09/13/2016 Reason for consult: Initial assessment   Initial assessment with mom of 1 hour old infant in PACU. Maternal history of HTN. Mom reports she pumped for 1 week for her first child who was born 3 months early and lived 3 weeks. She reports she had a good milk supply.   Infant STS with mom and CN nursery nurse attempting to latch infant. Mom with large compressible breasts and semi compressible areola with short shaft everted nipples. Nipple tend to flatten some with compression. Large gtts Colostrum easily expressible from both breasts. Able to latch infant with the teacup hold and good head a breast support. Infant was on and off the breast the laid back cross cradle hold on both breasts. Showed mom and dad how to latch infant using the teacup hold. Infant was placed in dad's arms at the end of the feeding.   Due to difficulty with infant sustaining latch, will see how feedings go once mom able to sit up more and if still difficult for infant to maintain latch may need to consider use of NS, discussed with mom and Claudina Lick, RN.   Enc mom to feed infant STS 8-12 x in 24 hours using both breasts with each feeding. Enc mom to hand express before feeding to get milk flowing and after feeding to offer infant spoon feeding if colostrum available. Mom was able to hand express independently. Enc mom to keep infant STS as much as possible. Discussed BF basics, supply and demand,  colostrum, supply and demand, milk coming to volume, STS, spoon feeding, hand expression, NB feeding behaviors, awakening techniques and NB nutritional needs. Enc mom to call out for feeding assistance as needed. Feeding log given with instructions for use.   BF Resources handout and LC Brochure given, mom informed of IP/OP Services, BF Support Groups and LC phone #. Enc mom to call out for assistance as  needed.   Mom is getting a pump from her sister, she is unsure of what kind. Mom asked if she should begin pumping, enc her to focus on feeding infant at this time and if there is a medical need to pump then we can address it. Mom without further questions/concerns at this time. Report to Claudina Lick, RN.     Maternal Data Formula Feeding for Exclusion: No Has patient been taught Hand Expression?: Yes Does the patient have breastfeeding experience prior to this delivery?: Yes  Feeding Feeding Type: Breast Fed Length of feed: 20 min  LATCH Score/Interventions Latch: Repeated attempts needed to sustain latch, nipple held in mouth throughout feeding, stimulation needed to elicit sucking reflex. Intervention(s): Adjust position;Assist with latch;Breast massage;Breast compression  Audible Swallowing: A few with stimulation Intervention(s): Alternate breast massage;Hand expression;Skin to skin  Type of Nipple: Everted at rest and after stimulation  Comfort (Breast/Nipple): Soft / non-tender     Hold (Positioning): Assistance needed to correctly position infant at breast and maintain latch. Intervention(s): Breastfeeding basics reviewed;Support Pillows;Position options;Skin to skin  LATCH Score: 7  Lactation Tools Discussed/Used WIC Program: No   Consult Status Consult Status: Follow-up Date: 09/14/16 Follow-up type: In-patient    Silas Flood Lelend Heinecke 09/13/2016, 5:48 PM

## 2016-09-13 NOTE — H&P (Signed)
Elizabeth Yang is a 36 y.o. female G2P0100, presenting for Repeat C-section at 38 wks for severe CHTN affecting pregnancy.  G1- Severe IUGR. CHTN superimposed severe preeclampsia, Abnormal Dopplers needing preterm C/s at 28 wks, severe SGA infant, died few days after birth.  Her BP was better controlled this time starting pre-pregnancy, she is on dual agent therapy- starting with Labetalol  bid and Amlodipine  daily, increased to Labetalol  tid and Amlodipine  daily which controlled BPs reasonably well. SOB/ CP/ blurred visions episodes off and on recently, few MAU and several office visits for off and on high BPs in 150s-160s/ 80-90s. She was taken out of work since 3 weeks which helped BPs. She was seen last night again for HA, blurred vision, chest pressure and was sent home after eval.  BTMZ at 36 wks due to symptoms getting worse.  Serial growth sono every 4 weeks from 24 wks, Antenatal testing with NST/BPP from 28 wks and have been normal. PIH labs normal, 24 hr urine prot normal with no diagnosis of preeclampsia   OB History    Gravida Para Term Preterm AB Living   0   SAB TAB Ectopic Multiple Live Births         0 1     Past Medical History:  Diagnosis Date  . Anxiety   . Asthma   . Elevated blood pressure affecting pregnancy in second trimester, antepartum 09/24/2014  . Headache   . History of kidney stones   . Hypertension   . Hypertension affecting pregnancy, antepartum 09/13/2016  . Postpartum care following cesarean delivery (4/25) 09/27/2014  . Vaginal Pap smear, abnormal   . Vitamin D deficiency    Past Surgical History:  Procedure Laterality Date  . CESAREAN SECTION N/A 09/27/2014   Procedure: CESAREAN SECTION;  Surgeon: Genia Del, MD;  Location: [facility];  Service: Obstetrics;  Laterality: N/A;  . TONSILLECTOMY    . WISDOM TOOTH EXTRACTION     Family History: family history includes Cancer in her maternal grandmother; Diabetes in her  paternal grandmother; Heart disease in her maternal aunt, maternal grandfather, maternal uncle, and mother; Hypertension in her mother; Kidney disease in her sister. Social History:  reports that she has never smoked. She has never used smokeless tobacco. She reports that she does not drink alcohol or use drugs.     Maternal Diabetes: No Genetic Screening: Normal Ultrascreen normal, AFP1 normal  Maternal Ultrasounds/Referrals: Normal Fetal Ultrasounds or other Referrals:  None Maternal Substance Abuse:  No Significant Maternal Medications:  Meds include: Other:  Labetalol  tid, Amlodipine  bbASA, Pepcid Significant Maternal Lab Results:  Lab values include: Group B Strep negative Other Comments:  None  ROS  SOB/ HA/ CP/ dizziness / blurred vision off and on  History   unknown if currently breastfeeding. Exam Physical Exam  BP 122/81 (BP Location: Right Arm)   Pulse 80   Temp 98.3 F (36.8 C) (Oral)   Resp 18   Ht  (1.575 m)   Wt 209 lb (94.8 kg)   SpO2 98%   Breastfeeding? Yes   BMI 38.23 kg/m   A&O x 3, no acute distress. Pleasant HEENT neg, no thyromegaly Lungs CTA bilat CV RRR, S1S2 normal Abdo soft, non tender, non acute Extr no edema/ tenderness DTR+2 Pelvic Cx closed, long, high station FHT  140s Toco none  Prenatal labs: ABO, Rh: --/--/A POS (04/11 1125) Antibody: NEG (04/11 1125) Rubella:  Immune (09/20 0000) RPR: Non Reactive (04/11 1125)  HBsAg: Negative (09/20 0000)  HIV: Non-reactive (09/20 0000)  GBS: Negative (03/02 0000)   Assessment/Plan: 36 yo G2P0100, at 38.1 wks with Chronic HTN, worsening in pregnancy, on dual antiHTN agent, here for repeat elective C/s at 38 wks due to [facility], poor control and severe maternal symptoms.  This is AGA infant. Cervix is not favorable for TOLAC. Proceed with Repeat C/section Risks/complications of surgery reviewed incl infection, bleeding, damage to internal organs including bladder, bowels, ureters,  blood vessels, other risks from anesthesia, VTE and delayed complications of any surgery, complications in future surgery reviewed. Also discussed neonatal complications incl difficult delivery, laceration, vacuum assistance, TTN etc. Pt understands and agrees, all concerns addressed.    Xzaviar Maloof R 09/13/2016, 1:33 PM

## 2016-09-13 NOTE — Op Note (Signed)
Repeat Low transverse Cesarean Section Procedure Note 09/13/2016  Elizabeth Yang  Indications: 36 yo female with pre-gestational chronic hypertension on dual agents, worsening in pregnancy. 38. 1 weeks.  She had history of first C-section at 28 weeks for severe IUGR with abnormal dopplers and severe preeclampsia with neonatal demise. Hence based on need for dual agents for BP control, recurrent symptoms of headaches, shortness of breath, dizziness, she was taken out of work and improved blood pressures and symptoms to some extent but based on that decision was made to deliver at 38 weeks  Pre-operative Diagnosis: Previous Cesarean Section, Chronic Hypertension affecting pregnancy, [redacted]w[redacted]d.   Post-operative Diagnosis: Same   Surgeon: Shea Evans, MD  Assistants: Marlinda Mike, CNM and Carlean Jews, CNM (in training)  Anesthesia: spinal   Procedure Details:  The patient was seen in the Holding Room. The risks, benefits, complications, treatment options, and expected outcomes were discussed with the patient. The patient concurred with the proposed plan, giving informed consent. identified as Elizabeth Yang and the procedure verified as C-Section Delivery. A Time Out was held and the above information confirmed. 2 gm Ancef given.  After induction of anesthesia, the patient was draped and prepped in the usual sterile manner. Foley was draining well. A Pfannenstiel Incision was made and carried down through the subcutaneous tissue to the fascia. Fascial incision was made and extended transversely. The fascia was separated from the underlying rectus tissue superiorly and inferiorly. The peritoneum was identified and entered. Peritoneal incision was extended longitudinally. Bladder adhesions noted to upper peritoneal area, separated with careful dissection. Alexis O retractor placed. The utero-vesical peritoneal reflection was incised transversely and the bladder flap was bluntly freed from the  lower uterine segment. A low transverse uterine incision was made. Clear amniotic fluid drained. Delivered from cephalic presentation was a vigorous FEMALE infant at 15.53 hours on 09/13/2016 with Apgar scores of 8 at one minute and 9 at five minutes. Loose nuchal cord released at head delivery. Delayed cord clamping done at 1 minute. Baby handed to NICU team.  Cord ph was not sent. Umbilical cord blood was obtained for evaluation. The placenta was removed Intact and appeared normal. The uterine outline, tubes and ovaries appeared normal}. The uterine incision was closed with running locked sutures of Monocryl followed by a second imbricating layer. Hemostasis was observed. Alexis retractor removed. Peritoneal closure done with 2-0 Vicryl. Muscles approximated in midline to prevent bladder herniation towards abdominal wall. The fascia was then reapproximated with running sutures of 0Vicryl. The subcuticular closure was performed using 2-0plain gut. The skin was closed with 4-0Vicryl. Sterile dressing placed.   Instrument, sponge, and needle counts were correct prior the abdominal closure and were correct at the conclusion of the case.    Findings:  FEMALE Infant delivered cephalic from low transverse hysterotomy at 15.53 hours on 09/13/16 with Apgars 8 and 9. Normal uterus, tubes, ovaries and placenta and cord.    Estimated Blood Loss: 500 cc  Total IV Fluids: 1300 ml LR   Urine Output: 200CC OF clear urine  Specimens: Cord blood   Complications: no complications  Disposition: PACU - hemodynamically stable.   Maternal Condition: stable   Baby condition / location:  Couplet care / Skin to Skin  Attending Attestation: I performed the procedure.   Signed: Surgeon(s): Shea Evans, MD

## 2016-09-13 NOTE — Discharge Instructions (Signed)
Hypertension During Pregnancy °Hypertension, commonly called high blood pressure, is when the force of blood pumping through your arteries is too strong. Arteries are blood vessels that carry blood from the heart throughout the body. Hypertension during pregnancy can cause problems for you and your baby. Your baby may be born early (prematurely) or may not weigh as much as he or she should at birth. Very bad cases of hypertension during pregnancy can be life-threatening. °Different types of hypertension can occur during pregnancy. These include: °· Chronic hypertension. This happens when: °¨ You have hypertension before pregnancy and it continues during pregnancy. °¨ You develop hypertension before you are [redacted] weeks pregnant, and it continues during pregnancy. °· Gestational hypertension. This is hypertension that develops after the 20th week of pregnancy. °· Preeclampsia, also called toxemia of pregnancy. This is a very serious type of hypertension that develops only during pregnancy. It affects the whole body, and it can be very dangerous for you and your baby. °Gestational hypertension and preeclampsia usually go away within 6 weeks after your baby is born. Women who have hypertension during pregnancy have a greater chance of developing hypertension later in life or during future pregnancies. °What are the causes? °The exact cause of hypertension is not known. °What increases the risk? °There are certain factors that make it more likely for you to develop hypertension during pregnancy. These include: °· Having hypertension during a previous pregnancy or prior to pregnancy. °· Being overweight. °· Being older than age 40. °· Being pregnant for the first time or being pregnant with more than one baby. °· Becoming pregnant using fertilization methods such as IVF (in vitro fertilization). °· Having diabetes, kidney problems, or systemic lupus erythematosus. °· Having a family history of hypertension. °What are the  signs or symptoms? °Chronic hypertension and gestational hypertension rarely cause symptoms. Preeclampsia causes symptoms, which may include: °· Increased protein in your urine. Your health care provider will check for this at every visit before you give birth (prenatal visit). °· Severe headaches. °· Sudden weight gain. °· Swelling of the hands, face, legs, and feet. °· Nausea and vomiting. °· Vision problems, such as blurred or double vision. °· Numbness in the face, arms, legs, and feet. °· Dizziness. °· Slurred speech. °· Sensitivity to bright lights. °· Abdominal pain. °· Convulsions. °How is this diagnosed? °You may be diagnosed with hypertension during a routine prenatal exam. At each prenatal visit, you may: °· Have a urine test to check for high amounts of protein in your urine. °· Have your blood pressure checked. A blood pressure reading is recorded as two numbers, such as "120 over 80" (or 120/80). The first ("top") number is called the systolic pressure. It is a measure of the pressure in your arteries when your heart beats. The second ("bottom") number is called the diastolic pressure. It is a measure of the pressure in your arteries as your heart relaxes between beats. Blood pressure is measured in a unit called mm Hg. A normal blood pressure reading is: °¨ Systolic: below 120. °¨ Diastolic: below 80. °The type of hypertension that you are diagnosed with depends on your test results and when your symptoms developed. °· Chronic hypertension is usually diagnosed before 20 weeks of pregnancy. °· Gestational hypertension is usually diagnosed after 20 weeks of pregnancy. °· Hypertension with high amounts of protein in the urine is diagnosed as preeclampsia. °· Blood pressure measurements that stay above 160 systolic, or above 110 diastolic, are signs of severe preeclampsia. °  How is this treated? °Treatment for hypertension during pregnancy varies depending on the type of hypertension you have and how  serious it is. °· If you take medicines called ACE inhibitors to treat chronic hypertension, you may need to switch medicines. ACE inhibitors should not be taken during pregnancy. °· If you have gestational hypertension, you may need to take blood pressure medicine. °· If you are at risk for preeclampsia, your health care provider may recommend that you take a low-dose aspirin every day to prevent high blood pressure during your pregnancy. °· If you have severe preeclampsia, you may need to be hospitalized so you and your baby can be monitored closely. You may also need to take medicine (magnesium sulfate) to prevent seizures and to lower blood pressure. This medicine may be given as an injection or through an IV tube. °· In some cases, if your condition gets worse, you may need to deliver your baby early. °Follow these instructions at home: °Eating and drinking °· Drink enough fluid to keep your urine clear or pale yellow. °· Eat a healthy diet that is low in salt (sodium). Do not add salt to your food. Check food labels to see how much sodium a food or beverage contains. °Lifestyle °· Do not use any products that contain nicotine or tobacco, such as cigarettes and e-cigarettes. If you need help quitting, ask your health care provider. °· Do not use alcohol. °· Avoid caffeine. °· Avoid stress as much as possible. Rest and get plenty of sleep. °General instructions °· Take over-the-counter and prescription medicines only as told by your health care provider. °· While lying down, lie on your left side. This keeps pressure off your baby. °· While sitting or lying down, raise (elevate) your feet. Try putting some pillows under your lower legs. °· Exercise regularly. Ask your health care provider what kinds of exercise are best for you. °· Keep all prenatal and follow-up visits as told by your health care provider. This is important. °Contact a health care provider if: °· You have symptoms that your health care provider  told you may require more treatment or monitoring, such as: °¨ Fever. °¨ Vomiting. °¨ Headache. °Get help right away if: °· You have severe abdominal pain or vomiting that does not get better with treatment. °· You suddenly develop swelling in your hands, ankles, or face. °· You gain 4 lbs (1.8 kg) or more in 1 week. °· You develop vaginal bleeding, or you have blood in your urine. °· You do not feel your baby moving as much as usual. °· You have blurred or double vision. °· You have muscle twitching or sudden tightening (spasms). °· You have shortness of breath. °· Your lips or fingernails turn blue. °This information is not intended to replace advice given to you by your health care provider. Make sure you discuss any questions you have with your health care provider. °Document Released: 02/06/2011 Document Revised: 12/09/2015 Document Reviewed: 11/04/2015 °Elsevier Interactive Patient Education © 2017 [employer] ° °

## 2016-09-14 LAB — CBC
HCT: 32.9 % — ABNORMAL LOW (ref 36.0–46.0)
HEMOGLOBIN: 11.3 g/dL — AB (ref 12.0–15.0)
MCH: 31.1 pg (ref 26.0–34.0)
MCHC: 34.3 g/dL (ref 30.0–36.0)
MCV: 90.6 fL (ref 78.0–100.0)
Platelets: 191 10*3/uL (ref 150–400)
RBC: 3.63 MIL/uL — ABNORMAL LOW (ref 3.87–5.11)
RDW: 13.9 % (ref 11.5–15.5)
WBC: 13.3 10*3/uL — ABNORMAL HIGH (ref 4.0–10.5)

## 2016-09-14 LAB — BIRTH TISSUE RECOVERY COLLECTION (PLACENTA DONATION)

## 2016-09-14 NOTE — Progress Notes (Signed)
Post Partum Day 1, POS#1 RC/s - (** 1st baby born 28 wks and Neo death) Subjective: no complaints, up ad lib, voiding and tolerating PO   Pain well controlled. No HA/ SOB/ Dizziness  Objective: Blood pressure 129/81, pulse 85, temperature 98.4 F (36.9 C), temperature source Oral, resp. rate 16, height  (1.575 m), weight 209 lb (94.8 kg), SpO2 95 %, currently breastfeeding.  Physical Exam:  General: alert and cooperative Lochia: appropriate Uterine Fundus: firm, active BS. Soft, no R/G Incision: dressing dry and intact DVT Evaluation: No evidence of DVT seen on physical exam. Negative Homan's sign.   Recent Labs  09/13/16 1440 09/14/16 0504  HGB 12.6 11.3*  HCT 36.8 32.9*  A(+)  Rub Imm    BOY- 6'7", Hypospadias. No CIRC.    Assessment/Plan: Breastfeeding and Lactation consult  Feels well and happy.  CHTN, Labetalol  tid and Amlodipine  daily, continue both, will taper outpatient    LOS: 1 day   Elizabeth Yang R 09/14/2016, 1:45 PM

## 2016-09-14 NOTE — Progress Notes (Signed)
CSW met with [name] in her first floor room/144 to offer support and complete assessment due to hx of anxiety.  Hx of neonatal demise.  MGF was present when CSW arrived and CSW offered to return at a later time as [name] had visitors.  FOB was also present.  [name] replied that CSW could stay and talk about anything now.  CSW found parents to be quiet and difficult to engage.  [name] reports that she is feeling well emotionally at this point and is not interested in counseling at this time.  She reports having symptoms of anxiety controlled with Klonopin PRN.  She reports no use of Klonopin during pregnancy.  She did not appear concerned about her anxiety symptoms or interested in discussing it further.  CSW provided education regarding signs and symptoms of PMADs and provided [name] with support groups held at [facility] and a new mom checklist as a way to self-evaluate.  [name] states she feels comfortable talking with her doctor if symptoms arise.  CSW is concerned that [name] is at a higher risk of developing PMADs given her hx of Anxiety and hx of infant loss.  CSW did not discuss the loss of their son, as this was not something they disclosed to CSW.   When asking if the family had any questions or concerns they would like to discuss with CSW, MGF stated that there are "trainees" here and that he is fine with them evaluating [name], but "when it comes to the baby, we only want experienced staff to be caring for him."  CSW ensured them that all trainees are overseen by experienced staff, but offered to make their RN aware.  FOB then said, "do you know when a pediatrician will be in?"  CSW asked who their doctor is and they replied that they will have follow up at [facility].  CSW offered to let CN staff know that they are asking about their pediatrician.  CSW notes that baby was seen by Teaching Service staff and informed bedside RN as well as Teaching Service Pediatrician of the conversation with family.   CSW  identifies no barriers to discharge and no further intervention needed at this time.

## 2016-09-14 NOTE — Progress Notes (Signed)
Patient encouraged to drink fluids and ambulate halls. Patient states she is passing gas. Pain medications availability told. Patient told to  Call out for meds.

## 2016-09-14 NOTE — Lactation Note (Signed)
This note was copied from a baby's chart. Lactation Consultation Note  Patient Name: Elizabeth Yang [id number] Date: 09/14/2016 Reason for consult: Follow-up assessment Mom called out with feeding concerns.  Mom states she is still having some difficulty with latch and she is also concerned baby isn't getting enough.  Discussed with parents that we are monitoring output and weight and thus far all is WNL.  Baby has also been satisfied with feedings.  Mom can easily hand express large drops of colostrum.  Baby placed skin to skin in football hold.  Instructed mom and FOB on good positioning and breast support.  Parents shown how to compress tissue for an easier latch.  Baby latched well after a few attempts and nursed actively.  Instructed on using waking techniques and breast massage during feeding.  Mom seems more confident.  Encouraged to call out for concerns/assist.  Maternal Data Has patient been taught Hand Expression?: Yes  Feeding Feeding Type: Breast Fed Length of feed: 20 min  LATCH Score/Interventions Latch: Grasps breast easily, tongue down, lips flanged, rhythmical sucking. Intervention(s): Adjust position;Assist with latch;Breast massage;Breast compression  Audible Swallowing: A few with stimulation Intervention(s): Alternate breast massage;Hand expression;Skin to skin  Type of Nipple: Everted at rest and after stimulation  Comfort (Breast/Nipple): Soft / non-tender     Hold (Positioning): Assistance needed to correctly position infant at breast and maintain latch. Intervention(s): Breastfeeding basics reviewed;Support Pillows;Position options;Skin to skin  LATCH Score: 8  Lactation Tools Discussed/Used     Consult Status Consult Status: Follow-up Date: 09/15/16 Follow-up type: In-patient    Huston Foley 09/14/2016, 3:08 PM

## 2016-09-15 MED ORDER — IBUPROFEN 600 MG PO TABS: 600.0000 mg | ORAL_TABLET | Freq: Four times a day (QID) | ORAL | 0 refills | Status: DC

## 2016-09-15 MED ORDER — OXYCODONE HCL 10 MG PO TABS: 10.0000 mg | ORAL_TABLET | ORAL | 0 refills | Status: DC | PRN

## 2016-09-15 MED ORDER — COCONUT OIL OIL: 1.0000 "application " | TOPICAL_OIL | 0 refills | Status: DC | PRN

## 2016-09-15 NOTE — Discharge Summary (Signed)
OB Discharge Summary     Patient Name: Elizabeth Yang DOB: [birth year] MRN: [id number]  Date of admission: 09/13/2016 Delivering MD: MODY, VAISHALI   Date of discharge: 09/15/2016  Admitting diagnosis: PREG Previous Cesarean Section, Chronic Hypertension Intrauterine pregnancy: [redacted]w[redacted]d     Secondary diagnosis:  Principal Problem:   Postpartum care following cesarean delivery (4/12) Active Problems:   Hypertension affecting pregnancy, antepartum   Previous cesarean delivery, delivered   Cesarean delivery delivered      Discharge diagnosis: Term Pregnancy Delivered and CHTN                                                                                                 Complications: None  Hospital course:  Sceduled C/S   36 y.o. yo G2P1100 at [redacted]w[redacted]d was admitted to the hospital 09/13/2016 for scheduled cesarean section with the following indication:Elective Repeat.  Membrane Rupture Time/Date: 3:52 PM ,09/13/2016   Patient delivered a Viable infant boy.09/13/2016  Details of operation can be found in separate operative note.  Patient had an uncomplicated postpartum course.  She is ambulating, tolerating a regular diet, passing flatus, and urinating well. Patient is discharged home in stable condition on  09/15/16         Physical exam  Vitals:   09/14/16 1625 09/14/16 1812 09/14/16 2217 09/15/16 0553  BP: 126/73 119/71 125/89 125/77  Pulse: 75 82 85 82  Resp: Temp: 98.5 F (36.9 C) 98.2 F (36.8 C)  98.3 F (36.8 C)  TempSrc: Axillary Oral    SpO2:      Weight:      Height:       General: alert, cooperative and no distress Lochia: appropriate Uterine Fundus: firm Incision: Healing well with no significant drainage DVT Evaluation: No cords or calf tenderness. No significant calf/ankle edema. Labs: Lab Results  Component Value Date   WBC 13.3 (H) 09/14/2016   HGB 11.3 (L) 09/14/2016   HCT 32.9 (L) 09/14/2016   MCV 90.6 09/14/2016   PLT 191 09/14/2016    CMP Latest Ref Rng & Units 09/12/2016  Glucose 65 - 99 mg/dL 85  BUN 6 - 20 mg/dL 15  Creatinine 4.09 - 8.11 mg/dL 9.14  Sodium 782 - 956 mmol/L 135  Potassium 3.5 - 5.1 mmol/L 4.3  Chloride 101 - 111 mmol/L 107  CO2 22 - 32 mmol/L 21(L)  Calcium 8.9 - 10.3 mg/dL 2.1(H)  Total Protein 6.5 - 8.1 g/dL 6.9  Total Bilirubin 0.3 - 1.2 mg/dL 0.6  Alkaline Phos 38 - 126 U/L 114  AST 15 - 41 U/L 15  ALT 14 - 54 U/L 23    Discharge instruction: per After Visit Summary and "Baby and Me Booklet".  After visit meds:  Allergies as of 09/15/2016   No Known Allergies     Medication List    TAKE these medications   albuterol 108 (90 Base) MCG/ACT inhaler Commonly known as:  PROVENTIL HFA;VENTOLIN HFA Inhale 2 puffs into the lungs every 6 (six) hours as needed for wheezing or shortness of breath.  amLODipine 10 MG tablet Commonly known as:  NORVASC Take 10 mg by mouth daily.   coconut oil Oil Apply 1 application topically as needed.   ibuprofen 600 MG tablet Commonly known as:  ADVIL,MOTRIN Take 1 tablet (600 mg total) by mouth every 6 (six) hours.   labetalol 300 MG tablet Commonly known as:  NORMODYNE Take 2 tablets (600 mg total) by mouth 3 (three) times daily. What changed:  how much to take   Magnesium 200 MG Tabs Take 200 mg by mouth daily.   Oxycodone HCl 10 MG Tabs Take 1 tablet (10 mg total) by mouth every 4 (four) hours as needed (pain scale > 7).   prenatal multivitamin Tabs tablet Take 1 tablet by mouth at bedtime.       Diet: routine diet  Activity: Advance as tolerated. Pelvic rest for 6 weeks.   Outpatient follow up:6 weeks Follow up Appt:No future appointments. Follow up Visit:No Follow-up on file.  Postpartum contraception: Not Discussed  Newborn Data: Live born female Cristal Deer Birth Weight: 6 lb 7.4 oz (2930 g) APGAR: 8, 9  Baby Feeding: Breast Disposition:home with mother   09/15/2016 Neta Mends, CNM

## 2016-09-15 NOTE — Progress Notes (Signed)
Subjective: POD# 2 Information for the patient's newborn:  Marnesha, Gagen [[id number]]  female   circ planned outpatient, hypospadias Baby name: Cristal Deer  Reports feeling well, desires DC home Feeding: breast Patient reports tolerating PO.  Breast symptoms: none Pain controlled with PO meds Denies HA/SOB/C/P/N/V/dizziness. Flatus present, + BM. She reports vaginal bleeding as normal, without clots.  She is ambulating, urinating without difficulty.     Objective:   VS:    Vitals:   09/14/16 1625 09/14/16 1812 09/14/16 2217 09/15/16 0553  BP: 126/73 119/71 125/89 125/77  Pulse: 75 82 85 82  Resp: Temp: 98.5 F (36.9 C) 98.2 F (36.8 C)  98.3 F (36.8 C)  TempSrc: Axillary Oral    SpO2:      Weight:      Height:         Intake/Output Summary (Last 24 hours) at 09/15/16 1020 Last data filed at 09/14/16 1526  Gross per 24 hour  Intake                0 ml  Output             1600 ml  Net            -1600 ml        Recent Labs  09/13/16 1440 09/14/16 0504  WBC 12.5* 13.3*  HGB 12.6 11.3*  HCT 36.8 32.9*  PLT 208 191     Blood type: --/--/A POS (04/11 1125)  Rubella: Immune (09/20 0000)     Physical Exam:  General: alert, cooperative and no distress Abdomen: soft, nontender, normal bowel sounds Incision: dry, intact and dressing soiled 1/3 Uterine Fundus: firm, below umbilicus, nontender Lochia: minimal Ext: edema +1 pedal, no cords or tenderness      Assessment/Plan: 36 y.o.   POD# 2. [id number]                  Principal Problem:   Postpartum care following cesarean delivery (4/12) Active Problems:   Hypertension affecting pregnancy, antepartum   Previous cesarean delivery, delivered   Cesarean delivery delivered   Doing well, stable.     Change HC dressing prior to DC   CHTN, Labetalol  tid and Amlodipine  daily, continue both, will taper outpatient                DC home today w/ instructions  F/U at [facility]  OB/GYN in 6 weeks and PRN  Neta Mends, CNM, MSN 09/15/2016, 10:20 AM

## 2016-09-16 ENCOUNTER — Ambulatory Visit: Payer: Self-pay

## 2016-09-16 NOTE — Lactation Note (Signed)
This note was copied from a baby's chart. Lactation Consultation Note  Patient Name: Elizabeth Yang [id number] Date: 09/16/2016 Reason for consult: Follow-up assessment  Baby latched upon entering at 29 hours old. Taught mother to flange bottom lip and compress breast during feeding to keep baby active. Suggest she compress breast during feeding to keep baby active and breastfeed on both breasts every feeding. Mom encouraged to feed baby 8-12 times/24 hours and with feeding cues.  Wake baby for feedings by placing STS. Reviewed engorgement care and monitoring voids/stools.     Maternal Data    Feeding Feeding Type: Breast Fed  LATCH Score/Interventions Latch: Grasps breast easily, tongue down, lips flanged, rhythmical sucking. (latched ) Intervention(s): Breast compression  Audible Swallowing: A few with stimulation Intervention(s): Alternate breast massage  Type of Nipple: Everted at rest and after stimulation  Comfort (Breast/Nipple): Filling, red/small blisters or bruises, mild/mod discomfort     Hold (Positioning): No assistance needed to correctly position infant at breast.  LATCH Score: 8  Lactation Tools Discussed/Used     Consult Status Consult Status: Complete    Hardie Pulley 09/16/2016, 10:16 AM

## 2016-09-27 ENCOUNTER — Encounter (HOSPITAL_COMMUNITY): Payer: Self-pay | Admitting: *Deleted

## 2016-12-17 NOTE — Addendum Note (Signed)
Addendum  created 12/17/16 1741 by Jeffery Bachmeier, MD   Sign clinical note    

## 2017-05-14 ENCOUNTER — Encounter (HOSPITAL_COMMUNITY): Payer: Self-pay

## 2017-05-14 ENCOUNTER — Other Ambulatory Visit: Payer: Self-pay

## 2017-05-14 ENCOUNTER — Emergency Department (HOSPITAL_COMMUNITY)
Admission: EM | Admit: 2017-05-14 | Discharge: 2017-05-14 | Disposition: A | Discharge status: Home / Self Care | Payer: BC Managed Care – PPO | Attending: Emergency Medicine | Admitting: Emergency Medicine

## 2017-05-14 ENCOUNTER — Emergency Department (HOSPITAL_COMMUNITY): Payer: BC Managed Care – PPO

## 2017-05-14 DIAGNOSIS — J111 Influenza due to unidentified influenza virus with other respiratory manifestations: Secondary | ICD-10-CM | POA: Diagnosis not present

## 2017-05-14 DIAGNOSIS — I10 Essential (primary) hypertension: Secondary | ICD-10-CM | POA: Diagnosis not present

## 2017-05-14 DIAGNOSIS — F419 Anxiety disorder, unspecified: Secondary | ICD-10-CM | POA: Diagnosis not present

## 2017-05-14 DIAGNOSIS — Z79899 Other long term (current) drug therapy: Secondary | ICD-10-CM | POA: Insufficient documentation

## 2017-05-14 DIAGNOSIS — R0600 Dyspnea, unspecified: Secondary | ICD-10-CM | POA: Diagnosis present

## 2017-05-14 DIAGNOSIS — R6889 Other general symptoms and signs: Secondary | ICD-10-CM

## 2017-05-14 DIAGNOSIS — J4521 Mild intermittent asthma with (acute) exacerbation: Secondary | ICD-10-CM | POA: Insufficient documentation

## 2017-05-14 LAB — RAPID STREP SCREEN (MED CTR MEBANE ONLY): Streptococcus, Group A Screen (Direct): NEGATIVE

## 2017-05-14 MED ORDER — ALBUTEROL SULFATE HFA 108 (90 BASE) MCG/ACT IN AERS
2.0000 | INHALATION_SPRAY | Freq: Once | RESPIRATORY_TRACT | Status: AC
  Administered 2017-05-14: 2 via RESPIRATORY_TRACT
  Filled 2017-05-14: qty 6.7

## 2017-05-14 MED ORDER — IPRATROPIUM-ALBUTEROL 0.5-2.5 (3) MG/3ML IN SOLN: 3.0000 mL | Freq: Once | RESPIRATORY_TRACT | Status: DC

## 2017-05-14 MED ORDER — DEXAMETHASONE SODIUM PHOSPHATE 10 MG/ML IJ SOLN
10.0000 mg | Freq: Once | INTRAMUSCULAR | Status: AC
  Administered 2017-05-14: 10 mg via INTRAMUSCULAR
  Filled 2017-05-14: qty 1

## 2017-05-14 NOTE — ED Provider Notes (Signed)
[facility] EMERGENCY DEPARTMENT Provider Note   CSN: [id number] Arrival date & time: 05/14/17  1221     History   Chief Complaint Chief Complaint  Patient presents with  . cough/sore throat    HPI Elizabeth Yang is a 36 y.o. female.   Elizabeth Yang is a 36 year old female with history of asthma presenting with her 70-month-old female who is being seen in the pediatric ED for respiratory symptoms who is also complaining about cough, congestion and difficulty breathing for the past 2-3 days.  She states that her and her husband have both become sick over the past couple of days with upper respiratory symptoms.  She has no documented fever however feels as though she has had chills.  Has also been complaining of a sore throat and cough productive with yellow sputum. She denies any nausea, vomiting or diarrhea.  She has had no difficulty with p.o. intake and has had no decrease in output.       Past Medical History:  Diagnosis Date  . Anxiety   . Asthma   . Elevated blood pressure affecting pregnancy in second trimester, antepartum 09/24/2014  . Headache   . History of kidney stones   . Hypertension   . Hypertension affecting pregnancy, antepartum 09/13/2016  . Postpartum care following cesarean delivery (4/25) 09/27/2014  . Vaginal Pap smear, abnormal   . Vitamin D deficiency     Patient Active Problem List   Diagnosis Date Noted  . Hypertension affecting pregnancy, antepartum 09/13/2016  . Previous cesarean delivery, delivered 09/13/2016  . Postpartum care following cesarean delivery (4/12) 09/13/2016  . Cesarean delivery delivered 09/13/2016    Past Surgical History:  Procedure Laterality Date  . CESAREAN SECTION N/A 09/27/2014   Procedure: CESAREAN SECTION;  Surgeon: Genia Del, MD;  Location: [facility];  Service: Obstetrics;  Laterality: N/A;  . CESAREAN SECTION N/A 09/13/2016   Procedure: Repeat CESAREAN SECTION;  Surgeon: Shea Evans, MD;   Location: [facility] BIRTHING SUITES;  Service: Obstetrics;  Laterality: N/A;  EDD: 09/26/16   . TONSILLECTOMY    . WISDOM TOOTH EXTRACTION      OB History    Gravida Para Term Preterm AB Living   2 2 1 1  0 0   SAB TAB Ectopic Multiple Live Births   0 0 0 0 1       Home Medications    Prior to Admission medications   Medication Sig Start Date End Date Taking? Authorizing Provider  albuterol (PROVENTIL HFA;VENTOLIN HFA) 108 (90 BASE) MCG/ACT inhaler Inhale 2 puffs into the lungs every 6 (six) hours as needed for wheezing or shortness of breath.    [provider]  amLODipine (NORVASC) 10 MG tablet Take 10 mg by mouth daily.    [provider]  coconut oil OIL Apply 1 application topically as needed. 09/15/16   Neta Mends, CNM  ibuprofen (ADVIL,MOTRIN) 600 MG tablet Take 1 tablet (600 mg total) by mouth every 6 (six) hours. 09/15/16   Neta Mends, CNM  labetalol (NORMODYNE) 300 MG tablet Take 2 tablets (600 mg total) by mouth 3 (three) times daily. Patient taking differently: Take 300 mg by mouth 3 (three) times daily.  10/02/14   Shea Evans, MD  Magnesium 200 MG TABS Take 200 mg by mouth daily.    [provider]  oxyCODONE 10 MG TABS Take 1 tablet (10 mg total) by mouth every 4 (four) hours as needed (pain scale > 7). 09/15/16  Neta MendsPaul, Daniela C, CNM  Prenatal Vit-Fe Fumarate-FA (PRENATAL MULTIVITAMIN) TABS tablet Take 1 tablet by mouth at bedtime.     [provider]    Family History Family History  Problem Relation Age of Onset  . Heart disease Mother   . Hypertension Mother   . Kidney disease Sister   . Heart disease Maternal Aunt   . Heart disease Maternal Uncle   . Cancer Maternal Grandmother   . Heart disease Maternal Grandfather   . Diabetes Paternal Grandmother     Social History Social History   Tobacco Use  . Smoking status: Never Smoker  . Smokeless tobacco: Never Used  Substance Use Topics  . Alcohol use: No  . Drug  use: No     Allergies   Patient has no known allergies.   Review of Systems Review of Systems  Constitutional: Positive for fatigue.  HENT: Positive for congestion, sinus pressure, sinus pain and sore throat.   Eyes: Negative.   Respiratory: Positive for cough and shortness of breath. Negative for wheezing.   Cardiovascular: Negative.   Gastrointestinal: Negative.   Endocrine: Negative.   Genitourinary: Negative.   Musculoskeletal: Negative.   Allergic/Immunologic: Negative.   Neurological: Negative.      Physical Exam Updated Vital Signs BP 135/88 (BP Location: Right Arm)   Pulse 87   Temp 99 F (37.2 C) (Oral)   Resp 20   Ht 5\' 2"  (1.575 m)   Wt 81.6 kg (180 lb)   SpO2 93%   BMI 32.92 kg/m   Physical Exam  Constitutional: She is oriented to person, place, and time. She appears well-developed and well-nourished. No distress.  HENT:  Head: Normocephalic and atraumatic.  Right Ear: External ear normal.  Left Ear: External ear normal.  Oropharynx with erythema and no exudates  Eyes: Conjunctivae and EOM are normal. Pupils are equal, round, and reactive to light. Right eye exhibits no discharge. Left eye exhibits no discharge. No scleral icterus.  Neck: Normal range of motion. Neck supple.  Cardiovascular: Normal rate and regular rhythm. Exam reveals no gallop and no friction rub.  No murmur heard. Pulmonary/Chest: Effort normal. No stridor. No respiratory distress. She has no rales.  Very minimal scattered wheezing  Abdominal: Soft. Bowel sounds are normal. She exhibits no distension. There is no tenderness. There is no guarding.  Musculoskeletal: Normal range of motion. She exhibits no edema, tenderness or deformity.  Lymphadenopathy:    She has no cervical adenopathy.  Neurological: She is alert and oriented to person, place, and time. No cranial nerve deficit.  Skin: Skin is warm and dry. Capillary refill takes less than 2 seconds. No rash noted. She is not  diaphoretic. No erythema. No pallor.  Psychiatric: She has a normal mood and affect.     ED Treatments / Results  Labs (all labs ordered are listed, but only abnormal results are displayed) Labs Reviewed  RAPID STREP SCREEN (NOT AT [facility])  CULTURE, GROUP A STREP [facility])    EKG  EKG Interpretation None       Radiology Dg Chest 2 View  Result Date: 05/14/2017 CLINICAL DATA:  Cough and shortness of breath EXAM: CHEST  2 VIEW COMPARISON:  Chest radiograph 02/09/2008 FINDINGS: The heart size and mediastinal contours are within normal limits. Both lungs are clear. The visualized skeletal structures are unremarkable. IMPRESSION: Normal chest. Electronically Signed   By: Deatra RobinsonKevin  Herman M.D.   On: 05/14/2017 14:21    Procedures Procedures (including critical care time)  Medications Ordered in ED Medications  dexamethasone (DECADRON) injection 10 mg (10 mg Intramuscular Given 05/14/17 1550)  albuterol (PROVENTIL HFA;VENTOLIN HFA) 108 (90 Base) MCG/ACT inhaler 2 puff (2 puffs Inhalation Given 05/14/17 1553)     Initial Impression / Assessment and Plan / ED Course  I have reviewed the triage vital signs and the nursing notes.  Pertinent labs & imaging results that were available during my care of the patient were reviewed by me and considered in my medical decision making (see chart for details).  Elizabeth BargeFrancis is a 36 year old female presenting with 2-3 days of upper respiratory symptoms and some mild scattered wheezing.  She had a chest x-ray that was negative and had good response to albuterol.  Also given a dose of Decadron.  No red flag signs or symptoms.  Patient most likely has upper respiratory infection which is exacerbated her underlying asthma.  Recommending supportive treatment.  Discussed return precautions.  Would not be unreasonable to take scheduled albuterol for the next 24 hours.    Final Clinical Impressions(s) / ED Diagnoses   Final diagnoses:  Mild intermittent asthma  with acute exacerbation  Flu-like symptoms    ED Discharge Orders    None       Renne MuscaWarden, Seven Maren[place] L, MD 05/14/17 1642    Blane OharaZavitz, Joshua, MD 05/22/17 1723

## 2017-05-14 NOTE — ED Notes (Signed)
Teaching done with pt on use of inhaler and spacer. Treatment of two puffs given to pt. tol well. States she understands use. No questions

## 2017-05-14 NOTE — ED Notes (Signed)
Returned from xray

## 2017-05-14 NOTE — ED Notes (Signed)
ED Provider at bedside. 

## 2017-05-14 NOTE — ED Notes (Signed)
Patient called x 3 in all areas of waiting room with no response. 

## 2017-05-14 NOTE — ED Triage Notes (Signed)
Patient complains of sore throat and fever with cough x 4 days, other family members have same, alert and oriented

## 2017-05-14 NOTE — ED Notes (Signed)
Called Radiology to locate patient, patient in there.

## 2017-05-14 NOTE — ED Notes (Signed)
Patient transported to X-ray 

## 2017-05-14 NOTE — Discharge Instructions (Signed)
If you were given medicines take as directed.  If you are on coumadin or contraceptives realize their levels and effectiveness is altered by many different medicines.  If you have any reaction (rash, tongues swelling, other) to the medicines stop taking and see a physician.   Stay well hydrated.   If your blood pressure was elevated in the ER make sure you follow up for management with a primary doctor or return for chest pain, shortness of breath or stroke symptoms.  Please follow up as directed and return to the ER or see a physician for new or worsening symptoms.  Thank you.

## 2017-05-16 LAB — CULTURE, GROUP A STREP (THRC)

## 2017-05-18 ENCOUNTER — Encounter (HOSPITAL_COMMUNITY): Payer: Self-pay | Admitting: Emergency Medicine

## 2017-05-18 ENCOUNTER — Ambulatory Visit (HOSPITAL_COMMUNITY)
Admission: EM | Admit: 2017-05-18 | Discharge: 2017-05-18 | Disposition: A | Discharge status: Home / Self Care | Payer: BC Managed Care – PPO | Attending: Family Medicine | Admitting: Family Medicine

## 2017-05-18 DIAGNOSIS — B9789 Other viral agents as the cause of diseases classified elsewhere: Secondary | ICD-10-CM

## 2017-05-18 DIAGNOSIS — J069 Acute upper respiratory infection, unspecified: Secondary | ICD-10-CM

## 2017-05-18 MED ORDER — HYDROCODONE-HOMATROPINE 5-1.5 MG/5ML PO SYRP: 5.0000 mL | ORAL_SOLUTION | Freq: Four times a day (QID) | ORAL | 0 refills | Status: AC | PRN

## 2017-05-18 MED ORDER — AZITHROMYCIN 250 MG PO TABS: ORAL_TABLET | ORAL | 0 refills | Status: DC

## 2017-05-18 NOTE — Discharge Instructions (Addendum)
You likely having a viral upper respiratory infection. We recommended symptom control. I expect your symptoms to start improving in the next 1-2 weeks.   1. Take a daily allergy pill/anti-histamine like Zyrtec, Claritin, or Store brand consistently for 2 weeks  2. For congestion you may try an oral decongestant like Mucinex or sudafed. You may also try intranasal flonase nasal spray or saline irrigations (neti pot, sinus cleanse)  3. For your sore throat you may try cepacol lozenges, salt water gargles, throat spray. Treatment of congestion may also help your sore throat.  4. For cough you may try hycodan 5 ml, honey tea  5. Take Tylenol or Ibuprofen to help with pain/inflammation  6. Stay hydrated, drink plenty of fluids to keep throat coated and less irritated  For sore throat try using a honey-based tea. Use 3 teaspoons of honey with juice squeezed from half lemon. Place shaved pieces of ginger into 1/2-1 cup of water and warm over stove top. Then mix the ingredients and repeat every 4 hours as needed.

## 2017-05-18 NOTE — ED Triage Notes (Signed)
PT C/O: cold sx... Reports son is being treated for RSV... [facility] sx   ONSET: 8-10 days   SX ALSO INCLUDE: prod cough, SOB, dyspnea, fevers, CP  TAKING MEDS: acetaminophen, albuterol, prednisone   Pt is breast feeding   A&O x4... NAD... Ambulatory

## 2017-05-18 NOTE — ED Provider Notes (Signed)
[facility]    CSN: [id number] Arrival date & time: 05/18/17  1800     History   Chief Complaint Chief Complaint  Patient presents with  . URI    HPI Elizabeth Yang is a 36 y.o. female presenting with 8-10 days of coughing, congestion, sneezing and sore throat. Her son is sick with RSV and she has also been sick. Endorses vomiting, diarrhea, and chills, right ear pain. History of asthma with albuterol inhaler. Seen in ED on Tuesday and Thursday this week, CXR obtained both times showing negative for pneumonia. Patient mainly complaining of cough and wishes to have a cough syrup. Has tried robitussen and given a shot of decadron on Thursday.   HPI  Past Medical History:  Diagnosis Date  . Anxiety   . Asthma   . Elevated blood pressure affecting pregnancy in second trimester, antepartum 09/24/2014  . Headache   . History of kidney stones   . Hypertension   . Hypertension affecting pregnancy, antepartum 09/13/2016  . Postpartum care following cesarean delivery (4/25) 09/27/2014  . Vaginal Pap smear, abnormal   . Vitamin D deficiency     Patient Active Problem List   Diagnosis Date Noted  . Hypertension affecting pregnancy, antepartum 09/13/2016  . Previous cesarean delivery, delivered 09/13/2016  . Postpartum care following cesarean delivery (4/12) 09/13/2016  . Cesarean delivery delivered 09/13/2016    Past Surgical History:  Procedure Laterality Date  . CESAREAN SECTION N/A 09/27/2014   Procedure: CESAREAN SECTION;  Surgeon: Genia Del, MD;  Location: [facility] OR[facility];  Service: Obstetrics;  Laterality: N/A;  . CESAREAN SECTION N/A 09/13/2016   Procedure: Repeat CESAREAN SECTION;  Surgeon: Shea Evans, MD;  Location: [facility] BIRTHING SUITES;  Service: Obstetrics;  Laterality: N/A;  EDD: 09/26/16   . TONSILLECTOMY    . WISDOM TOOTH EXTRACTION      OB History    Gravida Para Term Preterm AB Living   2 2 1 1  0 0   SAB TAB Ectopic Multiple Live Births   0 0 0 0  1       Home Medications    Prior to Admission medications   Medication Sig Start Date End Date Taking? Authorizing Provider  albuterol (PROVENTIL HFA;VENTOLIN HFA) 108 (90 BASE) MCG/ACT inhaler Inhale 2 puffs into the lungs every 6 (six) hours as needed for wheezing or shortness of breath.   Yes [provider]  amLODipine (NORVASC) 10 MG tablet Take 10 mg by mouth daily.   Yes [provider]  ibuprofen (ADVIL,MOTRIN) 600 MG tablet Take 1 tablet (600 mg total) by mouth every 6 (six) hours. 09/15/16  Yes Neta Mends, CNM  labetalol (NORMODYNE) 300 MG tablet Take 2 tablets (600 mg total) by mouth 3 (three) times daily. Patient taking differently: Take 300 mg by mouth 3 (three) times daily.  10/02/14  Yes Shea Evans, MD  Prenatal Vit-Fe Fumarate-FA (PRENATAL MULTIVITAMIN) TABS tablet Take 1 tablet by mouth at bedtime.    Yes [provider]  azithromycin (ZITHROMAX Z-PAK) 250 MG tablet Please take 2 tablets the first day then 1 tablet the following 4 days. 05/18/17   Melanee Cordial C, PA-C  coconut oil OIL Apply 1 application topically as needed. 09/15/16   Neta Mends, CNM  HYDROcodone-homatropine (HYCODAN) 5-1.5 MG/5ML syrup Take 5 mLs by mouth every 6 (six) hours as needed for up to 5 days for cough. 05/18/17 05/23/17  Shayleen Eppinger C, PA-C  Magnesium 200 MG TABS Take 200  mg by mouth daily.    [provider]  oxyCODONE 10 MG TABS Take 1 tablet (10 mg total) by mouth every 4 (four) hours as needed (pain scale > 7). 09/15/16   Neta MendsPaul, Daniela C, CNM    Family History Family History  Problem Relation Age of Onset  . Heart disease Mother   . Hypertension Mother   . Kidney disease Sister   . Heart disease Maternal Aunt   . Heart disease Maternal Uncle   . Cancer Maternal Grandmother   . Heart disease Maternal Grandfather   . Diabetes Paternal Grandmother     Social History Social History   Tobacco Use  . Smoking status: Never Smoker    . Smokeless tobacco: Never Used  Substance Use Topics  . Alcohol use: No  . Drug use: No     Allergies   Patient has no known allergies.   Review of Systems Review of Systems  Constitutional: Positive for chills, fatigue and fever.  HENT: Positive for congestion, ear pain and sore throat. Negative for sinus pressure, sinus pain and trouble swallowing.   Eyes: Negative for pain and visual disturbance.  Respiratory: Positive for cough, chest tightness and shortness of breath.   Cardiovascular: Negative for chest pain and palpitations.  Gastrointestinal: Positive for diarrhea and vomiting. Negative for abdominal pain and nausea.  Musculoskeletal: Negative for arthralgias and back pain.  Skin: Negative for color change and rash.  Neurological: Negative for dizziness, weakness, light-headedness and headaches.  All other systems reviewed and are negative.    Physical Exam Triage Vital Signs ED Triage Vitals  Enc Vitals Group     BP 05/18/17 1845 120/67     Pulse Rate 05/18/17 1845 79     Resp 05/18/17 1845 18     Temp 05/18/17 1845 98.8 F (37.1 C)     Temp Source 05/18/17 1845 Oral     SpO2 05/18/17 1845 99 %     Weight --      Height --      Head Circumference --      Peak Flow --      Pain Score 05/18/17 1846 3     Pain Loc --      Pain Edu? --      Excl. in GC? --    No data found.  Updated Vital Signs BP 120/67 (BP Location: Left Arm)   Pulse 79   Temp 98.8 F (37.1 C) (Oral)   Resp 18   LMP 05/18/2017   SpO2 99%   Breastfeeding? Yes   Visual Acuity Right Eye Distance:   Left Eye Distance:   Bilateral Distance:    Right Eye Near:   Left Eye Near:    Bilateral Near:     Physical Exam  Constitutional: She appears well-developed and well-nourished. No distress.  HENT:  Head: Normocephalic and atraumatic.  Right Ear: Tympanic membrane and ear canal normal.  Left Ear: Tympanic membrane and ear canal normal.  Nose: Rhinorrhea present. Right sinus  exhibits no maxillary sinus tenderness and no frontal sinus tenderness. Left sinus exhibits no maxillary sinus tenderness and no frontal sinus tenderness.  Mouth/Throat: Uvula is midline and mucous membranes are normal. No oral lesions. No trismus in the jaw. No uvula swelling. Posterior oropharyngeal erythema present. No oropharyngeal exudate or posterior oropharyngeal edema. No tonsillar exudate.  Eyes: Conjunctivae are normal.  Neck: Neck supple.  Cardiovascular: Normal rate and regular rhythm.  No murmur heard. Pulmonary/Chest: Effort normal and  breath sounds normal. No respiratory distress. She has no wheezes. She has no rales.  Occasional cough  Abdominal: Soft. She exhibits no distension. There is no tenderness. There is no guarding.  Musculoskeletal: She exhibits no edema.  Neurological: She is alert.  Skin: Skin is warm and dry.  Psychiatric: She has a normal mood and affect.  Nursing note and vitals reviewed.    UC Treatments / Results  Labs (all labs ordered are listed, but only abnormal results are displayed) Labs Reviewed - No data to display  EKG  EKG Interpretation None       Radiology No results found.  Procedures Procedures (including critical care time)  Medications Ordered in UC Medications - No data to display   Initial Impression / Assessment and Plan / UC Course  I have reviewed the triage vital signs and the nursing notes.  Pertinent labs & imaging results that were available during my care of the patient were reviewed by me and considered in my medical decision making (see chart for details).     Patient presents with symptoms likely from a viral upper respiratory infection. Differential includes bacterial pneumonia, sinusitis, allergic rhinitis, acute bronchitis. Do not suspect underlying cardiopulmonary process. Symptoms seem unlikely related to ACS, CHF or COPD exacerbations, pneumonia, pneumothorax. Patient is nontoxic appearing and not in  need of emergent medical intervention.  Given length of symptoms with empirically treat with azithromycin, symptoms seem to be more chest related and sinusitis. Hycodan given for cough- advised to not breast feed while taking and for 2-3 days afterward. Can use at night for sleep and robitussin during the day. Advised to use albuterol every 6 hours then go to as needed as symptoms controlled and SOB and chest tightness relieved.  Recommended symptom control with over the counter medications: Daily oral anti-histamine, Oral decongestant or IN corticosteroid, saline irrigations, cepacol lozenges, Robitussin, Delsym, honey tea.  Return if symptoms fail to improve in 1-2 weeks or you develop shortness of breath, chest pain, severe headache. Patient states understanding and is agreeable.    Final Clinical Impressions(s) / UC Diagnoses   Final diagnoses:  Viral URI with cough    ED Discharge Orders        Ordered    HYDROcodone-homatropine (HYCODAN) 5-1.5 MG/5ML syrup  Every 6 hours PRN     05/18/17 1937    azithromycin (ZITHROMAX Z-PAK) 250 MG tablet     05/18/17 1941       Controlled Substance Prescriptions [place] Controlled Substance Registry consulted? Yes, I have consulted the [place] Controlled Substances Registry for this patient, and feel the risk/benefit ratio today is favorable for proceeding with this prescription for a controlled substance. Last prescription in April of this year, not currently on any opioids, does not appear to abuse.    Lew DawesWieters, Jahnai Slingerland C, [place]PA-C 05/18/17 2151

## 2017-09-27 LAB — OB RESULTS CONSOLE RUBELLA ANTIBODY, IGM: Rubella: IMMUNE

## 2017-09-27 LAB — OB RESULTS CONSOLE RPR: RPR: NONREACTIVE

## 2017-09-27 LAB — OB RESULTS CONSOLE GC/CHLAMYDIA
Chlamydia: NEGATIVE
Gonorrhea: NEGATIVE

## 2017-09-27 LAB — OB RESULTS CONSOLE HIV ANTIBODY (ROUTINE TESTING): HIV: NONREACTIVE

## 2017-09-27 LAB — OB RESULTS CONSOLE ANTIBODY SCREEN: Antibody Screen: NEGATIVE

## 2017-09-27 LAB — OB RESULTS CONSOLE ABO/RH: RH TYPE: POSITIVE

## 2017-09-27 LAB — OB RESULTS CONSOLE HEPATITIS B SURFACE ANTIGEN: HEP B S AG: NEGATIVE

## 2018-03-19 ENCOUNTER — Inpatient Hospital Stay (HOSPITAL_BASED_OUTPATIENT_CLINIC_OR_DEPARTMENT_OTHER): Payer: BC Managed Care – PPO

## 2018-03-19 ENCOUNTER — Encounter (HOSPITAL_COMMUNITY): Payer: Self-pay

## 2018-03-19 ENCOUNTER — Inpatient Hospital Stay (HOSPITAL_COMMUNITY)
Admission: AD | Admit: 2018-03-19 | Discharge: 2018-03-19 | Disposition: A | Discharge status: Home / Self Care | Payer: BC Managed Care – PPO | Source: Ambulatory Visit | Attending: Obstetrics & Gynecology | Admitting: Obstetrics & Gynecology

## 2018-03-19 DIAGNOSIS — O4692 Antepartum hemorrhage, unspecified, second trimester: Secondary | ICD-10-CM

## 2018-03-19 DIAGNOSIS — O4693 Antepartum hemorrhage, unspecified, third trimester: Secondary | ICD-10-CM

## 2018-03-19 DIAGNOSIS — Z3A35 35 weeks gestation of pregnancy: Secondary | ICD-10-CM

## 2018-03-19 NOTE — MAU Note (Signed)
PT  SAYS  SHE WOKE  AT 1230 AM  WITH  DARK BLOOD  IN BED.    PREVIA  EARLIER HAD  RESOLVED.   NO VE IN OFFICE .  BABY MOVING .   DENIES  ANY PAIN NOW

## 2018-03-19 NOTE — MAU Note (Signed)
Pt states she woke up around 1230am with dark red blood in bed. Denies pain. Reports good fetal movement. Denies recent intercourse or vaginal exams. States she slipped and did a split- did not hit abdomen or back

## 2018-03-19 NOTE — Discharge Instructions (Signed)
Fetal Movement Counts Patient Name: ________________________________________________ Patient Due Date: ____________________ What is a fetal movement count? A fetal movement count is the number of times that you feel your baby move during a certain amount of time. This may also be called a fetal kick count. A fetal movement count is recommended for every pregnant woman. You may be asked to start counting fetal movements as early as week 28 of your pregnancy. Pay attention to when your baby is most active. You may notice your baby's sleep and wake cycles. You may also notice things that make your baby move more. You should do a fetal movement count:  When your baby is normally most active.  At the same time each day.  A good time to count movements is while you are resting, after having something to eat and drink. How do I count fetal movements? 1. Find a quiet, comfortable area. Sit, or lie down on your side. 2. Write down the date, the start time and stop time, and the number of movements that you felt between those two times. Take this information with you to your health care visits. 3. For 2 hours, count kicks, flutters, swishes, rolls, and jabs. You should feel at least 10 movements during 2 hours. 4. You may stop counting after you have felt 10 movements. 5. If you do not feel 10 movements in 2 hours, have something to eat and drink. Then, keep resting and counting for 1 hour. If you feel at least 4 movements during that hour, you may stop counting. Contact a health care provider if:  You feel fewer than 4 movements in 2 hours.  Your baby is not moving like he or she usually does. Date: ____________ Start time: ____________ Stop time: ____________ Movements: ____________ Date: ____________ Start time: ____________ Stop time: ____________ Movements: ____________ Date: ____________ Start time: ____________ Stop time: ____________ Movements: ____________ Date: ____________ Start time:  ____________ Stop time: ____________ Movements: ____________ Date: ____________ Start time: ____________ Stop time: ____________ Movements: ____________ Date: ____________ Start time: ____________ Stop time: ____________ Movements: ____________ Date: ____________ Start time: ____________ Stop time: ____________ Movements: ____________ Date: ____________ Start time: ____________ Stop time: ____________ Movements: ____________ Date: ____________ Start time: ____________ Stop time: ____________ Movements: ____________ This information is not intended to replace advice given to you by your health care provider. Make sure you discuss any questions you have with your health care provider. Document Released: 06/20/2006 Document Revised: 01/18/2016 Document Reviewed: 06/30/2015 Elsevier Interactive Patient Education  2018 [employer]. Vaginal Bleeding During Pregnancy, Third Trimester A small amount of bleeding (spotting) from the vagina is relatively common in pregnancy. Various things can cause bleeding or spotting in pregnancy. Sometimes the bleeding is normal and is not a problem. However, bleeding during the third trimester can also be a sign of something serious for the mother and the baby. Be sure to tell your health care provider about any vaginal bleeding right away. Some possible causes of vaginal bleeding during the third trimester include:  The placenta may be partially or completely covering the opening to the cervix (placenta previa).  The placenta may have separated from the uterus (abruption of the placenta).  There may be an infection or growth on the cervix.  You may be starting labor, called discharging of the mucus plug.  The placenta may grow into the muscle layer of the uterus (placenta accreta).  Follow these instructions at home: Watch your condition for any changes. The following actions may help to lessen  any discomfort you are feeling:  Follow your health care  provider's instructions for limiting your activity. If your health care provider orders bed rest, you may need to stay in bed and only get up to use the bathroom. However, your health care provider may allow you to continue light activity.  If needed, make plans for someone to help with your regular activities and responsibilities while you are on bed rest.  Keep track of the number of pads you use each day, how often you change pads, and how soaked (saturated) they are. Write this down.  Do not use tampons. Do not douche.  Do not have sexual intercourse or orgasms until approved by your health care provider.  Follow your health care provider's advice about lifting, driving, and physical activities.  If you pass any tissue from your vagina, save the tissue so you can show it to your health care provider.  Only take over-the-counter or prescription medicines as directed by your health care provider.  Do not take aspirin because it can make you bleed.  Keep all follow-up appointments as directed by your health care provider.  Contact a health care provider if:  You have any vaginal bleeding during any part of your pregnancy.  You have cramps or labor pains.  You have a fever, not controlled by medicine. Get help right away if:  You have severe cramps or pain in your back or belly (abdomen).  You have chills.  You have a gush of fluid from the vagina.  You pass large clots or tissue from your vagina.  Your bleeding increases.  You feel light-headed or weak.  You pass out.  You feel less movement or no movement of the baby. This information is not intended to replace advice given to you by your health care provider. Make sure you discuss any questions you have with your health care provider. Document Released: 08/11/2002 Document Revised: 10/27/2015 Document Reviewed: 01/26/2013 Elsevier Interactive Patient Education  [employer].

## 2018-03-19 NOTE — MAU Provider Note (Signed)
Chief Complaint:  Vaginal Bleeding   First Provider Initiated Contact with Patient 03/19/18 0121     HPI: Elizabeth Yang is a 37 y.o. G3P1101 at [redacted]w[redacted]d who presents to maternity admissions reporting vaginal bleeding. Woke up this morning with blood staining her sheets. Went to the bathroom and had dark red blood on toilet paper. No recent intercourse or exam. Had placenta previa at beginning of pregnancy that resolved by 19 wk ultrasound. Denies abdominal pain or LOF. Positive fetal movement. Next appt in office is on Friday.  States she has had vaginal irritation for a majority of the pregnancy. Was told that she only had a small amount of yeast so stopped mentioning her symptoms. Denies vaginal discharge.     Past Medical History:  Diagnosis Date  . Anxiety   . Asthma   . Headache   . History of kidney stones   . Hypertension   . Hypertension affecting pregnancy, antepartum 09/13/2016  . Vaginal Pap smear, abnormal   . Vitamin D deficiency    OB History  Gravida Para Term Preterm AB Living  3 2 1 1  0 1  SAB TAB Ectopic Multiple Live Births  0 0 0 0 1    # Outcome Date GA Lbr Len/2nd Weight Sex Delivery Anes PTL Lv  3 Current           2 Term 09/13/16 [redacted]w[redacted]d  2930 g M CS-LTranv Spinal  LIV  1 Preterm 09/27/14 [redacted]w[redacted]d  640 g M CS-LTranv Spinal  ND   Past Surgical History:  Procedure Laterality Date  . CESAREAN SECTION N/A 09/27/2014   Procedure: CESAREAN SECTION;  Surgeon: Genia Del, MD;  Location: [facility];  Service: Obstetrics;  Laterality: N/A;  . CESAREAN SECTION N/A 09/13/2016   Procedure: Repeat CESAREAN SECTION;  Surgeon: Shea Evans, MD;  Location: [facility] BIRTHING SUIT[facility];  Service: Obstetrics;  Laterality: N/A;  EDD: 09/26/16   . TONSILLECTOMY    . WISDOM TOOTH EXTRACTION     Family History  Problem Relation Age of Onset  . Heart disease Mother   . Hypertension Mother   . Kidney disease Sister   . Heart disease Maternal Aunt   . Heart disease Maternal Uncle   .  Cancer Maternal Grandmother   . Heart disease Maternal Grandfather   . Diabetes Paternal Grandmother    Social History   Tobacco Use  . Smoking status: Never Smoker  . Smokeless tobacco: Never Used  Substance Use Topics  . Alcohol use: No  . Drug use: No   No Known Allergies No medications prior to admission.    I have reviewed patient's Past Medical Hx, Surgical Hx, Family Hx, Social Hx, medications and allergies.   ROS:  Review of Systems  Constitutional: Negative.   Gastrointestinal: Negative for abdominal pain.  Genitourinary: Positive for vaginal bleeding.    Physical Exam   Patient Vitals for the past 24 hrs:  BP Temp Temp src Pulse Resp Height Weight  03/19/18 0258 116/73 - - 90 18 - -  03/19/18 0054 123/80 98.4 F (36.9 C) Oral 90 20 5\' 2"  (1.575 m) 91.7 kg    Constitutional: Well-developed, well-nourished female in no acute distress.  Cardiovascular: normal rate & rhythm, no murmur Respiratory: normal effort, lung sounds clear throughout GI: Abd soft, non-tender, gravid appropriate for gestational age. Pos BS x 4 MS: Extremities nontender, no edema, normal ROM Neurologic: Alert and oriented x 4.  GU:      Pelvic: blood staining noted  on vulva & groin. NEFG. Small abrasion on left vaginal wall near introitus; not actively bleeding. No blood in vagina. Cervix pink & smooth. Moderate amount of thick white discharge.   Dilation: Closed Effacement (%): Thick Cervical Position: Posterior Exam by:: Judeth Horn NP  NST:  Baseline: 135 bpm, Variability: Good {> 6 bpm), Accelerations: Reactive and Decelerations: Absent   Labs: No results found for this or any previous visit (from the past 24 hour(s)).  Imaging:  No results found.  MAU Course: Orders Placed This Encounter  Procedures  . [place] MFM OB LIMITED  . Discharge patient   No orders of the defined types were placed in this encounter.   MDM: Reactive NST. Irregular ctx & UI on monitor. Abdomen soft  & non tender. No blood on exam. Cervix closed/thick. Limited OB ultrasound shows fundal placenta, normal AFI, no evidence of abruption RH positive C/w Dr. Juliene Pina. Notified of patient's presenting complaint, FHT, exam, and ultrasound. Ok to discharge home.   Assessment: 1. Vaginal bleeding in pregnancy, second trimester   2. [redacted] weeks gestation of pregnancy     Plan: Discharge home in stable condition.  Preterm Labor precautions and fetal kick counts Strict return precautions related to bleeding, abdominal pain, LOF, and/or DFM  Follow-up Information    Shea Evans, MD Follow up.   Specialty:  Obstetrics and Gynecology Contact information: [address] [place] [address] [phone]           Allergies as of 03/19/2018   No Known Allergies     Medication List    STOP taking these medications   azithromycin 250 MG tablet Commonly known as:  ZITHROMAX   ibuprofen 600 MG tablet Commonly known as:  ADVIL,MOTRIN   Oxycodone HCl 10 MG Tabs     TAKE these medications   albuterol 108 (90 Base) MCG/ACT inhaler Commonly known as:  PROVENTIL HFA;VENTOLIN HFA Inhale 2 puffs into the lungs every 6 (six) hours as needed for wheezing or shortness of breath.   amLODipine 10 MG tablet Commonly known as:  NORVASC Take 10 mg by mouth daily.   aspirin EC 81 MG tablet Take 81 mg by mouth daily.   coconut oil Oil Apply 1 application topically as needed.   labetalol 300 MG tablet Commonly known as:  NORMODYNE Take 2 tablets (600 mg total) by mouth 3 (three) times daily. What changed:  how much to take   Magnesium 200 MG Tabs Take 200 mg by mouth daily.   prenatal multivitamin Tabs tablet Take 1 tablet by mouth at bedtime.       Judeth Horn, NP 03/19/2018 3:23 AM

## 2018-03-21 LAB — OB RESULTS CONSOLE GBS: GBS: POSITIVE

## 2018-03-28 ENCOUNTER — Other Ambulatory Visit: Payer: Self-pay | Admitting: Obstetrics & Gynecology

## 2018-04-01 ENCOUNTER — Telehealth (HOSPITAL_COMMUNITY): Payer: Self-pay | Admitting: *Deleted

## 2018-04-01 NOTE — Telephone Encounter (Signed)
Preadmission screen  

## 2018-04-03 ENCOUNTER — Telehealth (HOSPITAL_COMMUNITY): Payer: Self-pay | Admitting: *Deleted

## 2018-04-03 NOTE — Telephone Encounter (Signed)
Preadmission screen  

## 2018-04-04 ENCOUNTER — Encounter (HOSPITAL_COMMUNITY): Payer: Self-pay

## 2018-04-14 ENCOUNTER — Other Ambulatory Visit (HOSPITAL_COMMUNITY): Payer: Self-pay | Admitting: Obstetrics and Gynecology

## 2018-04-14 ENCOUNTER — Ambulatory Visit (HOSPITAL_COMMUNITY)
Admission: RE | Admit: 2018-04-14 | Discharge: 2018-04-14 | Disposition: A | Discharge status: Home / Self Care | Payer: BC Managed Care – PPO | Source: Ambulatory Visit | Attending: Obstetrics and Gynecology | Admitting: Obstetrics and Gynecology

## 2018-04-14 ENCOUNTER — Encounter (HOSPITAL_COMMUNITY): Payer: Self-pay

## 2018-04-14 ENCOUNTER — Encounter (HOSPITAL_COMMUNITY)
Admission: RE | Admit: 2018-04-14 | Discharge: 2018-04-14 | Disposition: A | Discharge status: Home / Self Care | Payer: BC Managed Care – PPO | Source: Ambulatory Visit | Attending: Obstetrics & Gynecology | Admitting: Obstetrics & Gynecology

## 2018-04-14 DIAGNOSIS — I10 Essential (primary) hypertension: Secondary | ICD-10-CM

## 2018-04-14 DIAGNOSIS — O09213 Supervision of pregnancy with history of pre-term labor, third trimester: Secondary | ICD-10-CM

## 2018-04-14 DIAGNOSIS — O10013 Pre-existing essential hypertension complicating pregnancy, third trimester: Secondary | ICD-10-CM | POA: Diagnosis not present

## 2018-04-14 DIAGNOSIS — Z3A38 38 weeks gestation of pregnancy: Secondary | ICD-10-CM

## 2018-04-14 DIAGNOSIS — Z363 Encounter for antenatal screening for malformations: Secondary | ICD-10-CM | POA: Diagnosis not present

## 2018-04-14 DIAGNOSIS — O283 Abnormal ultrasonic finding on antenatal screening of mother: Secondary | ICD-10-CM

## 2018-04-14 DIAGNOSIS — O169 Unspecified maternal hypertension, unspecified trimester: Secondary | ICD-10-CM

## 2018-04-14 LAB — COMPREHENSIVE METABOLIC PANEL
ALBUMIN: 2.8 g/dL — AB (ref 3.5–5.0)
ALT: 18 U/L (ref 0–44)
AST: 14 U/L — AB (ref 15–41)
Alkaline Phosphatase: 135 U/L — ABNORMAL HIGH (ref 38–126)
Anion gap: 7 (ref 5–15)
BUN: 16 mg/dL (ref 6–20)
CO2: 19 mmol/L — AB (ref 22–32)
Calcium: 8.4 mg/dL — ABNORMAL LOW (ref 8.9–10.3)
Chloride: 107 mmol/L (ref 98–111)
Creatinine, Ser: 0.74 mg/dL (ref 0.44–1.00)
GFR calc Af Amer: >60 mL/min (ref >60)
GFR calc non Af Amer: >60 mL/min (ref >60)
GLUCOSE: 89 mg/dL (ref 70–99)
POTASSIUM: 4.2 mmol/L (ref 3.5–5.1)
Sodium: 133 mmol/L — ABNORMAL LOW (ref 135–145)
Total Bilirubin: 0.4 mg/dL (ref 0.3–1.2)
Total Protein: 6.4 g/dL — ABNORMAL LOW (ref 6.5–8.1)

## 2018-04-14 LAB — TYPE AND SCREEN
ABO/RH(D): A POS
Antibody Screen: NEGATIVE

## 2018-04-14 LAB — CBC
HEMATOCRIT: 37.3 % (ref 36.0–46.0)
Hemoglobin: 12.4 g/dL (ref 12.0–15.0)
MCH: 29.6 pg (ref 26.0–34.0)
MCHC: 33.2 g/dL (ref 30.0–36.0)
MCV: 89 fL (ref 80.0–100.0)
NRBC: 0 % (ref 0.0–0.2)
PLATELETS: 166 10*3/uL (ref 150–400)
RBC: 4.19 MIL/uL (ref 3.87–5.11)
RDW: 14.1 % (ref 11.5–15.5)
WBC: 9 10*3/uL (ref 4.0–10.5)

## 2018-04-14 NOTE — Patient Instructions (Signed)
Elizabeth Yang  04/14/2018   Your procedure is scheduled on:  04/06/2018  Enter through the Main Entrance of [facility] at 0630 AM.  Pick up the phone at the desk and dial [phone]  Call this number if you have problems the morning of surgery:[phone]  Remember:   Do not eat food:(After Midnight) Desps de medianoche.  Do not drink clear liquids: (After Midnight) Desps de medianoche.  Take these medicines the morning of surgery with A SIP OF WATER: bring your inhaler.  Take labetalol as prescribed   Do not wear jewelry, make-up or nail polish.  Do not wear lotions, powders, or perfumes. Do not wear deodorant.  Do not shave 48 hours prior to surgery.  Do not bring valuables to the hospital.  [facility] is not   responsible for any belongings or valuables brought to the hospital.  Contacts, dentures or bridgework may not be worn into surgery.  Leave suitcase in the car. After surgery it may be brought to your room.  For patients admitted to the hospital, checkout time is 11:00 AM the day of              discharge.    N/A   Please read over the following fact sheets that you were given:   Surgical Site Infection Prevention

## 2018-04-14 NOTE — Procedures (Signed)
Elizabeth Yang 1981/05/18 [redacted]w[redacted]d  Fetus A Non-Stress Test Interpretation for 04/14/18  Indication: Unsatisfactory BPP  Fetal Heart Rate A Mode: External Baseline Rate (A): 125 bpm Variability: Moderate Accelerations: 15 x 15 Decelerations: None Multiple birth?: No  Uterine Activity Mode: Palpation, Toco Contraction Frequency (min): Rare Contraction Quality: Mild Resting Tone Palpated: Relaxed Resting Time: Adequate  Interpretation (Fetal Testing) Nonstress Test Interpretation: Reactive Overall Impression: Reassuring for gestational age Comments: EFM tracing reviewed by Dr. Judeth Cornfield

## 2018-04-15 ENCOUNTER — Encounter (HOSPITAL_COMMUNITY)
Admission: RE | Admit: 2018-04-15 | Discharge: 2018-04-15 | Disposition: A | Discharge status: Home / Self Care | Payer: BC Managed Care – PPO | Source: Ambulatory Visit | Attending: Obstetrics & Gynecology | Admitting: Obstetrics & Gynecology

## 2018-04-15 LAB — RPR: RPR Ser Ql: NONREACTIVE

## 2018-04-16 ENCOUNTER — Encounter (HOSPITAL_COMMUNITY)
Admission: RE | Disposition: A | Discharge status: Home / Self Care | Payer: Self-pay | Source: Home / Self Care | Attending: Obstetrics & Gynecology

## 2018-04-16 ENCOUNTER — Encounter (HOSPITAL_COMMUNITY): Payer: Self-pay | Admitting: Anesthesiology

## 2018-04-16 ENCOUNTER — Inpatient Hospital Stay (HOSPITAL_COMMUNITY): Payer: BC Managed Care – PPO | Admitting: Anesthesiology

## 2018-04-16 ENCOUNTER — Inpatient Hospital Stay (HOSPITAL_COMMUNITY)
Admission: RE | Admit: 2018-04-16 | Discharge: 2018-04-18 | DRG: 787 | Disposition: A | Discharge status: Home / Self Care | Payer: BC Managed Care – PPO | Attending: Obstetrics & Gynecology | Admitting: Obstetrics & Gynecology

## 2018-04-16 DIAGNOSIS — Z3A39 39 weeks gestation of pregnancy: Secondary | ICD-10-CM

## 2018-04-16 DIAGNOSIS — O10919 Unspecified pre-existing hypertension complicating pregnancy, unspecified trimester: Secondary | ICD-10-CM | POA: Diagnosis present

## 2018-04-16 DIAGNOSIS — O1002 Pre-existing essential hypertension complicating childbirth: Secondary | ICD-10-CM | POA: Diagnosis present

## 2018-04-16 DIAGNOSIS — O99824 Streptococcus B carrier state complicating childbirth: Secondary | ICD-10-CM | POA: Diagnosis present

## 2018-04-16 DIAGNOSIS — O34211 Maternal care for low transverse scar from previous cesarean delivery: Principal | ICD-10-CM | POA: Diagnosis present

## 2018-04-16 DIAGNOSIS — Z98891 History of uterine scar from previous surgery: Secondary | ICD-10-CM

## 2018-04-16 DIAGNOSIS — O169 Unspecified maternal hypertension, unspecified trimester: Secondary | ICD-10-CM | POA: Diagnosis present

## 2018-04-16 SURGERY — Surgical Case
Anesthesia: Spinal

## 2018-04-16 MED ORDER — NALBUPHINE HCL 10 MG/ML IJ SOLN: 5.0000 mg | Freq: Once | INTRAMUSCULAR | Status: DC | PRN

## 2018-04-16 MED ORDER — DIPHENHYDRAMINE HCL 25 MG PO CAPS
25.0000 mg | ORAL_CAPSULE | ORAL | Status: DC | PRN
  Filled 2018-04-16: qty 1

## 2018-04-16 MED ORDER — PRENATAL MULTIVITAMIN CH
1.0000 | ORAL_TABLET | Freq: Every day | ORAL | Status: DC
  Administered 2018-04-17 – 2018-04-18 (×2): 1 via ORAL
  Filled 2018-04-16 (×2): qty 1

## 2018-04-16 MED ORDER — MISOPROSTOL 200 MCG PO TABS
800.0000 ug | ORAL_TABLET | Freq: Once | ORAL | Status: AC
  Administered 2018-04-16: 800 ug via RECTAL

## 2018-04-16 MED ORDER — IBUPROFEN 600 MG PO TABS
600.0000 mg | ORAL_TABLET | Freq: Four times a day (QID) | ORAL | Status: DC
  Administered 2018-04-16 – 2018-04-18 (×8): 600 mg via ORAL
  Filled 2018-04-16 (×8): qty 1

## 2018-04-16 MED ORDER — ONDANSETRON HCL 4 MG/2ML IJ SOLN
INTRAMUSCULAR | Status: AC
  Filled 2018-04-16: qty 2

## 2018-04-16 MED ORDER — OXYCODONE HCL 5 MG PO TABS: 10.0000 mg | ORAL_TABLET | ORAL | Status: DC | PRN

## 2018-04-16 MED ORDER — SODIUM CHLORIDE 0.9 % IR SOLN: Status: DC | PRN

## 2018-04-16 MED ORDER — AMLODIPINE BESYLATE 10 MG PO TABS
10.0000 mg | ORAL_TABLET | Freq: Every day | ORAL | Status: DC
  Administered 2018-04-17 – 2018-04-18 (×2): 10 mg via ORAL
  Filled 2018-04-16 (×4): qty 1

## 2018-04-16 MED ORDER — OXYTOCIN 40 UNITS IN LACTATED RINGERS INFUSION - SIMPLE MED: 2.5000 [IU]/h | INTRAVENOUS | Status: AC

## 2018-04-16 MED ORDER — TETANUS-DIPHTH-ACELL PERTUSSIS 5-2.5-18.5 LF-MCG/0.5 IM SUSP: 0.5000 mL | Freq: Once | INTRAMUSCULAR | Status: DC

## 2018-04-16 MED ORDER — FENTANYL CITRATE (PF) 100 MCG/2ML IJ SOLN
INTRAMUSCULAR | Status: AC
  Filled 2018-04-16: qty 2

## 2018-04-16 MED ORDER — PHENYLEPHRINE 8 MG IN D5W 100 ML (0.08MG/ML) PREMIX OPTIME
INJECTION | INTRAVENOUS | Status: AC
  Filled 2018-04-16: qty 100

## 2018-04-16 MED ORDER — MISOPROSTOL 200 MCG PO TABS
ORAL_TABLET | ORAL | Status: AC
  Filled 2018-04-16: qty 4

## 2018-04-16 MED ORDER — ASPIRIN EC 81 MG PO TBEC
81.0000 mg | DELAYED_RELEASE_TABLET | Freq: Every day | ORAL | Status: DC
  Administered 2018-04-17 – 2018-04-18 (×2): 81 mg via ORAL
  Filled 2018-04-16 (×4): qty 1

## 2018-04-16 MED ORDER — DIBUCAINE 1 % RE OINT: 1.0000 "application " | TOPICAL_OINTMENT | RECTAL | Status: DC | PRN

## 2018-04-16 MED ORDER — OXYCODONE HCL 5 MG PO TABS
5.0000 mg | ORAL_TABLET | ORAL | Status: DC | PRN
  Administered 2018-04-17 – 2018-04-18 (×2): 5 mg via ORAL
  Filled 2018-04-16 (×2): qty 1

## 2018-04-16 MED ORDER — LACTATED RINGERS IV SOLN
INTRAVENOUS | Status: DC
  Administered 2018-04-16 (×2): via INTRAVENOUS

## 2018-04-16 MED ORDER — LACTATED RINGERS IV SOLN
INTRAVENOUS | Status: DC | PRN
  Administered 2018-04-16: 09:00:00 via INTRAVENOUS

## 2018-04-16 MED ORDER — DIPHENHYDRAMINE HCL 25 MG PO CAPS: 25.0000 mg | ORAL_CAPSULE | Freq: Four times a day (QID) | ORAL | Status: DC | PRN

## 2018-04-16 MED ORDER — LACTATED RINGERS IV SOLN
INTRAVENOUS | Status: DC
  Administered 2018-04-16: 16:00:00 via INTRAVENOUS

## 2018-04-16 MED ORDER — SIMETHICONE 80 MG PO CHEW: 80.0000 mg | CHEWABLE_TABLET | ORAL | Status: DC | PRN

## 2018-04-16 MED ORDER — NALOXONE HCL 4 MG/10ML IJ SOLN
1.0000 ug/kg/h | INTRAVENOUS | Status: DC | PRN
  Filled 2018-04-16: qty 5

## 2018-04-16 MED ORDER — SCOPOLAMINE 1 MG/3DAYS TD PT72
MEDICATED_PATCH | TRANSDERMAL | Status: AC
  Filled 2018-04-16: qty 1

## 2018-04-16 MED ORDER — SENNOSIDES-DOCUSATE SODIUM 8.6-50 MG PO TABS
2.0000 | ORAL_TABLET | ORAL | Status: DC
  Administered 2018-04-17 – 2018-04-18 (×2): 2 via ORAL
  Filled 2018-04-16 (×2): qty 2

## 2018-04-16 MED ORDER — ALBUTEROL SULFATE (2.5 MG/3ML) 0.083% IN NEBU: 3.0000 mL | INHALATION_SOLUTION | Freq: Four times a day (QID) | RESPIRATORY_TRACT | Status: DC | PRN

## 2018-04-16 MED ORDER — PHENYLEPHRINE HCL 10 MG/ML IJ SOLN
INTRAMUSCULAR | Status: DC | PRN
  Administered 2018-04-16: 80 ug via INTRAVENOUS
  Administered 2018-04-16: 40 ug via INTRAVENOUS
  Administered 2018-04-16: 80 ug via INTRAVENOUS

## 2018-04-16 MED ORDER — COCONUT OIL OIL: 1.0000 "application " | TOPICAL_OIL | Status: DC | PRN

## 2018-04-16 MED ORDER — CEFAZOLIN SODIUM-DEXTROSE 2-4 GM/100ML-% IV SOLN
2.0000 g | INTRAVENOUS | Status: AC
  Administered 2018-04-16: 2 g via INTRAVENOUS

## 2018-04-16 MED ORDER — DEXAMETHASONE SODIUM PHOSPHATE 10 MG/ML IJ SOLN
INTRAMUSCULAR | Status: AC
  Filled 2018-04-16: qty 1

## 2018-04-16 MED ORDER — PHENYLEPHRINE 8 MG IN D5W 100 ML (0.08MG/ML) PREMIX OPTIME
INJECTION | INTRAVENOUS | Status: DC | PRN
  Administered 2018-04-16: 70 ug/min via INTRAVENOUS

## 2018-04-16 MED ORDER — MORPHINE SULFATE (PF) 0.5 MG/ML IJ SOLN
INTRAMUSCULAR | Status: AC
  Filled 2018-04-16: qty 10

## 2018-04-16 MED ORDER — LABETALOL HCL 100 MG PO TABS
150.0000 mg | ORAL_TABLET | Freq: Every day | ORAL | Status: DC
  Administered 2018-04-17 – 2018-04-18 (×2): 150 mg via ORAL
  Filled 2018-04-16 (×2): qty 2

## 2018-04-16 MED ORDER — ONDANSETRON HCL 4 MG/2ML IJ SOLN
INTRAMUSCULAR | Status: DC | PRN
  Administered 2018-04-16: 4 mg via INTRAVENOUS

## 2018-04-16 MED ORDER — MENTHOL 3 MG MT LOZG: 1.0000 | LOZENGE | OROMUCOSAL | Status: DC | PRN

## 2018-04-16 MED ORDER — SCOPOLAMINE 1 MG/3DAYS TD PT72
1.0000 | MEDICATED_PATCH | Freq: Once | TRANSDERMAL | Status: DC
  Administered 2018-04-16: 1.5 mg via TRANSDERMAL

## 2018-04-16 MED ORDER — WITCH HAZEL-GLYCERIN EX PADS: 1.0000 "application " | MEDICATED_PAD | CUTANEOUS | Status: DC | PRN

## 2018-04-16 MED ORDER — MAGNESIUM 200 MG PO TABS
200.0000 mg | ORAL_TABLET | Freq: Every day | ORAL | Status: DC
  Administered 2018-04-17 – 2018-04-18 (×2): 200 mg via ORAL
  Filled 2018-04-16 (×4): qty 1

## 2018-04-16 MED ORDER — NALOXONE HCL 0.4 MG/ML IJ SOLN: 0.4000 mg | INTRAMUSCULAR | Status: DC | PRN

## 2018-04-16 MED ORDER — NALBUPHINE HCL 10 MG/ML IJ SOLN: 5.0000 mg | INTRAMUSCULAR | Status: DC | PRN

## 2018-04-16 MED ORDER — FENTANYL CITRATE (PF) 100 MCG/2ML IJ SOLN
INTRAMUSCULAR | Status: DC | PRN
  Administered 2018-04-16: 15 ug via INTRATHECAL

## 2018-04-16 MED ORDER — OXYTOCIN 10 UNIT/ML IJ SOLN
INTRAVENOUS | Status: DC | PRN
  Administered 2018-04-16: 40 [IU] via INTRAVENOUS

## 2018-04-16 MED ORDER — ZOLPIDEM TARTRATE 5 MG PO TABS: 5.0000 mg | ORAL_TABLET | Freq: Every evening | ORAL | Status: DC | PRN

## 2018-04-16 MED ORDER — SODIUM CHLORIDE 0.9% FLUSH: 3.0000 mL | INTRAVENOUS | Status: DC | PRN

## 2018-04-16 MED ORDER — SIMETHICONE 80 MG PO CHEW
80.0000 mg | CHEWABLE_TABLET | Freq: Three times a day (TID) | ORAL | Status: DC
  Administered 2018-04-16 – 2018-04-18 (×6): 80 mg via ORAL
  Filled 2018-04-16 (×6): qty 1

## 2018-04-16 MED ORDER — MEPERIDINE HCL 25 MG/ML IJ SOLN: 6.2500 mg | INTRAMUSCULAR | Status: DC | PRN

## 2018-04-16 MED ORDER — OXYTOCIN 10 UNIT/ML IJ SOLN
INTRAMUSCULAR | Status: AC
  Filled 2018-04-16: qty 4

## 2018-04-16 MED ORDER — SIMETHICONE 80 MG PO CHEW
80.0000 mg | CHEWABLE_TABLET | ORAL | Status: DC
  Administered 2018-04-17 – 2018-04-18 (×2): 80 mg via ORAL
  Filled 2018-04-16 (×2): qty 1

## 2018-04-16 MED ORDER — BUPIVACAINE IN DEXTROSE 0.75-8.25 % IT SOLN
INTRATHECAL | Status: DC | PRN
  Administered 2018-04-16: 1.6 mL via INTRATHECAL

## 2018-04-16 MED ORDER — DIPHENHYDRAMINE HCL 50 MG/ML IJ SOLN: 12.5000 mg | INTRAMUSCULAR | Status: DC | PRN

## 2018-04-16 MED ORDER — MORPHINE SULFATE (PF) 0.5 MG/ML IJ SOLN
INTRAMUSCULAR | Status: DC | PRN
  Administered 2018-04-16: .15 mg via INTRATHECAL

## 2018-04-16 MED ORDER — ACETAMINOPHEN 325 MG PO TABS
650.0000 mg | ORAL_TABLET | ORAL | Status: DC | PRN
  Administered 2018-04-17 – 2018-04-18 (×2): 650 mg via ORAL
  Filled 2018-04-16 (×2): qty 2

## 2018-04-16 MED ORDER — DEXAMETHASONE SODIUM PHOSPHATE 10 MG/ML IJ SOLN
INTRAMUSCULAR | Status: DC | PRN
  Administered 2018-04-16: 8 mg via INTRAVENOUS

## 2018-04-16 MED ORDER — ACETAMINOPHEN 10 MG/ML IV SOLN: 1000.0000 mg | Freq: Once | INTRAVENOUS | Status: DC | PRN

## 2018-04-16 MED ORDER — ONDANSETRON HCL 4 MG/2ML IJ SOLN: 4.0000 mg | Freq: Three times a day (TID) | INTRAMUSCULAR | Status: DC | PRN

## 2018-04-16 SURGICAL SUPPLY — 35 items
BENZOIN TINCTURE PRP APPL 2/3 (GAUZE/BANDAGES/DRESSINGS) ×2 IMPLANT
CHLORAPREP W/TINT 26ML (MISCELLANEOUS) ×2 IMPLANT
CLAMP CORD UMBIL (MISCELLANEOUS) IMPLANT
CLOSURE STERI STRIP 1/2 X4 (GAUZE/BANDAGES/DRESSINGS) ×2 IMPLANT
CLOTH BEACON ORANGE TIMEOUT ST (SAFETY) ×2 IMPLANT
DRSG OPSITE POSTOP 4X10 (GAUZE/BANDAGES/DRESSINGS) ×2 IMPLANT
ELECT REM PT RETURN 9FT ADLT (ELECTROSURGICAL) ×2
ELECTRODE REM PT RTRN 9FT ADLT (ELECTROSURGICAL) ×1 IMPLANT
EXTRACTOR VACUUM KIWI (MISCELLANEOUS) IMPLANT
EXTRACTOR VACUUM M CUP 4 TUBE (SUCTIONS) IMPLANT
GLOVE BIO SURGEON STRL SZ7 (GLOVE) ×2 IMPLANT
GLOVE BIOGEL PI IND STRL 7.0 (GLOVE) ×2 IMPLANT
GLOVE BIOGEL PI INDICATOR 7.0 (GLOVE) ×2
GOWN STRL REUS W/TWL LRG LVL3 (GOWN DISPOSABLE) ×4 IMPLANT
KIT ABG SYR 3ML LUER SLIP (SYRINGE) IMPLANT
NEEDLE HYPO 25X5/8 SAFETYGLIDE (NEEDLE) IMPLANT
NS IRRIG 1000ML POUR BTL (IV SOLUTION) ×2 IMPLANT
PACK C SECTION WH (CUSTOM PROCEDURE TRAY) ×2 IMPLANT
PAD OB MATERNITY 4.3X12.25 (PERSONAL CARE ITEMS) ×2 IMPLANT
RTRCTR C-SECT PINK 25CM LRG (MISCELLANEOUS) IMPLANT
SPONGE LAP 18X18 RF (DISPOSABLE) ×6 IMPLANT
STRIP CLOSURE SKIN 1/2X4 (GAUZE/BANDAGES/DRESSINGS) IMPLANT
SUT MNCRL 0 VIOLET CTX 36 (SUTURE) ×2 IMPLANT
SUT MONOCRYL 0 CTX 36 (SUTURE) ×2
SUT PLAIN 0 NONE (SUTURE) IMPLANT
SUT PLAIN 2 0 (SUTURE) ×1
SUT PLAIN ABS 2-0 CT1 27XMFL (SUTURE) ×1 IMPLANT
SUT VIC AB 0 CT1 27 (SUTURE) ×2
SUT VIC AB 0 CT1 27XBRD ANBCTR (SUTURE) ×2 IMPLANT
SUT VIC AB 2-0 CT1 27 (SUTURE) ×1
SUT VIC AB 2-0 CT1 TAPERPNT 27 (SUTURE) ×1 IMPLANT
SUT VIC AB 4-0 KS 27 (SUTURE) ×2 IMPLANT
SUT VICRYL 0 TIES 12 18 (SUTURE) IMPLANT
TOWEL OR 17X24 6PK STRL BLUE (TOWEL DISPOSABLE) ×2 IMPLANT
TRAY FOLEY W/BAG SLVR 14FR LF (SET/KITS/TRAYS/PACK) IMPLANT

## 2018-04-16 NOTE — Progress Notes (Signed)
Patient ID: Braulio BoschFrances L Binns, female   DOB: 19-Mar-1981, 37 y.o.   MRN: [id number] RN called from PACU with vag bleeding with Crede.  Intra-op EBL 152 cc Exam- some bleeding with slightly boggy uterus, vaginal exam no clots noted.  800mcg Cytotec placed.  Stable vitals, stable pt. Unable to assess EBL in PACU but likely 200 cc.   V.Lavone Barrientes, MD

## 2018-04-16 NOTE — Anesthesia Preprocedure Evaluation (Addendum)
Anesthesia Evaluation  Patient identified by MRN, date of birth, ID band Patient awake    Reviewed: Allergy & Precautions, NPO status , Patient's Chart, lab work & pertinent test results  Airway Mallampati: I  TM Distance: >3 FB Neck ROM: Full    Dental no notable dental hx. (+) Teeth Intact, Dental Advisory Given   Pulmonary asthma ,    Pulmonary exam normal breath sounds clear to auscultation       Cardiovascular hypertension, Pt. on medications negative cardio ROS Normal cardiovascular exam Rhythm:Regular Rate:Normal     Neuro/Psych  Headaches, PSYCHIATRIC DISORDERS Anxiety    GI/Hepatic negative GI ROS, Neg liver ROS,   Endo/Other  negative endocrine ROS  Renal/GU negative Renal ROS  negative genitourinary   Musculoskeletal negative musculoskeletal ROS (+)   Abdominal   Peds negative pediatric ROS (+)  Hematology negative hematology ROS (+)   Anesthesia Other Findings Repeat C/S  Reproductive/Obstetrics (+) Pregnancy                            Anesthesia Physical Anesthesia Plan  ASA: III  Anesthesia Plan: Spinal   Post-op Pain Management:    Induction: Intravenous  PONV Risk Score and Plan: 2 and Treatment may vary due to age or medical condition  Airway Management Planned: Natural Airway and Simple Face Mask  Additional Equipment:   Intra-op Plan:   Post-operative Plan:   Informed Consent: I have reviewed the patients History and Physical, chart, labs and discussed the procedure including the risks, benefits and alternatives for the proposed anesthesia with the patient or authorized representative who has indicated his/her understanding and acceptance.   Dental advisory given  Plan Discussed with: CRNA  Anesthesia Plan Comments:         Anesthesia Quick Evaluation

## 2018-04-16 NOTE — Anesthesia Procedure Notes (Signed)
Spinal  Patient location during procedure: OR Start time: 04/16/2018 8:37 AM End time: 04/16/2018 8:47 AM Staffing Anesthesiologist: Elmer PickerWoodrum, Nickol Collister L, MD Performed: anesthesiologist  Preanesthetic Checklist Completed: patient identified, surgical consent, pre-op evaluation, timeout performed, IV checked, risks and benefits discussed and monitors and equipment checked Spinal Block Patient position: sitting Prep: site prepped and draped and DuraPrep Patient monitoring: cardiac monitor, continuous pulse ox and blood pressure Approach: midline Location: L3-4 Injection technique: single-shot Needle Needle type: Pencan  Needle gauge: 24 G Needle length: 9 cm Assessment Sensory level: T6 Additional Notes Functioning IV was confirmed and monitors were applied. Sterile prep and drape, including hand hygiene and sterile gloves were used. The patient was positioned and the spine was prepped. The skin was anesthetized with lidocaine.  Free flow of clear CSF was obtained prior to injecting local anesthetic into the CSF.  The spinal needle aspirated freely following injection.  The needle was carefully withdrawn.  The patient tolerated the procedure well.

## 2018-04-16 NOTE — Anesthesia Postprocedure Evaluation (Signed)
Anesthesia Post Note  Patient: Elizabeth Yang  Procedure(s) Performed: Repeat CESAREAN SECTION (N/A )     Patient location during evaluation: PACU Anesthesia Type: Spinal Level of consciousness: oriented and awake and alert Pain management: pain level controlled Vital Signs Assessment: post-procedure vital signs reviewed and stable Respiratory status: spontaneous breathing, respiratory function stable and patient connected to nasal cannula oxygen Cardiovascular status: blood pressure returned to baseline and stable Postop Assessment: no headache, no backache and no apparent nausea or vomiting Anesthetic complications: no    Last Vitals:  Vitals:   04/16/18 1115 04/16/18 1134  BP: 130/77 127/77  Pulse: 84 82  Resp: 16 16  Temp:  36.7 C  SpO2: 94% 95%    Last Pain:  Vitals:   04/16/18 1134  TempSrc: Oral  PainSc:    Pain Goal:                 Odean Mcelwain L Ambika Zettlemoyer

## 2018-04-16 NOTE — Transfer of Care (Signed)
Immediate Anesthesia Transfer of Care Note  Patient: Elizabeth Yang  Procedure(s) Performed: Repeat CESAREAN SECTION (N/A )  Patient Location: PACU  Anesthesia Type:Spinal  Level of Consciousness: awake and patient cooperative  Airway & Oxygen Therapy: Patient Spontanous Breathing  Post-op Assessment: Report given to RN and Post -op Vital signs reviewed and stable  Post vital signs: Reviewed and stable  Last Vitals:  Vitals Value Taken Time  BP 133/71 04/16/2018 10:07 AM  Temp    Pulse 94 04/16/2018 10:09 AM  Resp 24 04/16/2018 10:09 AM  SpO2 91 % 04/16/2018 10:09 AM  Vitals shown include unvalidated device data.  Last Pain:  Vitals:   04/16/18 0657  TempSrc: Oral         Complications: No apparent anesthesia complications

## 2018-04-16 NOTE — H&P (Signed)
Elizabeth BoschFrances L Yang is a 37 y.o. female G3P1101, presenting for repeat 3rd C/section. Pt with CHTN, well controlled this pregnancy. Growth sono every 4 wks from 25 wks, ANtesting from 32 wks. Recently low AFI noted 2 days back but NST reactive, BPP 8/8. Frequent PIH labs normal  No UCs. +FMs.  G1- severe IUGR, abn Dopplers needing C/s at 27 wks, baby died after a day  G2- term C/s, at 38 wks due to worsening BPs G3- current  OB History    Gravida  3   Para  2   Term  1   Preterm  1   AB  0   Living  1     SAB  0   TAB  0   Ectopic  0   Multiple  0   Live Births  1          Past Medical History:  Diagnosis Date  . Anxiety   . Asthma   . Headache   . History of kidney stones   . Hypertension   . Hypertension affecting pregnancy, antepartum 09/13/2016  . Vaginal Pap smear, abnormal   . Vitamin D deficiency    Past Surgical History:  Procedure Laterality Date  . CESAREAN SECTION N/A 09/27/2014   Procedure: CESAREAN SECTION;  Surgeon: Genia DelMarie-Lyne Lavoie, MD;  Location: [facility] OR[facility];  Service: Obstetrics;  Laterality: N/A;  . CESAREAN SECTION N/A 09/13/2016   Procedure: Repeat CESAREAN SECTION;  Surgeon: Shea EvansVaishali Burnie Therien, MD;  [facility];  Service: Obstetrics;  Laterality: N/A;  EDD: 09/26/16   . TONSILLECTOMY    . WISDOM TOOTH EXTRACTION     Family History: family history includes Cancer in her maternal grandmother; Diabetes in her paternal grandmother; Heart disease in her maternal aunt, maternal grandfather, maternal uncle, and mother; Hypertension in her mother; Kidney disease in her sister. Social History:  reports that she has never smoked. She has never used smokeless tobacco. She reports that she does not drink alcohol or use drugs.     Maternal Diabetes: No Genetic Screening: Normal Maternal Ultrasounds/Referrals: Normal Fetal Ultrasounds or other Referrals:  None Maternal Substance Abuse:  No Significant Maternal Medications:  Meds include:  Other:  Labetalol, Amlodipine  Significant Maternal Lab Results:  Lab values include: Group B Strep positive Other Comments:  None  ROS no HA/ vision changes/ RUQ pain/ swelling  History   Last menstrual period 07/17/2017, currently breastfeeding. Exam Physical Exam  BP 126/83   Pulse 89   Temp 97.9 F (36.6 C) (Oral)   Resp 20   Ht 5\' 2"  (1.575 m)   Wt 92.5 kg   LMP 07/17/2017   BMI 37.30 kg/m    Physical exam:  A&O x 3, no acute distress. Pleasant HEENT neg, no thyromegaly Lungs CTA bilat CV RRR, S1S2 normal Abdo soft, non tender, non acute Extr no edema/ tenderness Pelvic def FHT  130s Toco none  Prenatal labs: ABO, Rh: --/--/A POS (11/11 1020) Antibody: NEG (11/11 1020) Rubella: Immune (04/26 0000) RPR: Non Reactive (11/11 1017)  HBsAg: Negative (04/26 0000)  HIV: Non-reactive (04/26 0000)  GBS: Positive (10/18 0000)   Assessment/Plan: 37 yo G3P1101, 39 wks, here for 3rd C/section. Does not want sterilization CHTN, well controlled this pregnancy. AGA fetal growth.   Risks/complications of surgery reviewed incl infection, bleeding, damage to internal organs including bladder, bowels, ureters, blood vessels, other risks from anesthesia, VTE and delayed complications of any surgery, complications in future surgery reviewed. Also discussed neonatal  complications incl difficult delivery, laceration, vacuum assistance, TTN etc. Pt understands and agrees, all concerns addressed.     Robley Fries 04/16/2018, 6:28 AM

## 2018-04-16 NOTE — Op Note (Signed)
Cesarean Section Procedure Note 04/16/2018 Elizabeth Yang  Procedure: Repeat Low transverse cesarean section.   Indications: Prior C/section x2. 39 wks. Chronic HTN  Pre-operative Diagnosis: Previous Cesarean Section.   Post-operative Diagnosis: Same   Surgeon:  Shea EvansMody, Loraina Stauffer, MD   Assistants: Carlean JewsMeredith Sigmon, CNM   Anesthesia: spinal   Procedure Details:  The patient was seen in the Holding Room. The risks, benefits, complications, treatment options, and expected outcomes were discussed with the patient. The patient concurred with the proposed plan, giving informed consent. identified as Elizabeth Yang and the procedure verified as C-Section Delivery. A Time Out was held and the above information confirmed. 2 gm Ancef given.  After induction of anesthesia, the patient was draped and prepped in the usual sterile manner and foley placed. A Pfannenstiel Incision was made and carried down through the subcutaneous tissue to the fascia. Fascial incision was made and extended transversely. The fascia was separated from the underlying rectus tissue superiorly and inferiorly. Scar tissue under fascia and midline carefully dissected to assess peritoneal window and entered. Peritoneal incision was extended longitudinally. The utero-vesical peritoneal reflection was incised transversely and the bladder flap was bluntly freed from the lower uterine segment. Alexis retractor placed after that.  A low transverse uterine incision was made. Clear amniotic fluid drained. Delivered from cephalic presentation was a FEMALE infant at 9.16 AM with Apgar scores of 9 at one minute and 9 at five minutes. Delayed cord clamping done at 1 minute. Cord blood sent. Cord pH not needed. The placenta was removed Intact and appeared normal. The uterine outline, tubes and ovaries appeared normal}. The uterine incision was closed with running locked sutures of 0 Monocryl followed by another imbricating layer. Hemostasis was  observed. Alexis removed. Peritoneal closure with 2-0 Vicryl. Muscles approximated in midline to close over the bladder bulge. The fascia was then reapproximated with running sutures of 0Vicryl. The subcuticular closure was performed using 2-0plain gut. The skin was closed with 4-0Vicryl. steristrips and sterile dressing placed.   Instrument, sponge, and needle counts were correct prior the abdominal closure and were correct at the conclusion of the case.   Findings: Female infant delivered from low transverse hysterotomy cephalic at 9.16 am. Apgars 9, 9. Normal placenta, uterus, tubes, ovaries.    Estimated Blood Loss: 152 mL   Total IV Fluids: 2350 cc LR   Urine Output: 150CC OF clear urine  Specimens: cord blood   Complications: no complications  Disposition: PACU - hemodynamically stable.   Maternal Condition: stable   Baby condition / location:  Couplet care / Skin to Skin  Attending Attestation: I performed the procedure.   Signed: Surgeon(s): Shea EvansMody, Jaquarius Seder, MD

## 2018-04-16 NOTE — Lactation Note (Signed)
This note was copied from a baby's chart. Lactation Consultation Note  Patient Name: Elizabeth Yang ZOXWR'UToday's Date: 04/16/2018 Reason for consult: Initial assessment;Term P2, 14 hour female infant  BF concerns: infant having emesis, not latching at breast currently last BF attempt was 6:40 pm, mom's 3rd c/section delivery and mom has hx of CHTN. Mom made attempts  To BF but infant  Is reluctant to latch at this time. Per mom, infant had a lot of emesis.  Per parents, infant had 2 voids and 3 stools since delivery. Mom is an experience BF, she BF her other son for 13 months. Per mom, it was difficult to latch her other son at first but eventually he did latch.  Mom taught back hand expression and easily expressed 10 ml of colostrum, infant took 5 ml of colostrum by spoon. LC discussed I&O. Reviewed Baby & Me book's Breastfeeding Basics.  Mom made aware of O/P services, breastfeeding support groups, community resources, and our phone # for post-discharge questions.  Mom's BF goals within first 24 hours: 1. BF according hunger cues, 8 to 12 times within 24 hours including nights. 2. Mom will do STS as much as possible. 3. Wil continue to latch infant to breast at next feeding if infant is reluctant she will give other 5 ml of EBM she expressed by spoon for next feeding. 4. Mom will continue latching infant to breast and hand expressing to give back volume.   Maternal Data Formula Feeding for Exclusion: No Has patient been taught Hand Expression?: Yes(Mom easily hand expressed 10 ml of colostrum infant took 5ml by spoon.) Does the patient have breastfeeding experience prior to this delivery?: Yes  Feeding Feeding Type: Breast Fed  LATCH Score                   Interventions Interventions: Breast feeding basics reviewed;Hand express  Lactation Tools Discussed/Used WIC Program: No   Consult Status Consult Status: Follow-up Date: 04/17/18 Follow-up type:  In-patient    Danelle EarthlyRobin Seirra Kos 04/16/2018, 11:24 PM

## 2018-04-17 LAB — CBC
HEMATOCRIT: 32.1 % — AB (ref 36.0–46.0)
HEMOGLOBIN: 11 g/dL — AB (ref 12.0–15.0)
MCH: 30.3 pg (ref 26.0–34.0)
MCHC: 34.3 g/dL (ref 30.0–36.0)
MCV: 88.4 fL (ref 80.0–100.0)
Platelets: 155 10*3/uL (ref 150–400)
RBC: 3.63 MIL/uL — ABNORMAL LOW (ref 3.87–5.11)
RDW: 13.9 % (ref 11.5–15.5)
WBC: 13.3 10*3/uL — ABNORMAL HIGH (ref 4.0–10.5)
nRBC: 0 % (ref 0.0–0.2)

## 2018-04-17 LAB — BIRTH TISSUE RECOVERY COLLECTION (PLACENTA DONATION)

## 2018-04-17 NOTE — Addendum Note (Signed)
Addendum  created 04/17/18 1643 by Renford DillsMullins, Juleah Paradise L, CRNA   Sign clinical note

## 2018-04-17 NOTE — Progress Notes (Signed)
[name] was referred for history of depression/anxiety. * Referral screened out by Clinical Social Worker because none of the following criteria appear to apply: ~ History of anxiety/depression during this pregnancy, or of post-partum depression following prior delivery. ~ Diagnosis of anxiety and/or depression within last 3 years OR * [name] symptoms currently being treated with medication and/or therapy. Please contact the Clinical Social Worker if needs arise, by [name] request, or if [name] scores greater than 9/yes to question 10 on Edinburgh Postpartum Depression Screen.  Elizabeth Yang, LCSWA Clinical Social Worker [facility] Cell#: [phone]            

## 2018-04-17 NOTE — Progress Notes (Signed)
Subjective: Postpartum Day 1: Cesarean Delivery Patient reports nausea, incisional pain, tolerating PO, + flatus and no problems voiding.    Objective: Vital signs in last 24 hours: Temp:  [97.8 F (36.6 C)-99.1 F (37.3 C)] 98.2 F (36.8 C) (11/14 1008) Pulse Rate:  [64-84] 71 (11/14 1008) Resp:  [18-20] 18 (11/14 1008) BP: (123-137)/(74-90) 123/84 (11/14 1008) SpO2:  [95 %-100 %] 100 % (11/14 1008)  Physical Exam:  General: alert, cooperative and appears stated age Lochia: appropriate Uterine Fundus: firm Incision: no significant drainage DVT Evaluation: No evidence of DVT seen on physical exam. Negative Homan's sign.  Recent Labs    04/17/18 0628  HGB 11.0*  HCT 32.1*    Assessment/Plan: Stable POD 1  Status post Cesarean section. Doing well postoperatively.  Continue current care.  Arine Foley J 04/17/2018, 11:59 AM

## 2018-04-17 NOTE — Lactation Note (Signed)
This note was copied from a baby's chart. Lactation Consultation Note  Patient Name: Elizabeth Yang ZOXWR'UToday's Date: 04/17/2018 Reason for consult: Follow-up assessment;Difficult latch;Mother's request;Term P2, 35 hour female infant  BF concerns: infant not latching to left breast, family hx hyperbilirubinemia infant at 11.7 will have repeat serum at 2300. Per mom, infant is latching well to right breast, [facility] and supplement infant with 10 ml of EBM by curve tip syringe. Mom is using DEBP and had good supply of colostrum mom had additional 10 ml colostrum in collection tube for next feeding. LC notice mom had dimpling, both nipples are ( inverted in center) but nipple shaft  elongated for infant to latch.  BF current  Plan:  1. Mom will continue BF according hunger cues and give back Supplement EBM after feedings based on age/ hours for birth  2. LC suggested will try NS on left breast only,  LC fitted mom with 20 and 24 mm NS, mom taught back how to use and stated she used NS with 2nd child. 3. LC discussed nurse will assist with next feeding using NS, informed nurse of plan , mom will have nurse assist  or can call for LC .  Maternal Data     Feeding Feeding Type: Breast Milk  LATCH Score                   Interventions    Lactation Tools Discussed/Used     Consult Status      Elizabeth Yang 04/17/2018, 9:02 PM

## 2018-04-17 NOTE — Anesthesia Postprocedure Evaluation (Signed)
Anesthesia Post Note  Patient: Elizabeth Yang  Procedure(s) Performed: Repeat CESAREAN SECTION (N/A )     Patient location during evaluation: Mother Baby Anesthesia Type: Spinal Level of consciousness: awake and alert Pain management: pain level controlled Vital Signs Assessment: post-procedure vital signs reviewed and stable Respiratory status: spontaneous breathing, nonlabored ventilation and respiratory function stable Cardiovascular status: stable Postop Assessment: no headache, no backache and epidural receding Anesthetic complications: no    Last Vitals:  Vitals:   04/17/18 1008 04/17/18 1350  BP: 123/84 120/79  Pulse: 71 72  Resp: 18 16  Temp: 36.8 C 37.2 C  SpO2: 100%     Last Pain:  Vitals:   04/17/18 1350  TempSrc: Oral  PainSc:    Pain Goal:                 [name]

## 2018-04-18 MED ORDER — IBUPROFEN 600 MG PO TABS: 600.0000 mg | ORAL_TABLET | Freq: Four times a day (QID) | ORAL | 0 refills | Status: DC

## 2018-04-18 MED ORDER — LABETALOL HCL 300 MG PO TABS: 150.0000 mg | ORAL_TABLET | Freq: Every day | ORAL | 0 refills | Status: DC

## 2018-04-18 MED ORDER — OXYCODONE HCL 10 MG PO TABS: 10.0000 mg | ORAL_TABLET | ORAL | 0 refills | Status: DC | PRN

## 2018-04-18 MED ORDER — SIMETHICONE 80 MG PO CHEW: 80.0000 mg | CHEWABLE_TABLET | ORAL | 0 refills | Status: DC | PRN

## 2018-04-18 MED ORDER — ACETAMINOPHEN 325 MG PO TABS: 650.0000 mg | ORAL_TABLET | ORAL | Status: DC | PRN

## 2018-04-18 NOTE — Discharge Summary (Signed)
OB Discharge Summary  Patient Name: Elizabeth BoschFrances L Gorniak DOB: [birth year] MRN: [id number]  Date of admission: 04/16/2018 Delivering provider: MODY, VAISHALI   Date of discharge: 04/18/2018  Admitting diagnosis: Previous Cesarean Section Intrauterine pregnancy: 7318w0d     Secondary diagnosis:Principal Problem:   Postpartum care following cesarean delivery (11/13) Active Problems:   Cesarean delivery delivered   Chronic hypertension affecting pregnancy  Additional problems:none     Discharge diagnosis:  Patient Active Problem List   Diagnosis Date Noted  . Chronic hypertension affecting pregnancy 04/16/2018  . Postpartum care following cesarean delivery (11/13) 04/16/2018  . Cesarean delivery delivered 09/13/2016                                                                Post partum procedures:none Pain control: Spinal  Complications: None  Hospital course:  Sceduled C/S   37 y.o. yo G3P2102 at 3918w0d was admitted to the hospital 04/16/2018 for scheduled cesarean section with the following indication:Elective Repeat and Prior Uterine Surgery.  Membrane Rupture Time/Date: 9:15 AM ,04/16/2018   Patient delivered a Viable infant.04/16/2018  Details of operation can be found in separate operative note.  Pateint had an uncomplicated postpartum course.  She is ambulating, tolerating a regular diet, passing flatus, and urinating well. Patient is discharged home in stable condition on  04/18/18         Physical exam  Vitals:   04/17/18 1350 04/17/18 2115 04/18/18 0519 04/18/18 1022  BP: 120/79 127/86 132/87 129/87  Pulse: 72 81 75 83  Resp: 16 18 20    Temp: 98.9 F (37.2 C) 98.2 F (36.8 C) 97.9 F (36.6 C)   TempSrc: Oral Oral Oral   SpO2:  98% 99%   Weight:      Height:       General: alert, cooperative and no distress Lochia: appropriate Uterine Fundus: firm Incision: Healing well with no significant drainage DVT Evaluation: No cords or calf tenderness. No  significant calf/ankle edema. Labs: Lab Results  Component Value Date   WBC 13.3 (H) 04/17/2018   HGB 11.0 (L) 04/17/2018   HCT 32.1 (L) 04/17/2018   MCV 88.4 04/17/2018   PLT 155 04/17/2018   CMP Latest Ref Rng & Units 04/14/2018  Glucose 70 - 99 mg/dL 89  BUN 6 - 20 mg/dL 16  Creatinine 4.090.44 - 8.111.00 mg/dL 9.140.74  Sodium 782135 - 956145 mmol/L 133(L)  Potassium 3.5 - 5.1 mmol/L 4.2  Chloride 98 - 111 mmol/L 107  CO2 22 - 32 mmol/L 19(L)  Calcium 8.9 - 10.3 mg/dL 2.1(H8.4(L)  Total Protein 6.5 - 8.1 g/dL 6.4(L)  Total Bilirubin 0.3 - 1.2 mg/dL 0.4  Alkaline Phos 38 - 126 U/L 135(H)  AST 15 - 41 U/L 14(L)  ALT 0 - 44 U/L 18    Vaccines: TDaP UTD         Flu    declines  Discharge instruction: per After Visit Summary and "Baby and Me Booklet".  After Visit Meds:  Allergies as of 04/18/2018   No Known Allergies     Medication List    TAKE these medications   acetaminophen 325 MG tablet Commonly known as:  TYLENOL Take 2 tablets (650 mg total) by mouth every 4 (four) hours as needed (for pain scale <  4).   albuterol 108 (90 Base) MCG/ACT inhaler Commonly known as:  PROVENTIL HFA;VENTOLIN HFA Inhale 2 puffs into the lungs every 6 (six) hours as needed for wheezing or shortness of breath.   amLODipine 10 MG tablet Commonly known as:  NORVASC Take 10 mg by mouth daily.   aspirin EC 81 MG tablet Take 81 mg by mouth daily.   butalbital-acetaminophen-caffeine 50-325-40 MG tablet Commonly known as:  FIORICET, ESGIC Take 1 tablet by mouth 2 (two) times daily as needed for migraine.   coconut oil Oil Apply 1 application topically as needed.   ibuprofen 600 MG tablet Commonly known as:  ADVIL,MOTRIN Take 1 tablet (600 mg total) by mouth every 6 (six) hours.   labetalol 300 MG tablet Commonly known as:  NORMODYNE Take 0.5 tablets (150 mg total) by mouth daily. Start taking on:  04/19/2018   Magnesium 250 MG Tabs Take 250 mg by mouth daily.   Oxycodone HCl 10 MG Tabs Take  1 tablet (10 mg total) by mouth every 4 (four) hours as needed (pain scale > 7).   prenatal multivitamin Tabs tablet Take 1 tablet by mouth at bedtime.   simethicone 80 MG chewable tablet Commonly known as:  MYLICON Chew 1 tablet (80 mg total) by mouth as needed for flatulence.       Diet: routine diet  Activity: Advance as tolerated. Pelvic rest for 6 weeks.   Postpartum contraception: Not Discussed  Newborn Data: Live born female  Birth Weight: 8 lb 3.4 oz (3725 g) APGAR: 9, 9  Newborn Delivery   Birth date/time:  04/16/2018 09:16:00 Delivery type:  C-Section, Low Vertical Trial of labor:  No C-section categorization:  Repeat    Baby Feeding: Breast Disposition:home with mother   Delivery Report:  Review the Delivery Report for details.    Follow up: Follow-up Information    Shea Evans, MD. Schedule an appointment as soon as possible for a visit in 6 week(s).   Specialty:  Obstetrics and Gynecology Contact information: [address] [place] [address] [phone]             Signed: Cipriano Mile, MSN 04/18/2018, 10:38 AM

## 2018-04-20 ENCOUNTER — Encounter (HOSPITAL_COMMUNITY): Payer: Self-pay

## 2018-04-20 ENCOUNTER — Inpatient Hospital Stay (HOSPITAL_COMMUNITY)
Admission: AD | Admit: 2018-04-20 | Discharge: 2018-04-20 | Disposition: A | Discharge status: Home / Self Care | Payer: BC Managed Care – PPO | Source: Ambulatory Visit | Attending: Obstetrics & Gynecology | Admitting: Obstetrics & Gynecology

## 2018-04-20 DIAGNOSIS — I10 Essential (primary) hypertension: Secondary | ICD-10-CM

## 2018-04-20 DIAGNOSIS — R51 Headache: Secondary | ICD-10-CM | POA: Insufficient documentation

## 2018-04-20 DIAGNOSIS — Z7982 Long term (current) use of aspirin: Secondary | ICD-10-CM | POA: Diagnosis not present

## 2018-04-20 DIAGNOSIS — Z79899 Other long term (current) drug therapy: Secondary | ICD-10-CM | POA: Insufficient documentation

## 2018-04-20 DIAGNOSIS — O1093 Unspecified pre-existing hypertension complicating the puerperium: Secondary | ICD-10-CM | POA: Diagnosis not present

## 2018-04-20 DIAGNOSIS — O9089 Other complications of the puerperium, not elsewhere classified: Secondary | ICD-10-CM | POA: Insufficient documentation

## 2018-04-20 DIAGNOSIS — O10919 Unspecified pre-existing hypertension complicating pregnancy, unspecified trimester: Secondary | ICD-10-CM

## 2018-04-20 LAB — URINALYSIS, ROUTINE W REFLEX MICROSCOPIC
BACTERIA UA: NONE SEEN
Bilirubin Urine: NEGATIVE
GLUCOSE, UA: NEGATIVE mg/dL
KETONES UR: NEGATIVE mg/dL
NITRITE: NEGATIVE
PH: 6 (ref 5.0–8.0)
PROTEIN: NEGATIVE mg/dL
Specific Gravity, Urine: 1.011 (ref 1.005–1.030)

## 2018-04-20 LAB — CBC WITH DIFFERENTIAL/PLATELET
BASOS PCT: 0 %
Basophils Absolute: 0 10*3/uL (ref 0.0–0.1)
EOS ABS: 0 10*3/uL (ref 0.0–0.5)
Eosinophils Relative: 1 %
HCT: 36.3 % (ref 36.0–46.0)
Hemoglobin: 12 g/dL (ref 12.0–15.0)
Lymphocytes Relative: 26 %
Lymphs Abs: 1.9 10*3/uL (ref 0.7–4.0)
MCH: 29.9 pg (ref 26.0–34.0)
MCHC: 33.1 g/dL (ref 30.0–36.0)
MCV: 90.5 fL (ref 80.0–100.0)
MONO ABS: 0.5 10*3/uL (ref 0.1–1.0)
MONOS PCT: 6 %
Neutro Abs: 5 10*3/uL (ref 1.7–7.7)
Neutrophils Relative %: 67 %
PLATELETS: 201 10*3/uL (ref 150–400)
RBC: 4.01 MIL/uL (ref 3.87–5.11)
RDW: 14.2 % (ref 11.5–15.5)
WBC: 7.4 10*3/uL (ref 4.0–10.5)
nRBC: 0 % (ref 0.0–0.2)

## 2018-04-20 LAB — COMPREHENSIVE METABOLIC PANEL
ALBUMIN: 3.1 g/dL — AB (ref 3.5–5.0)
ALT: 59 U/L — ABNORMAL HIGH (ref 0–44)
ANION GAP: 9 (ref 5–15)
AST: 23 U/L (ref 15–41)
Alkaline Phosphatase: 106 U/L (ref 38–126)
BUN: 19 mg/dL (ref 6–20)
CHLORIDE: 107 mmol/L (ref 98–111)
CO2: 21 mmol/L — AB (ref 22–32)
Calcium: 8.8 mg/dL — ABNORMAL LOW (ref 8.9–10.3)
Creatinine, Ser: 0.78 mg/dL (ref 0.44–1.00)
GFR calc Af Amer: >60 mL/min (ref >60)
GFR calc non Af Amer: >60 mL/min (ref >60)
GLUCOSE: 86 mg/dL (ref 70–99)
POTASSIUM: 3.8 mmol/L (ref 3.5–5.1)
SODIUM: 137 mmol/L (ref 135–145)
Total Bilirubin: 0.4 mg/dL (ref 0.3–1.2)
Total Protein: 6.6 g/dL (ref 6.5–8.1)

## 2018-04-20 LAB — PROTEIN / CREATININE RATIO, URINE
Creatinine, Urine: 52 mg/dL
PROTEIN CREATININE RATIO: 0.19 mg/mg{creat} — AB (ref 0.00–0.15)
TOTAL PROTEIN, URINE: 10 mg/dL

## 2018-04-20 NOTE — MAU Note (Signed)
S/P C-sect. Has had rt sided "pulsating" headache since yesterday afternoon. Took Fiorcet and PP pain meds, but nothing has helped. Has hx of migraines. Has chronic hypertension.  BP at 142/97.   It has come and gone, with movement. Is mild now but has been worse throughout the day. Denies visual disturbances. Denies N/V or RUQ pain. Says rt foot is slightly swollen.

## 2018-04-20 NOTE — MAU Provider Note (Signed)
History     CSN: [id number]  Arrival date and time: 04/20/18 4098   First Provider Initiated Contact with Patient 04/20/18 1948      Chief Complaint  Patient presents with  . Headache   HPI  Elizabeth Yang is a 37 y.o. [phone] postpartum patient who presents to MAU for evaluation of elevated blood pressures at home. She is s/p repeat LTCS 04/16/18. Pregnancy complicated by chronic hypertension. Patient states she is compliant with prescribed Labetalol 150mg   And Norvasc 10 daily. Endorses elevated blood pressure of 142/97 at home this evening.   Headache This is a chronic problem. Patient endorses history of migraines and headaches throughout her pregnancy for which she takes Fioricet. Upon arrival to MAU she endorses bilateral frontal headache 1-2/10. Denies visual disturbances. Patient states she took Fioricet approximately 30 minutes before arriving to MAU.  Swelling in lower extremities This is a new problem, onset today. Patient endorses concern that her right ankle is more swollen than her left. Denies calf pain, discoloration, pain with ambulation.  Patient also denies RUQ pain, SOB, dizziness, fever.  OB History    Gravida  3   Para  3   Term  2   Preterm  1   AB  0   Living  3     SAB  0   TAB  0   Ectopic  0   Multiple  0   Live Births  3           Past Medical History:  Diagnosis Date  . Anxiety   . Asthma   . Headache   . History of kidney stones   . Hypertension   . Hypertension affecting pregnancy, antepartum 09/13/2016  . Vaginal Pap smear, abnormal   . Vitamin D deficiency     Past Surgical History:  Procedure Laterality Date  . CESAREAN SECTION N/A 09/27/2014   Procedure: CESAREAN SECTION;  Surgeon: Genia Del, MD;  Location: [facility] ORS;  Service: Obstetrics;  Laterality: N/A;  . CESAREAN SECTION N/A 09/13/2016   Procedure: Repeat CESAREAN SECTION;  Surgeon: Shea Evans, MD;  Location: [facility] BIRTHING SUITES;  Service:  Obstetrics;  Laterality: N/A;  EDD: 09/26/16   . CESAREAN SECTION N/A 04/16/2018   Procedure: Repeat CESAREAN SECTION;  Surgeon: Shea Evans, MD;  Location: [facility] BIRTHING SUITES;  Service: Obstetrics;  Laterality: N/A;  EDD: 04/23/18  . TONSILLECTOMY    . WISDOM TOOTH EXTRACTION      Family History  Problem Relation Age of Onset  . Heart disease Mother   . Hypertension Mother   . Kidney disease Sister   . Heart disease Maternal Aunt   . Heart disease Maternal Uncle   . Cancer Maternal Grandmother   . Heart disease Maternal Grandfather   . Diabetes Paternal Grandmother     Social History   Tobacco Use  . Smoking status: Never Smoker  . Smokeless tobacco: Never Used  Substance Use Topics  . Alcohol use: No  . Drug use: No    Allergies: No Known Allergies  Medications Prior to Admission  Medication Sig Dispense Refill Last Dose  . acetaminophen (TYLENOL) 325 MG tablet Take 2 tablets (650 mg total) by mouth every 4 (four) hours as needed (for pain scale < 4).   04/20/2018 at Unknown time  . amLODipine (NORVASC) 10 MG tablet Take 10 mg by mouth daily.   04/20/2018 at Unknown time  . aspirin EC 81 MG tablet Take 81 mg by mouth daily.  04/20/2018 at Unknown time  . butalbital-acetaminophen-caffeine (FIORICET, ESGIC) 50-325-40 MG tablet Take 1 tablet by mouth 2 (two) times daily as needed for migraine.   04/20/2018 at Unknown time  . ibuprofen (ADVIL,MOTRIN) 600 MG tablet Take 1 tablet (600 mg total) by mouth every 6 (six) hours. 30 tablet 0 04/20/2018 at Unknown time  . labetalol (NORMODYNE) 300 MG tablet Take 0.5 tablets (150 mg total) by mouth daily. 30 tablet 0 04/20/2018 at Unknown time  . Prenatal Vit-Fe Fumarate-FA (PRENATAL MULTIVITAMIN) TABS tablet Take 1 tablet by mouth at bedtime.    04/20/2018 at Unknown time  . albuterol (PROVENTIL HFA;VENTOLIN HFA) 108 (90 BASE) MCG/ACT inhaler Inhale 2 puffs into the lungs every 6 (six) hours as needed for wheezing or shortness of  breath.   More than a month at Unknown time  . coconut oil OIL Apply 1 application topically as needed. (Patient not taking: Reported on 04/08/2018)  0 Not Taking at Unknown time  . Magnesium 250 MG TABS Take 250 mg by mouth daily.    04/04/2018 at Unknown time  . oxyCODONE 10 MG TABS Take 1 tablet (10 mg total) by mouth every 4 (four) hours as needed (pain scale > 7). 30 tablet 0   . simethicone (MYLICON) 80 MG chewable tablet Chew 1 tablet (80 mg total) by mouth as needed for flatulence. 30 tablet 0     Review of Systems  Constitutional: Negative for fatigue and fever.  Respiratory: Negative for shortness of breath.   Gastrointestinal: Negative for abdominal pain, nausea and vomiting.  Genitourinary: Positive for vaginal bleeding. Negative for difficulty urinating, dyspareunia, vaginal discharge and vaginal pain.  Musculoskeletal: Negative for back pain.  Neurological: Negative for headaches.  All other systems reviewed and are negative.  Physical Exam   Blood pressure 132/88, pulse 70, temperature 98.3 F (36.8 C), temperature source Oral, resp. rate 18, height 5\' 2"  (1.575 m), weight 88 kg, SpO2 100 %, unknown if currently breastfeeding.  Physical Exam  Nursing note and vitals reviewed. Constitutional: She is oriented to person, place, and time. She appears well-developed and well-nourished.  Cardiovascular: Normal rate, intact distal pulses and normal pulses.  Respiratory: Effort normal. No accessory muscle usage. No respiratory distress.  GI: Soft. She exhibits no distension. There is no tenderness. There is no rebound and no guarding.  Neurological: She is alert and oriented to person, place, and time. She has normal reflexes.  Skin: Skin is warm and dry.  Psychiatric: She has a normal mood and affect. Her behavior is normal. Judgment and thought content normal.    MAU Course/MDM    --Headache rated as 2/10, patient declining medication throughout evaluation in MAU --+2 edema  noted bilaterally in lower extremities. Ankle and calf measuring identical size --No signs of DVT on physical exam --Negative Preeclampsia labs. Elevated ALT Patient Vitals for the past 24 hrs:  BP Temp Temp src Pulse Resp SpO2 Height Weight  04/20/18 2015 (!) 129/91 - - 69 - - - -  04/20/18 2000 133/89 - - 74 - - - -  04/20/18 1945 132/88 - - 70 - - - -  04/20/18 1920 127/86 98.3 F (36.8 C) Oral 74 18 100 % 5\' 2"  (1.575 m) 88 kg    Results for orders placed or performed during the hospital encounter of 04/20/18 (from the past 24 hour(s))  Urinalysis, Routine w reflex microscopic     Status: Abnormal   Collection Time: 04/20/18  8:27 PM  Result Value Ref  Range   Color, Urine STRAW (A) YELLOW   APPearance CLEAR CLEAR   Specific Gravity, Urine 1.011 1.005 - 1.030   pH 6.0 5.0 - 8.0   Glucose, UA NEGATIVE NEGATIVE mg/dL   Hgb urine dipstick MODERATE (A) NEGATIVE   Bilirubin Urine NEGATIVE NEGATIVE   Ketones, ur NEGATIVE NEGATIVE mg/dL   Protein, ur NEGATIVE NEGATIVE mg/dL   Nitrite NEGATIVE NEGATIVE   Leukocytes, UA MODERATE (A) NEGATIVE   RBC / HPF 0-5 0 - 5 RBC/hpf   WBC, UA 6-10 0 - 5 WBC/hpf   Bacteria, UA NONE SEEN NONE SEEN   Squamous Epithelial / LPF 0-5 0 - 5   Mucus PRESENT    Non Squamous Epithelial 0-5 (A) NONE SEEN  Protein / creatinine ratio, urine     Status: Abnormal   Collection Time: 04/20/18  8:27 PM  Result Value Ref Range   Creatinine, Urine 52.00 mg/dL   Total Protein, Urine 10 mg/dL   Protein Creatinine Ratio 0.19 (H) 0.00 - 0.15 mg/mg[Cre]  CBC with Differential/Platelet     Status: None   Collection Time: 04/20/18  8:39 PM  Result Value Ref Range   WBC 7.4 4.0 - 10.5 K/uL   RBC 4.01 3.87 - 5.11 MIL/uL   Hemoglobin 12.0 12.0 - 15.0 g/dL   HCT 27.2 53.6 - 64.4 %   MCV 90.5 80.0 - 100.0 fL   MCH 29.9 26.0 - 34.0 pg   MCHC 33.1 30.0 - 36.0 g/dL   RDW 03.4 74.2 - 59.5 %   Platelets 201 150 - 400 K/uL   nRBC 0.0 0.0 - 0.2 %   Neutrophils Relative %  67 %   Neutro Abs 5.0 1.7 - 7.7 K/uL   Lymphocytes Relative 26 %   Lymphs Abs 1.9 0.7 - 4.0 K/uL   Monocytes Relative 6 %   Monocytes Absolute 0.5 0.1 - 1.0 K/uL   Eosinophils Relative 1 %   Eosinophils Absolute 0.0 0.0 - 0.5 K/uL   Basophils Relative 0 %   Basophils Absolute 0.0 0.0 - 0.1 K/uL  Comprehensive metabolic panel     Status: Abnormal   Collection Time: 04/20/18  8:39 PM  Result Value Ref Range   Sodium 137 135 - 145 mmol/L   Potassium 3.8 3.5 - 5.1 mmol/L   Chloride 107 98 - 111 mmol/L   CO2 21 (L) 22 - 32 mmol/L   Glucose, Bld 86 70 - 99 mg/dL   BUN 19 6 - 20 mg/dL   Creatinine, Ser 6.38 0.44 - 1.00 mg/dL   Calcium 8.8 (L) 8.9 - 10.3 mg/dL   Total Protein 6.6 6.5 - 8.1 g/dL   Albumin 3.1 (L) 3.5 - 5.0 g/dL   AST 23 15 - 41 U/L   ALT 59 (H) 0 - 44 U/L   Alkaline Phosphatase 106 38 - 126 U/L   Total Bilirubin 0.4 0.3 - 1.2 mg/dL   GFR calc non Af Amer >60 >60 mL/min   GFR calc Af Amer >60 >60 mL/min   Anion gap 9 5 - 15   Assessment and Plan  --37 y.o. [id number] s/p LTCS 04/16/18 --Mild headache, normotensive --Negative preeclampsia labs --Discharge home in stable condition  F/U: Patient advised to have follow-up blood pressure check in clinic tomorrow or Tuesday. Voicemail left for clinic staff.  Chief complaint, HPI, results and management discussed with Dr. Shawnie Pons  And Dr. Ernestina Penna prior to discharge.  Calvert Cantor, CNM 04/20/2018, 10:00 PM

## 2018-04-20 NOTE — Discharge Instructions (Signed)

## 2018-09-03 ENCOUNTER — Other Ambulatory Visit: Payer: Self-pay

## 2018-09-03 ENCOUNTER — Telehealth: Payer: Self-pay

## 2018-09-03 DIAGNOSIS — O10919 Unspecified pre-existing hypertension complicating pregnancy, unspecified trimester: Secondary | ICD-10-CM

## 2018-09-03 MED ORDER — LABETALOL HCL 300 MG PO TABS: 150.0000 mg | ORAL_TABLET | Freq: Every day | ORAL | 0 refills | Status: DC

## 2018-09-03 NOTE — Telephone Encounter (Signed)
Yes, according to allscripts she has a appt for Dec 2020

## 2018-10-13 ENCOUNTER — Other Ambulatory Visit: Payer: Self-pay | Admitting: Cardiology

## 2018-10-13 DIAGNOSIS — O10919 Unspecified pre-existing hypertension complicating pregnancy, unspecified trimester: Secondary | ICD-10-CM

## 2018-10-13 MED ORDER — LABETALOL HCL 300 MG PO TABS: 150.0000 mg | ORAL_TABLET | Freq: Every day | ORAL | 4 refills | Status: DC

## 2018-11-19 ENCOUNTER — Telehealth: Payer: Self-pay

## 2018-11-19 DIAGNOSIS — O10919 Unspecified pre-existing hypertension complicating pregnancy, unspecified trimester: Secondary | ICD-10-CM

## 2018-11-19 NOTE — Telephone Encounter (Signed)
Pt called stating that she used to be on Labetalol bid but it was decreased to qd. Her bp has been running 120/95-100. She is concerned about bottom number. Wants to know if she should increase it back to bid. Please review and advise.//ah

## 2018-11-19 NOTE — Telephone Encounter (Signed)
When did I last see her. I am good to increase to BID. Maybe she may want to see [place] some time. I did not check [employer]

## 2018-11-20 MED ORDER — LABETALOL HCL 300 MG PO TABS: 300.0000 mg | ORAL_TABLET | Freq: Two times a day (BID) | ORAL | 4 refills | Status: DC

## 2018-11-20 NOTE — Telephone Encounter (Signed)
Pt is aware.//ah

## 2018-11-28 ENCOUNTER — Other Ambulatory Visit: Payer: Self-pay

## 2018-11-28 DIAGNOSIS — O10919 Unspecified pre-existing hypertension complicating pregnancy, unspecified trimester: Secondary | ICD-10-CM

## 2018-11-28 MED ORDER — LABETALOL HCL 300 MG PO TABS: 300.0000 mg | ORAL_TABLET | Freq: Two times a day (BID) | ORAL | 1 refills | Status: DC

## 2018-12-01 ENCOUNTER — Other Ambulatory Visit: Payer: Self-pay

## 2018-12-01 DIAGNOSIS — O10919 Unspecified pre-existing hypertension complicating pregnancy, unspecified trimester: Secondary | ICD-10-CM

## 2018-12-01 MED ORDER — LABETALOL HCL 300 MG PO TABS: 300.0000 mg | ORAL_TABLET | Freq: Two times a day (BID) | ORAL | 1 refills | Status: DC

## 2018-12-03 ENCOUNTER — Other Ambulatory Visit: Payer: Self-pay | Admitting: Cardiology

## 2019-05-18 ENCOUNTER — Encounter: Payer: Self-pay | Admitting: Cardiology

## 2019-05-18 ENCOUNTER — Other Ambulatory Visit: Payer: Self-pay

## 2019-05-18 ENCOUNTER — Ambulatory Visit: Payer: BC Managed Care – PPO | Admitting: Cardiology

## 2019-05-18 VITALS — BP 129/93 | HR 77 | Ht 62.0 in | Wt 188.0 lb

## 2019-05-18 DIAGNOSIS — E782 Mixed hyperlipidemia: Secondary | ICD-10-CM

## 2019-05-18 DIAGNOSIS — E7800 Pure hypercholesterolemia, unspecified: Secondary | ICD-10-CM | POA: Diagnosis not present

## 2019-05-18 DIAGNOSIS — I10 Essential (primary) hypertension: Secondary | ICD-10-CM

## 2019-05-18 DIAGNOSIS — E559 Vitamin D deficiency, unspecified: Secondary | ICD-10-CM | POA: Diagnosis not present

## 2019-05-18 DIAGNOSIS — Z8249 Family history of ischemic heart disease and other diseases of the circulatory system: Secondary | ICD-10-CM | POA: Diagnosis not present

## 2019-05-18 MED ORDER — BYSTOLIC 20 MG PO TABS: 1.0000 | ORAL_TABLET | Freq: Every day | ORAL | 1 refills | Status: DC

## 2019-05-18 MED ORDER — ROSUVASTATIN CALCIUM 20 MG PO TABS: 20.0000 mg | ORAL_TABLET | Freq: Every day | ORAL | 2 refills | Status: DC

## 2019-05-18 NOTE — Progress Notes (Signed)
Primary Physician/Referring:  Haywood Pao, MD  Patient ID: Elizabeth Yang, female    DOB: [birth year], 38 y.o.   MRN: [id number]  Chief Complaint  Patient presents with  . Hypertension   HPI:    Elizabeth Yang  is a 38 y.o. Caucasian female with mixed hyperlipdemia, mild obesity, hypertension diagnosed at age 38 years, well-controlled on valsartan HCT, discontinued due to pregnancy in 2015, also has strong family history of premature CAD on her mother's side. Her mother had an MI at age 37, she has 3 maternal uncles who developed heart disease in their 84s and 32s, and her maternal grandfather died at age 58 of an MI. Therapy for hyperlipidemia has been held due to patient lactating. I had seen her 1.5 years ago and she was pregnant.  She now has a 37 year old and a 64 year old child and is still breast feeding.   Her 1st pregnancy in 2014 had bad outcomes due to uncontrolled hypertension, PET, she lost her 1st child.   She is asymptomatic but has noticed BP to be uncontrolled again and also has gained weight.   Past Medical History:  Diagnosis Date  . Anxiety   . Asthma   . Headache   . History of kidney stones   . Hypertension   . Hypertension affecting pregnancy, antepartum 09/13/2016  . Vaginal Pap smear, abnormal   . Vitamin D deficiency    Past Surgical History:  Procedure Laterality Date  . CESAREAN SECTION N/A 09/27/2014   Procedure: CESAREAN SECTION;  Surgeon: Princess Bruins, MD;  Location: [facility];  Service: Obstetrics;  Laterality: N/A;  . CESAREAN SECTION N/A 09/13/2016   Procedure: Repeat CESAREAN SECTION;  Surgeon: Azucena Fallen, MD;  Location: [facility];  Service: Obstetrics;  Laterality: N/A;  EDD: 09/26/16   . CESAREAN SECTION N/A 04/16/2018   Procedure: Repeat CESAREAN SECTION;  Surgeon: Azucena Fallen, MD;  Location: [facility];  Service: Obstetrics;  Laterality: N/A;  EDD: 04/23/18  . TONSILLECTOMY    . WISDOM TOOTH EXTRACTION      Social History   Socioeconomic History  . Marital status: Married    Spouse name: Not on file  . Number of children: 3  . Years of education: Not on file  . Highest education level: Not on file  Occupational History  . Not on file  Tobacco Use  . Smoking status: Never Smoker  . Smokeless tobacco: Never Used  Substance and Sexual Activity  . Alcohol use: No  . Drug use: No  . Sexual activity: Yes    Birth control/protection: None  Other Topics Concern  . Not on file  Social History Narrative  . Not on file   Social Determinants of Health   Financial Resource Strain:   . Difficulty of Paying Living Expenses: Not on file  Food Insecurity:   . Worried About Charity fundraiser in the Last Year: Not on file  . Ran Out of Food in the Last Year: Not on file  Transportation Needs:   . Lack of Transportation (Medical): Not on file  . Lack of Transportation (Non-Medical): Not on file  Physical Activity:   . Days of Exercise per Week: Not on file  . Minutes of Exercise per Session: Not on file  Stress:   . Feeling of Stress : Not on file  Social Connections:   . Frequency of Communication with Friends and Family: Not on file  . Frequency of  Social Gatherings with Friends and Family: Not on file  . Attends Religious Services: Not on file  . Active Member of Clubs or Organizations: Not on file  . Attends Archivist Meetings: Not on file  . Marital Status: Not on file  Intimate Partner Violence:   . Fear of Current or Ex-Partner: Not on file  . Emotionally Abused: Not on file  . Physically Abused: Not on file  . Sexually Abused: Not on file   ROS  Review of Systems  Constitution: Positive for weight gain. Negative for chills, decreased appetite and malaise/fatigue.  Cardiovascular: Negative for dyspnea on exertion, leg swelling and syncope.  Endocrine: Negative for cold intolerance.  Hematologic/Lymphatic: Does not bruise/bleed easily.  Musculoskeletal:  Negative for joint swelling.  Gastrointestinal: Negative for abdominal pain, anorexia, change in bowel habit, hematochezia and melena.  Neurological: Negative for headaches and light-headedness.  Psychiatric/Behavioral: Negative for depression and substance abuse.  All other systems reviewed and are negative.  Objective  Blood pressure (!) 129/93, pulse 77, height _0  (1.575 m), weight 188 lb (85.3 kg), SpO2 99 %, unknown if currently breastfeeding.  Vitals with BMI 05/18/2019 04/20/2018 04/20/2018  Height _1  - -  Weight 188 lbs - -  BMI 48.54 - -  Systolic 627 035 009  Diastolic 93 85 81  Pulse 77 67 64      Physical Exam  Constitutional:  She is moderately built and moderately obese in no acute distress.  HENT:  Head: Atraumatic.  Eyes: Conjunctivae are normal.  Neck: No JVD present. No thyromegaly present.  Cardiovascular: Normal rate, regular rhythm, normal heart sounds and intact distal pulses. Exam reveals no gallop.  No murmur heard. No leg edema, no JVD.  Pulmonary/Chest: Effort normal and breath sounds normal.  Abdominal: Soft. Bowel sounds are normal.  Musculoskeletal:        General: Normal range of motion.     Cervical back: Neck supple.  Neurological: She is alert.  Skin: Skin is warm and dry.  Psychiatric: She has a normal mood and affect.   Laboratory examination:   No results for input(s): NA, K, CL, CO2, GLUCOSE, BUN, CREATININE, CALCIUM, GFRNONAA, GFRAA in the last 8760 hours. CrCl cannot be calculated (Patient's most recent lab result is older than the maximum 21 days allowed.).  CMP Latest Ref Rng & Units 04/20/2018 04/14/2018 09/12/2016  Glucose 70 - 99 mg/dL 86 89 85  BUN 6 - 20 mg/dL _2 Creatinine 0.44 - 1.00 mg/dL 0.78 0.74 0.64  Sodium 135 - 145 mmol/L 137 133(L) 135  Potassium 3.5 - 5.1 mmol/L 3.8 4.2 4.3  Chloride 98 - 111 mmol/L 107 107 107  CO2 22 - 32 mmol/L 21(L) 19(L) 21(L)  Calcium 8.9 - 10.3 mg/dL 8.8(L) 8.4(L) 8.7(L)   Total Protein 6.5 - 8.1 g/dL 6.6 6.4(L) 6.9  Total Bilirubin 0.3 - 1.2 mg/dL 0.4 0.4 0.6  Alkaline Phos 38 - 126 U/L 106 135(H) 114  AST 15 - 41 U/L 23 14(L) 15  ALT 0 - 44 U/L 59(H) 18 23   CBC Latest Ref Rng & Units 04/20/2018 04/17/2018 04/14/2018  WBC 4.0 - 10.5 K/uL 7.4 13.3(H) 9.0  Hemoglobin 12.0 - 15.0 g/dL 12.0 11.0(L) 12.4  Hematocrit 36.0 - 46.0 % 36.3 32.1(L) 37.3  Platelets 150 - 400 K/uL 201 155 166   Lipid Panel  No results found for: CHOL, TRIG, HDL, CHOLHDL, VLDL, LDLCALC, LDLDIRECT HEMOGLOBIN A1C No results found for: HGBA1C, MPG  TSH No results for input(s): TSH in the last 8760 hours.   Labs 12/31/2018: Serum glucose 100 g, BUN 17, creatinine 1.0, EGFR greater than 69, potassium 4.8.  Hb 15.3/HCT 44.8, platelets 216.  Normal indicis.   Total cholesterol 285, triglycerides 218, HDL 43, LDL 198.  Non-HDL cholesterol 242.  06/03/2015: Total cholesterol 288, triglycerides 245, HDL 62, LDL 177, serum glucose 107, creatinine 1.0, potassium 4.5, CMP otherwise normal.  Medications and allergies  No Known Allergies   Current Outpatient Medications  Medication Instructions  . albuterol (PROVENTIL HFA;VENTOLIN HFA) 108 (90 BASE) MCG/ACT inhaler 2 puffs, Inhalation, Every 6 hours PRN  . amLODipine (NORVASC) 10 MG tablet TAKE 1 TABLET BY MOUTH EVERYDAY AT BEDTIME  . butalbital-acetaminophen-caffeine (FIORICET, ESGIC) 50-325-40 MG tablet 1 tablet, Oral, 2 times daily PRN  . Bystolic 20 mg, Oral, Daily  . rosuvastatin (CRESTOR) 20 mg, Oral, Daily   Radiology:  No results found.  Cardiac Studies:   Echocardiogram  [07/27/2015]:  Left ventricle cavity is normal in size. Normal global wall motion. Normal diastolic filling pattern. Calculated EF 61%. Trace mitral regurgitation. Trace tricuspid regurgitation. Unable to estimate PA pressure due to absence/minimal TR signal. Normal echocardiogram.  Treadmill stress test  [08/01/2015]:  Indication: CP, Shortness of  breath The patient exercised according to Bruce Protocol, Total time recorded 9:00 min achieving max heart rate of 164 which was 88% of MPHR for age and 10.16 METS of work. Normal BP response. Resting ECG showing NSR. There was no ST-T changes of ischemia with exercise stress test. No arrythmias noted. Stress terminated due to shortness of breath and THR >85% met. Excellent effort. Normal exercise stress test. Normal BP response.  Assessment     ICD-10-CM   1. Essential hypertension  I10 EKG 12-Lead    TSH    Nebivolol HCl (BYSTOLIC) 20 MG TABS    DISCONTINUED: Nebivolol HCl (BYSTOLIC) 20 MG TABS    DISCONTINUED: Nebivolol HCl (BYSTOLIC) 20 MG TABS  2. Hypercholesteremia  E78.00 rosuvastatin (CRESTOR) 20 MG tablet    LDL cholesterol, direct    Lipid Panel With LDL/HDL Ratio    Lipid Panel With LDL/HDL Ratio    LDL cholesterol, direct  3. Hypovitaminosis D  E55.9 Vitamin D 1,25 dihydroxy  4. Family history of premature CAD  Z15.49    Mom  MI at age 64, 3 maternal uncles and grandfather (MI at 65) CAD at early age.    EKG 05/18/2019: Normal sinus rhythm at rate of 75 bpm, normal axis.  No evidence of ischemia, normal EKG.  No significant change from 12/09/2017.  Recommendations:   Meds ordered this encounter  Medications  . DISCONTD: Nebivolol HCl (BYSTOLIC) 20 MG TABS    Sig: Take 1 tablet (20 mg total) by mouth daily.    Dispense:  90 tablet    Refill:  1  . rosuvastatin (CRESTOR) 20 MG tablet    Sig: Take 1 tablet (20 mg total) by mouth daily.    Dispense:  30 tablet    Refill:  2  . DISCONTD: Nebivolol HCl (BYSTOLIC) 20 MG TABS    Sig: Take 1 tablet (20 mg total) by mouth daily.    Dispense:  90 tablet    Refill:  1  . Nebivolol HCl (BYSTOLIC) 20 MG TABS    Sig: Take 1 tablet (20 mg total) by mouth daily.    Dispense:  90 tablet    Refill:  2    Elizabeth Yang  is a  38 y.o.  Caucasian female with mixed hyperlipdemia, mild obesity, hypertension, strong family history of  premature CAD on her mother's side. Therapy for hyperlipidemia has been held due to patient lactating and previously pregnant. I had seen her 1.5 years ago and she was pregnant. She now has a 37 year old and a 53 year old child and is still breast feeding.   Patient's blood pressure is uncontrolled, unfortunately has also gained weight again and no symptoms but has noticed blood pressure to be high.  I have discontinued labetalol and switched her to Bystolic 20 mg daily, we discussed regarding family history of premature coronary artery disease, mixed hyperlipidemia, she needs to be on statin therapy as well.  We'll start her on Crestor 20 mg daily.  Suspect she will need additional Zetia.  She is only breast feeding at night, is aware of the interaction and contraindication for use of statins and also beta blockers in light stating woman, states that she plans to quit breast feeding as of today.  She'll obtain baseline lipid panel today, also vitamin D levels TSH as she was also noted to be deficient in vitamin D.  I'll also repeat these tests and 6-8 weeks and see her back then.  Adrian Prows, MD, [facility] 05/20/2019, 6:25 AM [facility] [place] Pager: [phone] Office: [phone]

## 2019-05-20 MED ORDER — BYSTOLIC 20 MG PO TABS: 1.0000 | ORAL_TABLET | Freq: Every day | ORAL | 2 refills | Status: DC

## 2019-05-24 ENCOUNTER — Other Ambulatory Visit: Payer: Self-pay | Admitting: Cardiology

## 2019-05-24 DIAGNOSIS — E7800 Pure hypercholesterolemia, unspecified: Secondary | ICD-10-CM

## 2019-05-24 LAB — VITAMIN D 1,25 DIHYDROXY
Vitamin D 1, 25 (OH)2 Total: 53 pg/mL
Vitamin D2 1, 25 (OH)2: <10 pg/mL
Vitamin D3 1, 25 (OH)2: 48 pg/mL

## 2019-05-24 LAB — LIPID PANEL WITH LDL/HDL RATIO
Cholesterol, Total: 272 mg/dL — ABNORMAL HIGH (ref 100–199)
HDL: 52 mg/dL (ref >39)
LDL Chol Calc (NIH): 192 mg/dL — ABNORMAL HIGH (ref 0–99)
LDL/HDL Ratio: 3.7 ratio — ABNORMAL HIGH (ref 0.0–3.2)
Triglycerides: 151 mg/dL — ABNORMAL HIGH (ref 0–149)
VLDL Cholesterol Cal: 28 mg/dL (ref 5–40)

## 2019-05-24 LAB — TSH: TSH: 1.32 u[IU]/mL (ref 0.450–4.500)

## 2019-05-24 LAB — LDL CHOLESTEROL, DIRECT: LDL Direct: 206 mg/dL — ABNORMAL HIGH (ref 0–99)

## 2019-06-09 ENCOUNTER — Other Ambulatory Visit: Payer: Self-pay | Admitting: Cardiology

## 2019-06-09 DIAGNOSIS — E7800 Pure hypercholesterolemia, unspecified: Secondary | ICD-10-CM

## 2019-06-24 ENCOUNTER — Telehealth: Payer: Self-pay

## 2019-06-24 NOTE — Telephone Encounter (Signed)
Yes

## 2019-06-24 NOTE — Telephone Encounter (Signed)
Can she get labs done on 06/30/18 instead of the 06/28/18?

## 2019-06-25 NOTE — Telephone Encounter (Signed)
LMOVM . She can have labs drawn on the 27th.

## 2019-06-29 ENCOUNTER — Other Ambulatory Visit: Payer: Self-pay | Admitting: Cardiology

## 2019-07-02 LAB — LIPID PANEL WITH LDL/HDL RATIO
Cholesterol, Total: 115 mg/dL (ref 100–199)
HDL: 41 mg/dL (ref >39)
LDL Chol Calc (NIH): 59 mg/dL (ref 0–99)
LDL/HDL Ratio: 1.4 ratio (ref 0.0–3.2)
Triglycerides: 71 mg/dL (ref 0–149)
VLDL Cholesterol Cal: 15 mg/dL (ref 5–40)

## 2019-07-02 LAB — LDL CHOLESTEROL, DIRECT: LDL Direct: 63 mg/dL (ref 0–99)

## 2019-07-02 NOTE — Progress Notes (Signed)
Ref Range & Units            1 d ago    1 mo ago Cholesterol, Total 100 - 199 mg/dL 820      [id number]   Triglycerides 0 - 149 mg/dL              71      [id number]   HDL >39 mg/dL                          41       52  VLDL Cholesterol Cal 5 - 40 mg/dL  15        28  LDL Chol Calc (NIH) 0 - 99 mg/dL  59      [id number]   LDL/HDL Ratio 0.0 - 3.2 ratio  1.4      3.7High  CM   EXCELLENT!!! Good Job!!

## 2019-07-20 ENCOUNTER — Ambulatory Visit: Payer: Self-pay | Admitting: Cardiology

## 2019-07-31 ENCOUNTER — Other Ambulatory Visit: Payer: Self-pay

## 2019-07-31 ENCOUNTER — Emergency Department (HOSPITAL_COMMUNITY): Payer: BC Managed Care – PPO

## 2019-07-31 ENCOUNTER — Emergency Department (HOSPITAL_COMMUNITY)
Admission: EM | Admit: 2019-07-31 | Discharge: 2019-07-31 | Disposition: A | Discharge status: Home / Self Care | Payer: BC Managed Care – PPO | Attending: Emergency Medicine | Admitting: Emergency Medicine

## 2019-07-31 ENCOUNTER — Encounter (HOSPITAL_COMMUNITY): Payer: Self-pay | Admitting: Emergency Medicine

## 2019-07-31 DIAGNOSIS — Z79899 Other long term (current) drug therapy: Secondary | ICD-10-CM | POA: Insufficient documentation

## 2019-07-31 DIAGNOSIS — R519 Headache, unspecified: Secondary | ICD-10-CM

## 2019-07-31 DIAGNOSIS — R112 Nausea with vomiting, unspecified: Secondary | ICD-10-CM | POA: Diagnosis present

## 2019-07-31 DIAGNOSIS — I10 Essential (primary) hypertension: Secondary | ICD-10-CM | POA: Insufficient documentation

## 2019-07-31 DIAGNOSIS — H8110 Benign paroxysmal vertigo, unspecified ear: Secondary | ICD-10-CM | POA: Diagnosis not present

## 2019-07-31 DIAGNOSIS — R0789 Other chest pain: Secondary | ICD-10-CM

## 2019-07-31 DIAGNOSIS — J45909 Unspecified asthma, uncomplicated: Secondary | ICD-10-CM | POA: Diagnosis not present

## 2019-07-31 DIAGNOSIS — R5383 Other fatigue: Secondary | ICD-10-CM | POA: Diagnosis not present

## 2019-07-31 LAB — I-STAT BETA HCG BLOOD, ED (MC, WL, AP ONLY): I-stat hCG, quantitative: <5 m[IU]/mL (ref <5)

## 2019-07-31 LAB — HEPATIC FUNCTION PANEL
ALT: 17 U/L (ref 0–44)
AST: 9 U/L — ABNORMAL LOW (ref 15–41)
Albumin: 4.4 g/dL (ref 3.5–5.0)
Alkaline Phosphatase: 63 U/L (ref 38–126)
Bilirubin, Direct: <0.1 mg/dL (ref 0.0–0.2)
Total Bilirubin: 0.7 mg/dL (ref 0.3–1.2)
Total Protein: 7.7 g/dL (ref 6.5–8.1)

## 2019-07-31 LAB — CBC
HCT: 44.7 % (ref 36.0–46.0)
Hemoglobin: 15.4 g/dL — ABNORMAL HIGH (ref 12.0–15.0)
MCH: 30.1 pg (ref 26.0–34.0)
MCHC: 34.5 g/dL (ref 30.0–36.0)
MCV: 87.5 fL (ref 80.0–100.0)
Platelets: 223 10*3/uL (ref 150–400)
RBC: 5.11 MIL/uL (ref 3.87–5.11)
RDW: 11.8 % (ref 11.5–15.5)
WBC: 5.5 10*3/uL (ref 4.0–10.5)
nRBC: 0 % (ref 0.0–0.2)

## 2019-07-31 LAB — URINALYSIS, ROUTINE W REFLEX MICROSCOPIC
Bilirubin Urine: NEGATIVE
Glucose, UA: NEGATIVE mg/dL
Ketones, ur: NEGATIVE mg/dL
Leukocytes,Ua: NEGATIVE
Nitrite: NEGATIVE
Protein, ur: NEGATIVE mg/dL
Specific Gravity, Urine: 1.017 (ref 1.005–1.030)
pH: 5 (ref 5.0–8.0)

## 2019-07-31 LAB — BASIC METABOLIC PANEL
Anion gap: 9 (ref 5–15)
BUN: 15 mg/dL (ref 6–20)
CO2: 23 mmol/L (ref 22–32)
Calcium: 9.3 mg/dL (ref 8.9–10.3)
Chloride: 105 mmol/L (ref 98–111)
Creatinine, Ser: 0.87 mg/dL (ref 0.44–1.00)
GFR calc Af Amer: >60 mL/min (ref >60)
GFR calc non Af Amer: >60 mL/min (ref >60)
Glucose, Bld: 103 mg/dL — ABNORMAL HIGH (ref 70–99)
Potassium: 4.2 mmol/L (ref 3.5–5.1)
Sodium: 137 mmol/L (ref 135–145)

## 2019-07-31 LAB — LIPASE, BLOOD: Lipase: 27 U/L (ref 11–51)

## 2019-07-31 LAB — CBG MONITORING, ED: Glucose-Capillary: 87 mg/dL (ref 70–99)

## 2019-07-31 LAB — TROPONIN I (HIGH SENSITIVITY): Troponin I (High Sensitivity): <2 ng/L (ref <18)

## 2019-07-31 MED ORDER — DIPHENHYDRAMINE HCL 50 MG/ML IJ SOLN
25.0000 mg | Freq: Once | INTRAMUSCULAR | Status: AC
  Administered 2019-07-31: 19:00:00 25 mg via INTRAVENOUS
  Filled 2019-07-31: qty 1

## 2019-07-31 MED ORDER — ONDANSETRON HCL 4 MG PO TABS: 4.0000 mg | ORAL_TABLET | Freq: Four times a day (QID) | ORAL | 0 refills | Status: DC

## 2019-07-31 MED ORDER — DEXAMETHASONE SODIUM PHOSPHATE 10 MG/ML IJ SOLN
10.0000 mg | Freq: Once | INTRAMUSCULAR | Status: AC
  Administered 2019-07-31: 19:00:00 10 mg via INTRAVENOUS
  Filled 2019-07-31: qty 1

## 2019-07-31 MED ORDER — PROCHLORPERAZINE EDISYLATE 10 MG/2ML IJ SOLN
10.0000 mg | Freq: Once | INTRAMUSCULAR | Status: AC
  Administered 2019-07-31: 10 mg via INTRAVENOUS
  Filled 2019-07-31: qty 2

## 2019-07-31 MED ORDER — KETOROLAC TROMETHAMINE 15 MG/ML IJ SOLN
15.0000 mg | Freq: Once | INTRAMUSCULAR | Status: AC
  Administered 2019-07-31: 15 mg via INTRAVENOUS
  Filled 2019-07-31: qty 1

## 2019-07-31 MED ORDER — SODIUM CHLORIDE 0.9% FLUSH: 3.0000 mL | Freq: Once | INTRAVENOUS | Status: DC

## 2019-07-31 MED ORDER — SODIUM CHLORIDE 0.9 % IV BOLUS
1000.0000 mL | Freq: Once | INTRAVENOUS | Status: AC
  Administered 2019-07-31: 19:00:00 1000 mL via INTRAVENOUS

## 2019-07-31 MED ORDER — MECLIZINE HCL 25 MG PO TABS: 25.0000 mg | ORAL_TABLET | Freq: Three times a day (TID) | ORAL | 0 refills | Status: DC | PRN

## 2019-07-31 NOTE — ED Triage Notes (Signed)
Onset last week nausea, emesis, headache, fatigue, and dizziness. Took a covid test 2 days ago and was negative. Spoke with doctor sent to the ED for evaluation for possible dehydration.

## 2019-07-31 NOTE — ED Provider Notes (Signed)
Signout from PA [employer].  Patient presented with migraine consistent with previous migraines.  She also had positional dizziness, nausea and vomiting.  She has had COVID-19 testing outpatient which was negative. Blood work shows a slightly elevated hemoglobin, normal white count, normal LFTs, normal kidney function, no significant electrolyte abnormalities.  Urine not consistent with a UTI.  Lipase normal.    Troponin and chest x-ray pending.  Plan for reevaluation and if patient is feeling better, she may be discharged home with Epley maneuvers, meclizine and Zofran.   Physical Exam  BP (!) 128/94   Pulse 73   Temp 98.1 F (36.7 C) (Oral)   Resp (!) 22   Ht 5\' 2"  (1.575 m)   Wt 77.6 kg   LMP 07/31/2019   SpO2 100%   BMI 31.28 kg/m   Physical Exam   Patient resting comfortably in bed, no acute distress, nontoxic, non-lethargic.   ED Course/Procedures     Procedures  MDM  Patient presented with headache, dizziness, nausea and vomiting.  Blood work unremarkable.  Chest x-ray shows no pneumonia, pneumothorax, pleural effusion.  Troponin is negative.  Patient is feeling significantly better.  She denies any residual headache or dizziness.  History and physical not consistent with ACS.  Patient's migraine similar to prior.  Migraine not consistent with subarachnoid, meningitis.  Patient's dizziness consistent with peripheral vertigo.  She is ready and stable for discharge.   At this time there does not appear to be any evidence of an acute emergency medical condition and the patient appears stable for discharge with appropriate outpatient follow up.Diagnosis was discussed with patient who verbalizes understanding and is agreeable to discharge.      08/02/2019 08/01/19 1108    08/03/19, MD 08/03/19 1734

## 2019-07-31 NOTE — Discharge Instructions (Signed)
Follow-up with your primary care provider soon as possible for continued evaluation.  You may take meclizine as needed for dizziness.  Take Zofran as needed for nausea.  Return to the emergency room immediately for new or worsening symptoms or concerns, such as new or worsening chest pain, shortness of breath, fevers, vomiting, rashes or any concerns at all.

## 2019-07-31 NOTE — ED Provider Notes (Signed)
[facility] EMERGENCY DEPARTMENT Provider Note   CSN: [id number] Arrival date & time: 07/31/19  1334     History Chief Complaint  Patient presents with  . Emesis  . Nausea  . Headache  . Dizziness    Elizabeth Yang is a 39 y.o. female.  Elizabeth Yang is a 39 y.o. female with history of anxiety, migraines, hypertension, hyperlipidemia, asthma, who presents to the emergency department for 1 week of malaise, nausea, vomiting, headaches, fatigue and dizziness.  Patient states that she was seen in urgent care 2 days ago and had a Covid test completed that was negative.  She states that symptoms initially started last Thursday when she was feeling unwell and very fatigued started having a headache that felt like her usual migraine.  On Saturday she woke up very nauseated and have an episode of vomiting, nausea and decreased appetite has persisted throughout the week with a few episodes of vomiting.  She denies any associated abdominal pain, has had a few episodes of loose stools.  She states that she continued to have a headache throughout the week that waxes and wanes in intensity.  She denies any associated vision changes, fevers or chills, neck pain or stiffness.  She does report dizziness that is worse with position changes, she states when she sitting still she does not feel dizzy at all.  She describes it as a room spinning sensation, and occasionally feels lightheaded like she may pass out.  Her headache has not suddenly worsened.  She has not had any associated numbness weakness or tingling.  States she is taken a few doses of Fioricet for her headache without improvement.  Also reports Monday she has had some chest tightness, this is not associated with any shortness of breath or cough.  Pain is nonradiating.  She has had similar pains before that they thought were related to anxiety.  She is followed by Dr. Wylene Simmer with cardiology due to her strong family history of  early MI.  No other aggravating or alleviating factors.  The history is provided by the patient.       Past Medical History:  Diagnosis Date  . Anxiety   . Asthma   . Headache   . History of kidney stones   . Hypertension   . Hypertension affecting pregnancy, antepartum 09/13/2016  . Vaginal Pap smear, abnormal   . Vitamin D deficiency     Patient Active Problem List   Diagnosis Date Noted  . Chronic hypertension affecting pregnancy 04/16/2018  . Postpartum care following cesarean delivery (11/13) 04/16/2018  . Cesarean delivery delivered 09/13/2016    Past Surgical History:  Procedure Laterality Date  . CESAREAN SECTION N/A 09/27/2014   Procedure: CESAREAN SECTION;  Surgeon: Genia Del, MD;  Location: [facility];  Service: Obstetrics;  Laterality: N/A;  . CESAREAN SECTION N/A 09/13/2016   Procedure: Repeat CESAREAN SECTION;  Surgeon: Shea Evans, MD;  Location: [facility] BIRTHING SUITES;  Service: Obstetrics;  Laterality: N/A;  EDD: 09/26/16   . CESAREAN SECTION N/A 04/16/2018   Procedure: Repeat CESAREAN SECTION;  Surgeon: Shea Evans, MD;  Location: [facility] BIRTHING SUITES;  Service: Obstetrics;  Laterality: N/A;  EDD: 04/23/18  . TONSILLECTOMY    . WISDOM TOOTH EXTRACTION       OB History    Gravida  3   Para  3   Term  2   Preterm  1   AB  0   Living  3  SAB  0   TAB  0   Ectopic  0   Multiple  0   Live Births  3           Family History  Problem Relation Age of Onset  . Heart disease Mother   . Hypertension Mother   . Heart attack Mother   . Kidney disease Sister   . Heart disease Maternal Aunt   . Heart disease Maternal Uncle   . Heart attack Maternal Uncle   . Cancer Maternal Grandmother   . Heart disease Maternal Grandfather   . Diabetes Paternal Grandmother     Social History   Tobacco Use  . Smoking status: Never Smoker  . Smokeless tobacco: Never Used  Substance Use Topics  . Alcohol use: No  . Drug use: No    Home  Medications Prior to Admission medications   Medication Sig Start Date End Date Taking? Authorizing Provider  albuterol (PROVENTIL HFA;VENTOLIN HFA) 108 (90 BASE) MCG/ACT inhaler Inhale 2 puffs into the lungs every 6 (six) hours as needed for wheezing or shortness of breath.    [provider]  amLODipine (NORVASC) 10 MG tablet TAKE 1 TABLET BY MOUTH EVERYDAY AT BEDTIME 06/29/19   Adrian Prows, MD  butalbital-acetaminophen-caffeine (FIORICET, ESGIC) [id number] MG tablet Take 1 tablet by mouth 2 (two) times daily as needed for migraine.    [provider]  Nebivolol HCl (BYSTOLIC) 20 MG TABS Take 1 tablet (20 mg total) by mouth daily. 05/20/19   Adrian Prows, MD  rosuvastatin (CRESTOR) 20 MG tablet TAKE 1 TABLET BY MOUTH EVERY DAY 06/09/19   Adrian Prows, MD    Allergies    Patient has no known allergies.  Review of Systems   Review of Systems  Constitutional: Positive for fatigue. Negative for chills and fever.  HENT: Negative for rhinorrhea and sore throat.   Eyes: Negative for visual disturbance.  Respiratory: Negative for cough.   Cardiovascular: Negative for chest pain.  Gastrointestinal: Positive for diarrhea, nausea and vomiting. Negative for abdominal pain.  Genitourinary: Negative for dysuria and frequency.  Musculoskeletal: Positive for myalgias. Negative for neck pain and neck stiffness.  Neurological: Positive for dizziness, light-headedness and headaches. Negative for seizures, syncope, facial asymmetry, speech difficulty, weakness and numbness.  Psychiatric/Behavioral: The patient is nervous/anxious.     Physical Exam Updated Vital Signs BP 114/82 (BP Location: Left Arm)   Pulse 68   Temp 98.1 F (36.7 C) (Oral)   Resp 17   Ht 5\' 2"  (1.575 m)   Wt 77.6 kg   LMP 07/31/2019   SpO2 100%   BMI 31.28 kg/m   Physical Exam Vitals and nursing note reviewed.  Constitutional:      General: She is not in acute distress.    Appearance: She is well-developed. She  is not ill-appearing or diaphoretic.     Comments: Patient appears anxious but is in no acute distress.  HENT:     Head: Normocephalic and atraumatic.     Mouth/Throat:     Mouth: Mucous membranes are moist.     Pharynx: Oropharynx is clear.  Eyes:     General:        Right eye: No discharge.        Left eye: No discharge.     Extraocular Movements: Extraocular movements intact.     Pupils: Pupils are equal, round, and reactive to light.     Comments: Some horizontal nystagmus noted.  Cardiovascular:  Rate and Rhythm: Normal rate and regular rhythm.     Heart sounds: Normal heart sounds. No murmur. No friction rub. No gallop.   Pulmonary:     Effort: Pulmonary effort is normal. No respiratory distress.     Breath sounds: Normal breath sounds. No wheezing or rales.     Comments: Respirations equal and unlabored, patient able to speak in full sentences, lungs clear to auscultation bilaterally Abdominal:     General: Bowel sounds are normal. There is no distension.     Palpations: Abdomen is soft. There is no mass.     Tenderness: There is no abdominal tenderness. There is no guarding.     Comments: Abdomen soft, nondistended, nontender to palpation in all quadrants without guarding or peritoneal signs  Musculoskeletal:        General: No deformity.     Cervical back: Normal range of motion and neck supple. No rigidity.  Skin:    General: Skin is warm and dry.     Capillary Refill: Capillary refill takes less than 2 seconds.  Neurological:     Mental Status: She is alert.     GCS: GCS eye subscore is 4. GCS verbal subscore is 5. GCS motor subscore is 6.     Coordination: Coordination normal.     Comments: Speech is clear, able to follow commands CN III-XII intact Normal strength in upper and lower extremities bilaterally including dorsiflexion and plantar flexion, strong and equal grip strength Sensation normal to light and sharp touch Moves extremities without ataxia,  coordination intact Normal finger to nose and rapid alternating movements No pronator drift  Psychiatric:        Mood and Affect: Mood is anxious.        Behavior: Behavior normal.     ED Results / Procedures / Treatments   Labs (all labs ordered are listed, but only abnormal results are displayed) Labs Reviewed  BASIC METABOLIC PANEL - Abnormal; Notable for the following components:      Result Value   Glucose, Bld 103 (*)    All other components within normal limits  CBC - Abnormal; Notable for the following components:   Hemoglobin 15.4 (*)    All other components within normal limits  URINALYSIS, ROUTINE W REFLEX MICROSCOPIC  I-STAT BETA HCG BLOOD, ED ([facility], [facility], [facility] ONLY)  CBG MONITORING, ED    EKG None  Radiology No results found.  Procedures Procedures (including critical care time)  Medications Ordered in ED Medications  sodium chloride flush (NS) 0.9 % injection 3 mL (has no administration in time range)  sodium chloride 0.9 % bolus 1,000 mL (1,000 mLs Intravenous New Bag/Given 07/31/19 1850)  prochlorperazine (COMPAZINE) injection 10 mg (10 mg Intravenous Given 07/31/19 1855)  diphenhydrAMINE (BENADRYL) injection 25 mg (25 mg Intravenous Given 07/31/19 1851)  ketorolac (TORADOL) 15 MG/ML injection 15 mg (15 mg Intravenous Given 07/31/19 1852)  dexamethasone (DECADRON) injection 10 mg (10 mg Intravenous Given 07/31/19 1857)    ED Course  I have reviewed the triage vital signs and the nursing notes.  Pertinent labs & imaging results that were available during my care of the patient were reviewed by me and considered in my medical decision making (see chart for details).    MDM Rules/Calculators/A&P                      39 year old female presents with a week of general malaise, intermittent nausea and vomiting, headache and positional vertigo.  On Monday she also began experiencing some chest tightness and has significant family history of heart disease.  Chest  pain overall sounds atypical, EKG is unremarkable will check troponin, abdominal labs ordered as well as chest x-ray.  Patient's vertigo is reproducible with head movements and not present at rest, she has no other focal neurologic deficits or cerebellar symptoms and this seems consistent with BPPV.  Will treat with headache cocktail.  Patient has been taking Fioricet for headaches and I think could be contributing to rebound headaches, will give dose of Decadron with headache cocktail to help combat this.  IV fluid bolus given as well.  Patient has no fever and no nuchal rigidity, I have no suspicion for meningitis.  Story is not concerning for ICH, SAH.   Lab work is reassuring, no leukocytosis, hemoglobin slightly elevated, likely in the setting of dehydration.  No significant electrolyte derangements, normal renal and liver function.  Negative pregnancy.  Normal lipase.  Awaiting urinalysis, troponin and chest x-ray.  EKG shows sinus rhythm without concerning changes.  At shift change care signed out to [facility] who will follow up on pending chest x-ray and troponin and reevaluate patient after medications.  As long as symptoms have improved and the rest of patient's work-up is benign feel she can be discharged home with meclizine and instructions for Epley maneuvers for her peripheral vertigo, Zofran for nausea and close follow-up with her PCP.  Final Clinical Impression(s) / ED Diagnoses Final diagnoses:  Non-intractable vomiting with nausea, unspecified vomiting type  Bad headache  Benign paroxysmal positional vertigo, unspecified laterality  Chest tightness  Fatigue, unspecified type    Rx / DC Orders ED Discharge Orders    None       Legrand Rams 07/31/19 1911    Gerhard Munch, MD 08/03/19 1734

## 2019-09-18 DIAGNOSIS — R519 Headache, unspecified: Secondary | ICD-10-CM | POA: Insufficient documentation

## 2019-09-25 ENCOUNTER — Other Ambulatory Visit: Payer: Self-pay | Admitting: Cardiology

## 2019-09-25 DIAGNOSIS — E7800 Pure hypercholesterolemia, unspecified: Secondary | ICD-10-CM

## 2019-09-28 NOTE — Progress Notes (Signed)
Primary Physician/Referring:  Haywood Pao, MD  Patient ID: Elizabeth Yang, female    DOB: 09-30-1980, 39 y.o.   MRN: [id number]  Chief Complaint  Patient presents with  . Hypertension  . Hyperlipidemia  . Follow-up    2 month   HPI:    Elizabeth Yang  is a 39 y.o. Caucasian female with mixed hyperlipdemia, mild obesity, hypertension diagnosed at age 29 years, also has strong family history of premature CAD, mother had an MI at age 20, she has 3 maternal uncles who developed heart disease in their 75s and 80s, and her maternal grandfather died at age 34 of an MI. Therapy for hyperlipidemia has been held due to patient lactating /child bearing.  Her 1st pregnancy in 2014 had bad outcomes due to uncontrolled hypertension, PET, she lost her 1st child.   I had seen her 3 months ago and started on Bystolic, amlodipine and also Crestor which she is tolerating.  She has noticed improvement in overall well being and also has started to lose weight and has had good control of blood pressure.  Underwent labs and presents for follow-up remains asymptomatic.  Past Medical History:  Diagnosis Date  . Anxiety   . Asthma   . Headache   . History of kidney stones   . Hypertension   . Hypertension affecting pregnancy, antepartum 09/13/2016  . Vaginal Pap smear, abnormal   . Vitamin D deficiency    Past Surgical History:  Procedure Laterality Date  . CESAREAN SECTION N/A 09/27/2014   Procedure: CESAREAN SECTION;  Surgeon: Princess Bruins, MD;  Location: [place] [facility];  Service: Obstetrics;  Laterality: N/A;  . CESAREAN SECTION N/A 09/13/2016   Procedure: Repeat CESAREAN SECTION;  Surgeon: Azucena Fallen, MD;  Location: [place];  Service: Obstetrics;  Laterality: N/A;  EDD: 09/26/16   . CESAREAN SECTION N/A 04/16/2018   Procedure: Repeat CESAREAN SECTION;  Surgeon: Azucena Fallen, MD;  Location: [place];  Service: Obstetrics;  Laterality: N/A;  EDD: 04/23/18  .  TONSILLECTOMY    . WISDOM TOOTH EXTRACTION     Family History  Problem Relation Age of Onset  . Heart disease Mother   . Hypertension Mother   . Heart attack Mother   . Kidney disease Sister   . Heart disease Maternal Aunt   . Heart disease Maternal Uncle   . Heart attack Maternal Uncle   . Cancer Maternal Grandmother   . Heart disease Maternal Grandfather   . Diabetes Paternal Grandmother     Social History   Tobacco Use  . Smoking status: Never Smoker  . Smokeless tobacco: Never Used  Substance Use Topics  . Alcohol use: No   Marital Status: Married  ROS  Review of Systems  Cardiovascular: Negative for chest pain, dyspnea on exertion and leg swelling.  Gastrointestinal: Negative for melena.   Objective  Blood pressure 131/76, pulse 66, temperature (!) 97.2 F (36.2 C), temperature source Temporal, resp. rate 16, height '5\' 2"'  (1.575 m), weight 170 lb (77.1 kg), SpO2 100 %, unknown if currently breastfeeding.  Vitals with BMI 09/29/2019 07/31/2019 07/31/2019  Height '5\' 2"'  - -  Weight 170 lbs - -  BMI 40.98 - -  Systolic 119 147 829  Diastolic 76 88 94  Pulse 66 - 73    05/18/2019  '5\' 2"'   188 lbs  34.38  129  93  77    Physical Exam  Constitutional: She appears well-developed.  Mildly obese  Cardiovascular: Normal rate, regular rhythm, normal heart sounds and intact distal pulses. Exam reveals no gallop.  No murmur heard. No leg edema, no JVD.  Pulmonary/Chest: Effort normal and breath sounds normal.  Abdominal: Soft. Bowel sounds are normal.   Laboratory examination:   Recent Labs    07/31/19 1449  NA 137  K 4.2  CL 105  CO2 23  GLUCOSE 103*  BUN 15  CREATININE 0.87  CALCIUM 9.3  GFRNONAA >60  GFRAA >60   CrCl cannot be calculated (Patient's most recent lab result is older than the maximum 21 days allowed.).  CMP Latest Ref Rng & Units 07/31/2019 04/20/2018 04/14/2018  Glucose 70 - 99 mg/dL 103(H) 86 89  BUN 6 - 20 mg/dL '15 19 16  ' Creatinine  0.44 - 1.00 mg/dL 0.87 0.78 0.74  Sodium 135 - 145 mmol/L 137 137 133(L)  Potassium 3.5 - 5.1 mmol/L 4.2 3.8 4.2  Chloride 98 - 111 mmol/L 105 107 107  CO2 22 - 32 mmol/L 23 21(L) 19(L)  Calcium 8.9 - 10.3 mg/dL 9.3 8.8(L) 8.4(L)  Total Protein 6.5 - 8.1 g/dL 7.7 6.6 6.4(L)  Total Bilirubin 0.3 - 1.2 mg/dL 0.7 0.4 0.4  Alkaline Phos 38 - 126 U/L 63 106 135(H)  AST 15 - 41 U/L 9(L) 23 14(L)  ALT 0 - 44 U/L 17 59(H) 18   CBC Latest Ref Rng & Units 07/31/2019 04/20/2018 04/17/2018  WBC 4.0 - 10.5 K/uL 5.5 7.4 13.3(H)  Hemoglobin 12.0 - 15.0 g/dL 15.4(H) 12.0 11.0(L)  Hematocrit 36.0 - 46.0 % 44.7 36.3 32.1(L)  Platelets 150 - 400 K/uL 223 201 155    Lipid Panel     Component Value Date/Time   CHOL 115 07/01/2019 0845   TRIG 71 07/01/2019 0845   HDL 41 07/01/2019 0845   LDLCALC 59 07/01/2019 0845   LDLDIRECT 63 07/01/2019 0844   HEMOGLOBIN A1C No results found for: HGBA1C, MPG TSH Recent Labs    05/18/19 1512  TSH 1.320   Ref Range & Units 05/18/2019   Vitamin D 1, 25 (OH)2 Total pg/mL 53    External labs:   12/31/2018: Serum glucose 100 g, BUN 17, creatinine 1.0, EGFR greater than 69, potassium 4.8.  Hb 15.3/HCT 44.8, platelets 216.  Normal indicis.   Total cholesterol 285, triglycerides 218, HDL 43, LDL 198.  Non-HDL cholesterol 242.  06/03/2015: Total cholesterol 288, triglycerides 245, HDL 62, LDL 177, serum glucose 107, creatinine 1.0, potassium 4.5, CMP otherwise normal.  Medications and allergies  No Known Allergies   Current Outpatient Medications  Medication Instructions  . albuterol (PROVENTIL HFA;VENTOLIN HFA) 108 (90 BASE) MCG/ACT inhaler 2 puffs, Inhalation, Every 6 hours PRN  . amLODipine (NORVASC) 10 MG tablet TAKE 1 TABLET BY MOUTH EVERYDAY AT BEDTIME  . butalbital-acetaminophen-caffeine (FIORICET, ESGIC) 50-325-40 MG tablet 1 tablet, Oral, 2 times daily PRN  . Bystolic 20 mg, Oral, Daily  . clonazePAM (KLONOPIN) 1 MG tablet Oral  . norethindrone  (MICRONOR) 0.35 MG tablet 1 tablet, Oral, Daily  . phentermine (ADIPEX-P) 37.5 mg, Oral, Daily  . rosuvastatin (CRESTOR) 20 MG tablet TAKE 1 TABLET BY MOUTH EVERY DAY   Radiology:   No results found.  Cardiac Studies:   Echocardiogram  [07/27/2015]:  Left ventricle cavity is normal in size. Normal global wall motion. Normal diastolic filling pattern. Calculated EF 61%. Trace mitral regurgitation. Trace tricuspid regurgitation. Unable to estimate PA pressure due to absence/minimal TR signal. Normal echocardiogram.  Treadmill stress test [08/01/2015]:  Indication:  CP, Shortness of breath The patient exercised according to Bruce Protocol, Total time recorded 9:00 min achieving max heart rate of 164 which was 88% of MPHR for age and 10.16 METS of work. Normal BP response. Resting ECG showing NSR. There was no ST-T changes of ischemia with exercise stress test. No arrythmias noted. Stress terminated due to shortness of breath and THR >85% met. Excellent effort. Normal exercise stress test. Normal BP response.  EKG   07/31/2019: SR.  05/18/2019: Normal sinus rhythm at rate of 75 bpm, normal axis.  No evidence of ischemia, normal EKG.  No significant change from 12/09/2017.  Assessment     ICD-10-CM   1. Essential hypertension  I10   2. Hypercholesteremia  E78.00   3. Hypovitaminosis D  E55.9   4. Family history of premature CAD  Z82.49      No orders of the defined types were placed in this encounter.   Medications Discontinued During This Encounter  Medication Reason  . meclizine (ANTIVERT) 25 MG tablet No longer needed (for PRN medications)  . ondansetron (ZOFRAN) 4 MG tablet No longer needed (for PRN medications)    Recommendations:   Elizabeth Yang  is a  40 y.o. Caucasian female with mixed hyperlipdemia, mild obesity, hypertension diagnosed at age 71 years, also has strong family history of premature CAD, mother had an MI at age 38, she has 3 maternal uncles who  developed heart disease in their 72s and 41s, and her maternal grandfather died at age 69 of an MI. Therapy for hyperlipidemia has been held due to patient lactating /child bearing.  Her 1st pregnancy in 2014 had bad outcomes due to uncontrolled hypertension, PET, she lost her 1st child.   Since patient is on amlodipine, Bystolic and also Crestor, blood pressure is under excellent control, she remains asymptomatic and tolerating the medications well.  I reviewed her labs, lipids are under excellent control as well.  No changes were done today.  As she remains stable from cardiac standpoint, I will see her back on a as needed basis, she will continue present medications.  Her LDL is improved from 177 to the present 63. She is also loosing weight and is doing her best to make lifestyle changes. Has lost 18 pounds in last 3 months. Vitamin D levels are normal.   I will personally perform the test and if I find abnormalities,  will perform further evaluation. Otherwise unless new on ongoing symptoms(patient advised to contact us), preventive  therapy is recommended. I will then see the patient on a PRN basis.   Adrian Prows, MD, [facility] 09/29/2019, 10:53 PM [facility] [place] Office: [phone]

## 2019-09-29 ENCOUNTER — Encounter: Payer: Self-pay | Admitting: Cardiology

## 2019-09-29 ENCOUNTER — Other Ambulatory Visit: Payer: Self-pay

## 2019-09-29 ENCOUNTER — Ambulatory Visit: Payer: BC Managed Care – PPO | Admitting: Cardiology

## 2019-09-29 VITALS — BP 131/76 | HR 66 | Temp 97.2°F | Resp 16 | Ht 62.0 in | Wt 170.0 lb

## 2019-09-29 DIAGNOSIS — I10 Essential (primary) hypertension: Secondary | ICD-10-CM

## 2019-09-29 DIAGNOSIS — E559 Vitamin D deficiency, unspecified: Secondary | ICD-10-CM

## 2019-09-29 DIAGNOSIS — E7800 Pure hypercholesterolemia, unspecified: Secondary | ICD-10-CM

## 2019-09-29 DIAGNOSIS — Z8249 Family history of ischemic heart disease and other diseases of the circulatory system: Secondary | ICD-10-CM

## 2019-10-02 DIAGNOSIS — E669 Obesity, unspecified: Secondary | ICD-10-CM | POA: Insufficient documentation

## 2019-10-21 ENCOUNTER — Ambulatory Visit: Payer: BC Managed Care – PPO | Admitting: Neurology

## 2019-10-23 ENCOUNTER — Telehealth: Payer: Self-pay

## 2019-10-24 NOTE — Telephone Encounter (Signed)
No message for me to review

## 2019-11-24 DIAGNOSIS — G4700 Insomnia, unspecified: Secondary | ICD-10-CM | POA: Insufficient documentation

## 2019-11-24 DIAGNOSIS — F411 Generalized anxiety disorder: Secondary | ICD-10-CM | POA: Insufficient documentation

## 2019-12-10 ENCOUNTER — Ambulatory Visit: Payer: BC Managed Care – PPO | Admitting: Neurology

## 2019-12-21 ENCOUNTER — Other Ambulatory Visit: Payer: Self-pay | Admitting: Obstetrics & Gynecology

## 2019-12-21 DIAGNOSIS — N63 Unspecified lump in unspecified breast: Secondary | ICD-10-CM

## 2019-12-24 ENCOUNTER — Ambulatory Visit
Admission: RE | Admit: 2019-12-24 | Discharge: 2019-12-24 | Disposition: A | Discharge status: Home / Self Care | Payer: BC Managed Care – PPO | Source: Ambulatory Visit | Attending: Obstetrics & Gynecology | Admitting: Obstetrics & Gynecology

## 2019-12-24 ENCOUNTER — Other Ambulatory Visit: Payer: Self-pay

## 2019-12-24 DIAGNOSIS — N63 Unspecified lump in unspecified breast: Secondary | ICD-10-CM

## 2020-01-29 DIAGNOSIS — F339 Major depressive disorder, recurrent, unspecified: Secondary | ICD-10-CM | POA: Insufficient documentation

## 2020-03-03 ENCOUNTER — Other Ambulatory Visit: Payer: Self-pay | Admitting: Cardiology

## 2020-03-03 DIAGNOSIS — E7800 Pure hypercholesterolemia, unspecified: Secondary | ICD-10-CM

## 2020-04-13 ENCOUNTER — Other Ambulatory Visit: Payer: Self-pay | Admitting: Cardiology

## 2020-04-27 DIAGNOSIS — F3281 Premenstrual dysphoric disorder: Secondary | ICD-10-CM | POA: Insufficient documentation

## 2020-06-05 ENCOUNTER — Other Ambulatory Visit: Payer: Self-pay | Admitting: Cardiology

## 2020-06-05 DIAGNOSIS — E7800 Pure hypercholesterolemia, unspecified: Secondary | ICD-10-CM

## 2020-06-10 ENCOUNTER — Telehealth: Payer: Self-pay

## 2020-06-10 NOTE — Telephone Encounter (Signed)
Please call pt to schedule an appointment. AD 

## 2020-06-10 NOTE — Telephone Encounter (Signed)
Please call pt to schedule an appointment. AD

## 2020-06-24 NOTE — Progress Notes (Deleted)
Primary Physician/Referring:  Haywood Pao, MD  Patient ID: Elizabeth Yang, female    DOB: Aug 07, 1980, 40 y.o.   MRN: [id number]  No chief complaint on file.  HPI:    Elizabeth Yang  is a 40 y.o. Caucasian female with mixed hyperlipdemia, mild obesity, hypertension diagnosed at age 40 years, also has strong family history of premature CAD, mother had an MI at age 59, she has 3 maternal uncles who developed heart disease in their 72s and 88s, and her maternal grandfather died at age 61 of an MI. Therapy for hyperlipidemia has been held due to patient lactating /child bearing.  Her 1st pregnancy in 2014 had bad outcomes due to uncontrolled hypertension, PET, she lost her 1st child.   I had seen her 3 months ago and started on Bystolic, amlodipine and also Crestor which she is tolerating.  She has noticed improvement in overall well being and also has started to lose weight and has had good control of blood pressure.  Underwent labs and presents for follow-up remains asymptomatic.  ***Patient was last seen in our office 09/29/19 by Dr. Einar Gip and was stable and therefore recommended for as needed follow up. She now presents for hypertension management and requesting refill of rosuvastatin.   Past Medical History:  Diagnosis Date  . Anxiety   . Asthma   . Headache   . History of kidney stones   . Hypertension   . Hypertension affecting pregnancy, antepartum 09/13/2016  . Vaginal Pap smear, abnormal   . Vitamin D deficiency    Past Surgical History:  Procedure Laterality Date  . CESAREAN SECTION N/A 09/27/2014   Procedure: CESAREAN SECTION;  Surgeon: Princess Bruins, MD;  Location: [facility];  Service: Obstetrics;  Laterality: N/A;  . CESAREAN SECTION N/A 09/13/2016   Procedure: Repeat CESAREAN SECTION;  Surgeon: Azucena Fallen, MD;  Location: [place];  Service: Obstetrics;  Laterality: N/A;  EDD: 09/26/16   . CESAREAN SECTION N/A 04/16/2018   Procedure: Repeat CESAREAN  SECTION;  Surgeon: Azucena Fallen, MD;  Location: [place];  Service: Obstetrics;  Laterality: N/A;  EDD: 04/23/18  . TONSILLECTOMY    . WISDOM TOOTH EXTRACTION     Family History  Problem Relation Age of Onset  . Heart disease Mother   . Hypertension Mother   . Heart attack Mother   . Kidney disease Sister   . Heart disease Maternal Aunt   . Heart disease Maternal Uncle   . Heart attack Maternal Uncle   . Cancer Maternal Grandmother   . Heart disease Maternal Grandfather   . Diabetes Paternal Grandmother     Social History   Tobacco Use  . Smoking status: Never Smoker  . Smokeless tobacco: Never Used  Substance Use Topics  . Alcohol use: No   Marital Status: Married  ROS  Review of Systems  Cardiovascular: Negative for chest pain, dyspnea on exertion and leg swelling.  Gastrointestinal: Negative for melena.   Objective   Vitals with BMI 09/29/2019 07/31/2019 07/31/2019  Height '5\' 2"'  - -  Weight 170 lbs - -  BMI 48.01 - -  Systolic 655 374 827  Diastolic 76 88 94  Pulse 66 - 73      Physical Exam Constitutional:      Appearance: She is well-developed.     Comments: Mildly obese  Cardiovascular:     Rate and Rhythm: Normal rate and regular rhythm.     Pulses: Intact distal pulses.  Heart sounds: Normal heart sounds. No murmur heard. No gallop.      Comments: No leg edema, no JVD. Pulmonary:     Effort: Pulmonary effort is normal.     Breath sounds: Normal breath sounds.  Abdominal:     General: Bowel sounds are normal.     Palpations: Abdomen is soft.    Laboratory examination:   Recent Labs    07/31/19 1449  NA 137  K 4.2  CL 105  CO2 23  GLUCOSE 103*  BUN 15  CREATININE 0.87  CALCIUM 9.3  GFRNONAA >60  GFRAA >60   CrCl cannot be calculated (Patient's most recent lab result is older than the maximum 21 days allowed.).  CMP Latest Ref Rng & Units 07/31/2019 04/20/2018 04/14/2018  Glucose 70 - 99 mg/dL 103(H) 86 89  BUN 6 - 20  mg/dL '15 19 16  ' Creatinine 0.44 - 1.00 mg/dL 0.87 0.78 0.74  Sodium 135 - 145 mmol/L 137 137 133(L)  Potassium 3.5 - 5.1 mmol/L 4.2 3.8 4.2  Chloride 98 - 111 mmol/L 105 107 107  CO2 22 - 32 mmol/L 23 21(L) 19(L)  Calcium 8.9 - 10.3 mg/dL 9.3 8.8(L) 8.4(L)  Total Protein 6.5 - 8.1 g/dL 7.7 6.6 6.4(L)  Total Bilirubin 0.3 - 1.2 mg/dL 0.7 0.4 0.4  Alkaline Phos 38 - 126 U/L 63 106 135(H)  AST 15 - 41 U/L 9(L) 23 14(L)  ALT 0 - 44 U/L 17 59(H) 18   CBC Latest Ref Rng & Units 07/31/2019 04/20/2018 04/17/2018  WBC 4.0 - 10.5 K/uL 5.5 7.4 13.3(H)  Hemoglobin 12.0 - 15.0 g/dL 15.4(H) 12.0 11.0(L)  Hematocrit 36.0 - 46.0 % 44.7 36.3 32.1(L)  Platelets 150 - 400 K/uL 223 201 155    Lipid Panel     Component Value Date/Time   CHOL 115 07/01/2019 0845   TRIG 71 07/01/2019 0845   HDL 41 07/01/2019 0845   LDLCALC 59 07/01/2019 0845   LDLDIRECT 63 07/01/2019 0844   HEMOGLOBIN A1C No results found for: HGBA1C, MPG TSH No results for input(s): TSH in the last 8760 hours. Ref Range & Units 05/18/2019   Vitamin D 1, 25 (OH)2 Total pg/mL 53    External labs:   12/31/2018: Serum glucose 100 g, BUN 17, creatinine 1.0, EGFR greater than 69, potassium 4.8.  Hb 15.3/HCT 44.8, platelets 216.  Normal indicis.   Total cholesterol 285, triglycerides 218, HDL 43, LDL 198.  Non-HDL cholesterol 242.  06/03/2015: Total cholesterol 288, triglycerides 245, HDL 62, LDL 177, serum glucose 107, creatinine 1.0, potassium 4.5, CMP otherwise normal.  Medications and allergies  No Known Allergies   Current Outpatient Medications  Medication Instructions  . albuterol (PROVENTIL HFA;VENTOLIN HFA) 108 (90 BASE) MCG/ACT inhaler 2 puffs, Inhalation, Every 6 hours PRN  . amLODipine (NORVASC) 10 MG tablet TAKE 1 TABLET BY MOUTH EVERYDAY AT BEDTIME  . butalbital-acetaminophen-caffeine (FIORICET, ESGIC) 50-325-40 MG tablet 1 tablet, Oral, 2 times daily PRN  . Bystolic 20 mg, Oral, Daily  . clonazePAM (KLONOPIN) 1  MG tablet Oral  . norethindrone (MICRONOR) 0.35 MG tablet 1 tablet, Oral, Daily  . phentermine (ADIPEX-P) 37.5 mg, Oral, Daily  . rosuvastatin (CRESTOR) 20 MG tablet TAKE 1 TABLET BY MOUTH EVERY DAY   Radiology:   No results found.  Cardiac Studies:   Echocardiogram  [07/27/2015]:  Left ventricle cavity is normal in size. Normal global wall motion. Normal diastolic filling pattern. Calculated EF 61%. Trace mitral regurgitation. Trace tricuspid regurgitation. Unable  to estimate PA pressure due to absence/minimal TR signal. Normal echocardiogram.  Treadmill stress test [08/01/2015]:  Indication: CP, Shortness of breath The patient exercised according to Bruce Protocol, Total time recorded 9:00 min achieving max heart rate of 164 which was 88% of MPHR for age and 10.16 METS of work. Normal BP response. Resting ECG showing NSR. There was no ST-T changes of ischemia with exercise stress test. No arrythmias noted. Stress terminated due to shortness of breath and THR >85% met. Excellent effort. Normal exercise stress test. Normal BP response.   EKG   ***   07/31/2019: SR.  05/18/2019: Normal sinus rhythm at rate of 75 bpm, normal axis.  No evidence of ischemia, normal EKG.  No significant change from 12/09/2017.  Assessment     ICD-10-CM   1. Hypercholesteremia  E78.00   2. Essential hypertension  I10   3. Family history of premature CAD  Z82.49      No orders of the defined types were placed in this encounter.   There are no discontinued medications.  Recommendations:   Elizabeth Yang  is a  40 y.o. Caucasian female with mixed hyperlipdemia, mild obesity, hypertension diagnosed at age 65 years, also has strong family history of premature CAD, mother had an MI at age 15, she has 3 maternal uncles who developed heart disease in their 23s and 42s, and her maternal grandfather died at age 48 of an MI. Therapy for hyperlipidemia has been held due to patient lactating /child  bearing.  Her 1st pregnancy in 2014 had bad outcomes due to uncontrolled hypertension, PET, she lost her 1st child.   Since patient is on amlodipine, Bystolic and also Crestor, blood pressure is under excellent control, she remains asymptomatic and tolerating the medications well.  I reviewed her labs, lipids are under excellent control as well.  No changes were done today.  As she remains stable from cardiac standpoint, I will see her back on a as needed basis, she will continue present medications.  Her LDL is improved from 177 to the present 63. She is also loosing weight and is doing her best to make lifestyle changes. Has lost 18 pounds in last 3 months. Vitamin D levels are normal.   I will personally perform the test and if I find abnormalities,  will perform further evaluation. Otherwise unless new on ongoing symptoms(patient advised to contact us), preventive  therapy is recommended. I will then see the patient on a PRN basis.   ***

## 2020-06-27 ENCOUNTER — Ambulatory Visit: Payer: BC Managed Care – PPO | Admitting: Student

## 2020-06-30 ENCOUNTER — Encounter: Payer: Self-pay | Admitting: Cardiology

## 2020-06-30 ENCOUNTER — Other Ambulatory Visit: Payer: Self-pay

## 2020-06-30 ENCOUNTER — Ambulatory Visit: Payer: BC Managed Care – PPO | Admitting: Cardiology

## 2020-06-30 VITALS — BP 127/94 | HR 77 | Temp 97.4°F | Resp 16 | Ht 62.0 in | Wt 139.4 lb

## 2020-06-30 DIAGNOSIS — I10 Essential (primary) hypertension: Secondary | ICD-10-CM

## 2020-06-30 DIAGNOSIS — E7800 Pure hypercholesterolemia, unspecified: Secondary | ICD-10-CM

## 2020-06-30 MED ORDER — METHYLDOPA 500 MG PO TABS: 500.0000 mg | ORAL_TABLET | Freq: Three times a day (TID) | ORAL | 2 refills | Status: DC

## 2020-06-30 NOTE — Progress Notes (Signed)
Primary Physician/Referring:  Jolinda Croak, MD  Patient ID: Elizabeth Yang, female    DOB: 1980/07/06, 40 y.o.   MRN: [id number]  Chief Complaint  Patient presents with  . Follow-up  . Hypertension   HPI:    Elizabeth Yang  is a 40 y.o. Caucasian female with mixed hyperlipdemia, mild obesity, hypertension diagnosed at age 31 years, also has strong family history of premature CAD, mother had an MI at age 62, she has 3 maternal uncles who developed heart disease in their 37s and 75s, and her maternal grandfather died at age 3 of an MI.  Presently on Bystolic, amlodipine and also Crestor which she is tolerating.  She was started on phentermine with significant weight loss, previously weight 188 pounds a year ago presently weighs 139 pounds.  She states that over the past few weeks her blood pressure has been elevated again.  Otherwise no other complaints.  She presently is a mother of 2 healthy kids after having lost the first 1 due to complications of pregnancy and hypertension, she now plans to have 1 more child and is thinking about this and is wondering if she needs to make changes to the medications.  Past Medical History:  Diagnosis Date  . Anxiety   . Asthma   . Headache   . History of kidney stones   . Hypertension   . Hypertension affecting pregnancy, antepartum 09/13/2016  . Vaginal Pap smear, abnormal   . Vitamin D deficiency    Past Surgical History:  Procedure Laterality Date  . CESAREAN SECTION N/A 09/27/2014   Procedure: CESAREAN SECTION;  Surgeon: Princess Bruins, MD;  Location: [facility];  Service: Obstetrics;  Laterality: N/A;  . CESAREAN SECTION N/A 09/13/2016   Procedure: Repeat CESAREAN SECTION;  Surgeon: Azucena Fallen, MD;  Location: [place];  Service: Obstetrics;  Laterality: N/A;  EDD: 09/26/16   . CESAREAN SECTION N/A 04/16/2018   Procedure: Repeat CESAREAN SECTION;  Surgeon: Azucena Fallen, MD;  Location: [place];  Service:  Obstetrics;  Laterality: N/A;  EDD: 04/23/18  . TONSILLECTOMY    . WISDOM TOOTH EXTRACTION     Family History  Problem Relation Age of Onset  . Heart disease Mother   . Hypertension Mother   . Heart attack Mother   . Kidney disease Sister   . Heart disease Maternal Aunt   . Heart disease Maternal Uncle   . Heart attack Maternal Uncle   . Cancer Maternal Grandmother   . Heart disease Maternal Grandfather   . Diabetes Paternal Grandmother     Social History   Tobacco Use  . Smoking status: Never Smoker  . Smokeless tobacco: Never Used  Substance Use Topics  . Alcohol use: Never   Marital Status: Married  ROS  Review of Systems  Cardiovascular: Negative for chest pain, dyspnea on exertion and leg swelling.  Gastrointestinal: Negative for melena.   Objective  Blood pressure (!) 127/94, pulse 77, temperature (!) 97.4 F (36.3 C), resp. rate 16, height '5\' 2"'  (1.575 m), weight 139 lb 6.4 oz (63.2 kg), SpO2 99 %, unknown if currently breastfeeding.  Vitals with BMI 06/30/2020 09/29/2019 07/31/2019  Height '5\' 2"'  '5\' 2"'  -  Weight 139 lbs 6 oz 170 lbs -  BMI 78.29 56.21 -  Systolic 308 657 846  Diastolic 94 76 88  Pulse 77 66 -    05/18/2019  '5\' 2"'   188 lbs  34.38  129  93  77  Physical Exam Constitutional:      Appearance: Normal appearance. She is well-developed and normal weight.  Cardiovascular:     Rate and Rhythm: Normal rate and regular rhythm.     Pulses: Intact distal pulses.     Heart sounds: Normal heart sounds. No murmur heard. No gallop.      Comments: No leg edema, no JVD. Pulmonary:     Effort: Pulmonary effort is normal.     Breath sounds: Normal breath sounds.  Abdominal:     General: Bowel sounds are normal.     Palpations: Abdomen is soft.    Laboratory examination:   Recent Labs    07/31/19 1449  NA 137  K 4.2  CL 105  CO2 23  GLUCOSE 103*  BUN 15  CREATININE 0.87  CALCIUM 9.3  GFRNONAA >60  GFRAA >60   CrCl cannot be  calculated (Patient's most recent lab result is older than the maximum 21 days allowed.).  CMP Latest Ref Rng & Units 07/31/2019 04/20/2018 04/14/2018  Glucose 70 - 99 mg/dL 103(H) 86 89  BUN 6 - 20 mg/dL '15 19 16  ' Creatinine 0.44 - 1.00 mg/dL 0.87 0.78 0.74  Sodium 135 - 145 mmol/L 137 137 133(L)  Potassium 3.5 - 5.1 mmol/L 4.2 3.8 4.2  Chloride 98 - 111 mmol/L 105 107 107  CO2 22 - 32 mmol/L 23 21(L) 19(L)  Calcium 8.9 - 10.3 mg/dL 9.3 8.8(L) 8.4(L)  Total Protein 6.5 - 8.1 g/dL 7.7 6.6 6.4(L)  Total Bilirubin 0.3 - 1.2 mg/dL 0.7 0.4 0.4  Alkaline Phos 38 - 126 U/L 63 106 135(H)  AST 15 - 41 U/L 9(L) 23 14(L)  ALT 0 - 44 U/L 17 59(H) 18   CBC Latest Ref Rng & Units 07/31/2019 04/20/2018 04/17/2018  WBC 4.0 - 10.5 K/uL 5.5 7.4 13.3(H)  Hemoglobin 12.0 - 15.0 g/dL 15.4(H) 12.0 11.0(L)  Hematocrit 36.0 - 46.0 % 44.7 36.3 32.1(L)  Platelets 150 - 400 K/uL 223 201 155    Lipid Panel     Component Value Date/Time   CHOL 115 07/01/2019 0845   TRIG 71 07/01/2019 0845   HDL 41 07/01/2019 0845   LDLCALC 59 07/01/2019 0845   LDLDIRECT 63 07/01/2019 0844   HEMOGLOBIN A1C No results found for: HGBA1C, MPG TSH No results for input(s): TSH in the last 8760 hours. Ref Range & Units 05/18/2019   Vitamin D 1, 25 (OH)2 Total pg/mL 53    External labs:   12/31/2018: Serum glucose 100 g, BUN 17, creatinine 1.0, EGFR greater than 69, potassium 4.8.  Hb 15.3/HCT 44.8, platelets 216.  Normal indicis.   Total cholesterol 285, triglycerides 218, HDL 43, LDL 198.  Non-HDL cholesterol 242.  06/03/2015: Total cholesterol 288, triglycerides 245, HDL 62, LDL 177, serum glucose 107, creatinine 1.0, potassium 4.5, CMP otherwise normal.  Medications and allergies  No Known Allergies  Current Outpatient Medications on File Prior to Visit  Medication Sig Dispense Refill  . albuterol (PROVENTIL HFA;VENTOLIN HFA) 108 (90 BASE) MCG/ACT inhaler Inhale 2 puffs into the lungs every 6 (six) hours as needed  for wheezing or shortness of breath.    Marland Kitchen amLODipine (NORVASC) 10 MG tablet TAKE 1 TABLET BY MOUTH EVERYDAY AT BEDTIME 90 tablet 2  . butalbital-acetaminophen-caffeine (FIORICET, ESGIC) 50-325-40 MG tablet Take 1 tablet by mouth 2 (two) times daily as needed for migraine.    . clonazePAM (KLONOPIN) 1 MG tablet Take by mouth.    . Nebivolol HCl (BYSTOLIC)  20 MG TABS Take 1 tablet (20 mg total) by mouth daily. 90 tablet 2  . norethindrone (MICRONOR) 0.35 MG tablet Take 1 tablet by mouth daily.    . phentermine (ADIPEX-P) 37.5 MG tablet Take 37.5 mg by mouth daily.    . rosuvastatin (CRESTOR) 20 MG tablet TAKE 1 TABLET BY MOUTH EVERY DAY 30 tablet 2  . sertraline (ZOLOFT) 100 MG tablet Take by mouth.    Marland Kitchen tiZANidine (ZANAFLEX) 4 MG tablet Take by mouth.    . WEGOVY 0.5 MG/0.5ML SOAJ inject 0.13m ONCE WEEKLY.     No current facility-administered medications on file prior to visit.    Radiology:   No results found.  Cardiac Studies:   Echocardiogram  [07/27/2015]:  Left ventricle cavity is normal in size. Normal global wall motion. Normal diastolic filling pattern. Calculated EF 61%. Trace mitral regurgitation. Trace tricuspid regurgitation. Unable to estimate PA pressure due to absence/minimal TR signal. Normal echocardiogram.  Treadmill stress test [08/01/2015]:  Indication: CP, Shortness of breath The patient exercised according to Bruce Protocol, Total time recorded 9:00 min achieving max heart rate of 164 which was 88% of MPHR for age and 10.16 METS of work. Normal BP response. Resting ECG showing NSR. There was no ST-T changes of ischemia with exercise stress test. No arrythmias noted. Stress terminated due to shortness of breath and THR >85% met. Excellent effort. Normal exercise stress test. Normal BP response.  EKG    EKG 06/30/2020: Normal sinus rhythm at rate of 74 bpm, normal axis.  No evident ischemia, normal EKG.   Assessment     ICD-10-CM   1. Hypercholesteremia   E78.00   2. Resistant hypertension  I10 EKG 12-Lead    methyldopa (ALDOMET) 500 MG tablet    PCV RENAL/RENAL ARTERY DUPLEX COMPLETE    Meds ordered this encounter  Medications  . methyldopa (ALDOMET) 500 MG tablet    Sig: Take 1 tablet (500 mg total) by mouth 3 (three) times daily.    Dispense:  90 tablet    Refill:  2    There are no discontinued medications.  Recommendations:   Elizabeth Yang is a  40y.o. Caucasian female with mixed hyperlipdemia, mild obesity, hypertension diagnosed at age [age]years, also has strong family history of premature CAD, mother had an MI at age 603 she has 3 maternal uncles who developed heart disease in their 31sand 430s and her maternal grandfather died at age 411of an MI. Therapy for hyperlipidemia has been held due to patient lactating /child bearing.  Her 1st pregnancy in 2014 had bad outcomes due to uncontrolled hypertension, PET, she lost her 1st child.   She was started on phentermine with significant weight loss, previously weight 188 pounds a year ago presently weighs 139 pounds.  Recently blood pressure has been uncontrolled.  She is also planning pregnancy again but is not sure.  Advised her that if she decides to get pregnant, to contact me, will probably discontinue Crestor from that and also consider changing Bystolic to labetalol.  For now for uncontrolled hypertension, I will add methyldopa 5 mg p.o. 3 times daily.  I would like to see her back in 4 weeks for follow-up, will obtain renal artery duplex to exclude renal artery stenosis as the blood pressure continues to remain elevated in spite of weight loss.  She is also not taking phentermine on a regular basis and advised her to wean it off.  Again discussed potential for weight  gain.   Adrian Prows, MD, [facility] 07/02/2020, 12:34 PM Office: [phone] Pager: [phone]

## 2020-07-11 DIAGNOSIS — M545 Low back pain, unspecified: Secondary | ICD-10-CM | POA: Insufficient documentation

## 2020-07-15 ENCOUNTER — Emergency Department (HOSPITAL_COMMUNITY)
Admission: EM | Admit: 2020-07-15 | Discharge: 2020-07-16 | Disposition: A | Discharge status: Home / Self Care | Payer: BC Managed Care – PPO | Attending: Emergency Medicine | Admitting: Emergency Medicine

## 2020-07-15 ENCOUNTER — Encounter (HOSPITAL_COMMUNITY): Payer: Self-pay

## 2020-07-15 ENCOUNTER — Other Ambulatory Visit: Payer: Self-pay

## 2020-07-15 DIAGNOSIS — K529 Noninfective gastroenteritis and colitis, unspecified: Secondary | ICD-10-CM | POA: Insufficient documentation

## 2020-07-15 DIAGNOSIS — R109 Unspecified abdominal pain: Secondary | ICD-10-CM | POA: Diagnosis present

## 2020-07-15 DIAGNOSIS — Z79899 Other long term (current) drug therapy: Secondary | ICD-10-CM | POA: Diagnosis not present

## 2020-07-15 DIAGNOSIS — J45909 Unspecified asthma, uncomplicated: Secondary | ICD-10-CM | POA: Diagnosis not present

## 2020-07-15 DIAGNOSIS — I10 Essential (primary) hypertension: Secondary | ICD-10-CM | POA: Diagnosis not present

## 2020-07-15 DIAGNOSIS — U071 COVID-19: Secondary | ICD-10-CM | POA: Diagnosis not present

## 2020-07-15 LAB — URINALYSIS, ROUTINE W REFLEX MICROSCOPIC
Bilirubin Urine: NEGATIVE
Glucose, UA: NEGATIVE mg/dL
Ketones, ur: NEGATIVE mg/dL
Leukocytes,Ua: NEGATIVE
Nitrite: NEGATIVE
Protein, ur: 30 mg/dL — AB
RBC / HPF: >50 RBC/hpf — ABNORMAL HIGH (ref 0–5)
Specific Gravity, Urine: 1.013 (ref 1.005–1.030)
pH: 6 (ref 5.0–8.0)

## 2020-07-15 LAB — COMPREHENSIVE METABOLIC PANEL
ALT: 15 U/L (ref 0–44)
AST: 8 U/L — ABNORMAL LOW (ref 15–41)
Albumin: 4.1 g/dL (ref 3.5–5.0)
Alkaline Phosphatase: 49 U/L (ref 38–126)
Anion gap: 9 (ref 5–15)
BUN: 14 mg/dL (ref 6–20)
CO2: 20 mmol/L — ABNORMAL LOW (ref 22–32)
Calcium: 9.1 mg/dL (ref 8.9–10.3)
Chloride: 108 mmol/L (ref 98–111)
Creatinine, Ser: 0.82 mg/dL (ref 0.44–1.00)
GFR, Estimated: >60 mL/min (ref >60)
Glucose, Bld: 111 mg/dL — ABNORMAL HIGH (ref 70–99)
Potassium: 3.9 mmol/L (ref 3.5–5.1)
Sodium: 137 mmol/L (ref 135–145)
Total Bilirubin: 0.9 mg/dL (ref 0.3–1.2)
Total Protein: 7.1 g/dL (ref 6.5–8.1)

## 2020-07-15 LAB — CBC
HCT: 43.1 % (ref 36.0–46.0)
Hemoglobin: 15 g/dL (ref 12.0–15.0)
MCH: 30.9 pg (ref 26.0–34.0)
MCHC: 34.8 g/dL (ref 30.0–36.0)
MCV: 88.9 fL (ref 80.0–100.0)
Platelets: 238 10*3/uL (ref 150–400)
RBC: 4.85 MIL/uL (ref 3.87–5.11)
RDW: 11.8 % (ref 11.5–15.5)
WBC: 12.5 10*3/uL — ABNORMAL HIGH (ref 4.0–10.5)
nRBC: 0 % (ref 0.0–0.2)

## 2020-07-15 LAB — I-STAT BETA HCG BLOOD, ED (MC, WL, AP ONLY): I-stat hCG, quantitative: <5 m[IU]/mL (ref <5)

## 2020-07-15 LAB — LIPASE, BLOOD: Lipase: 35 U/L (ref 11–51)

## 2020-07-15 MED ORDER — ONDANSETRON HCL 4 MG/2ML IJ SOLN
4.0000 mg | Freq: Once | INTRAMUSCULAR | Status: AC
  Administered 2020-07-16: 4 mg via INTRAVENOUS
  Filled 2020-07-15: qty 2

## 2020-07-15 MED ORDER — SODIUM CHLORIDE 0.9 % IV BOLUS
1000.0000 mL | Freq: Once | INTRAVENOUS | Status: AC
  Administered 2020-07-16: 1000 mL via INTRAVENOUS

## 2020-07-15 MED ORDER — MORPHINE SULFATE (PF) 4 MG/ML IV SOLN
4.0000 mg | Freq: Once | INTRAVENOUS | Status: AC
Start: 2020-07-15 — End: 2020-07-16
  Administered 2020-07-16: 4 mg via INTRAVENOUS
  Filled 2020-07-15: qty 1

## 2020-07-15 NOTE — ED Provider Notes (Addendum)
[facility] Provider Note   CSN: [id number] Arrival date & time: 07/15/20  1357     History Chief Complaint  Patient presents with  . Covid Positive  . Abdominal Pain  . Rectal Bleeding    Elizabeth BoschFrances L Decelles is a 40 y.o. female with a hx of asthma, hypertension, & anxiety who presents to the ED with complaints of abdominal pain x 2 weeks. Patient states she had had problems with left sided pain that occurs in the left back/flank and LLQ/suprapubic region, pain waxes/wanes- saw her OBGYN who did a pelvic exam that did not reveal GYN pathology per patient report, she was subsequently seen by orthopedics due to concern that pain may be related to sciatic nerve, she was started on a steroid pack for this which she started taking without much relief.  Over the weekend she did start to have nausea and vomiting, she subsequently tested positive for COVID-19.  She has been vomiting a few times per day.  This morning at 1 AM she woke up with significantly worsened pain to her left side with subsequent vomiting and diarrhea.  Had multiple episodes of each.  She subsequently started to have blood per rectum after multiple episodes of diarrhea and later today started to notice blood in her urine as well also having some dysuria/frequency.  She denies fever, syncope, chest pain, dyspnea, cough, melena, numbness, weakness, incontinence, saddle anesthesia, vaginal bleeding, history of IVDU, or history of cancer.  She spoke with her primary care provider who sent her to the emergency department as they felt she needed a CT scan per her report.   HPI     Past Medical History:  Diagnosis Date  . Anxiety   . Asthma   . Headache   . History of kidney stones   . Hypertension   . Hypertension affecting pregnancy, antepartum 09/13/2016  . Vaginal Pap smear, abnormal   . Vitamin D deficiency     Patient Active Problem List   Diagnosis Date Noted  . Chronic  hypertension affecting pregnancy 04/16/2018  . Postpartum care following cesarean delivery (11/13) 04/16/2018  . Cesarean delivery delivered 09/13/2016    Past Surgical History:  Procedure Laterality Date  . CESAREAN SECTION N/A 09/27/2014   Procedure: CESAREAN SECTION;  Surgeon: Genia DelMarie-Lyne Lavoie, MD;  Location: [facility];  Service: Obstetrics;  Laterality: N/A;  . CESAREAN SECTION N/A 09/13/2016   Procedure: Repeat CESAREAN SECTION;  Surgeon: Shea EvansVaishali Mody, MD;  [facility];  Service: Obstetrics;  Laterality: N/A;  EDD: 09/26/16   . CESAREAN SECTION N/A 04/16/2018   Procedure: Repeat CESAREAN SECTION;  Surgeon: Shea EvansMody, Vaishali, MD;  [facility];  Service: Obstetrics;  Laterality: N/A;  EDD: 04/23/18  . TONSILLECTOMY    . WISDOM TOOTH EXTRACTION       OB History    Gravida  3   Para  3   Term  2   Preterm  1   AB  0   Living  3     SAB  0   IAB  0   Ectopic  0   Multiple  0   Live Births  3           Family History  Problem Relation Age of Onset  . Heart disease Mother   . Hypertension Mother   . Heart attack Mother   . Kidney disease Sister   . Heart disease Maternal Aunt   . Heart disease  Maternal Uncle   . Heart attack Maternal Uncle   . Cancer Maternal Grandmother   . Heart disease Maternal Grandfather   . Diabetes Paternal Grandmother     Social History   Tobacco Use  . Smoking status: Never Smoker  . Smokeless tobacco: Never Used  Vaping Use  . Vaping Use: Never used  Substance Use Topics  . Alcohol use: Never  . Drug use: No    Home Medications Prior to Admission medications   Medication Sig Start Date End Date Taking? Authorizing Provider  albuterol (PROVENTIL HFA;VENTOLIN HFA) 108 (90 BASE) MCG/ACT inhaler Inhale 2 puffs into the lungs every 6 (six) hours as needed for wheezing or shortness of breath.    [provider]  amLODipine (NORVASC) 10 MG tablet TAKE 1 TABLET BY MOUTH EVERYDAY AT  BEDTIME 04/13/20   Yates Decamp, MD  butalbital-acetaminophen-caffeine (FIORICET, ESGIC) [phone] MG tablet Take 1 tablet by mouth 2 (two) times daily as needed for migraine.    [provider]  clonazePAM (KLONOPIN) 1 MG tablet Take by mouth.    [provider]  methyldopa (ALDOMET) 500 MG tablet Take 1 tablet (500 mg total) by mouth 3 (three) times daily. 06/30/20   Yates Decamp, MD  Nebivolol HCl (BYSTOLIC) 20 MG TABS Take 1 tablet (20 mg total) by mouth daily. 05/20/19   Yates Decamp, MD  norethindrone (MICRONOR) 0.35 MG tablet Take 1 tablet by mouth daily. 07/31/19   [provider]  phentermine (ADIPEX-P) 37.5 MG tablet Take 37.5 mg by mouth daily. 08/31/19   [provider]  rosuvastatin (CRESTOR) 20 MG tablet TAKE 1 TABLET BY MOUTH EVERY DAY 03/03/20   Yates Decamp, MD  sertraline (ZOLOFT) 100 MG tablet Take by mouth. 04/27/20   [provider]  tiZANidine (ZANAFLEX) 4 MG tablet Take by mouth. 06/27/20   [provider]  WEGOVY 0.5 MG/0.5ML SOAJ inject 0.5mg  ONCE WEEKLY. 02/12/20   [provider]    Allergies    Patient has no known allergies.  Review of Systems   Review of Systems  Constitutional: Negative for fever.  HENT: Positive for congestion.   Respiratory: Negative for cough and shortness of breath.   Cardiovascular: Negative for chest pain and leg swelling.  Gastrointestinal: Positive for abdominal pain, anal bleeding, diarrhea, nausea and vomiting. Negative for constipation.  Genitourinary: Positive for dysuria, flank pain, frequency and hematuria. Negative for vaginal bleeding and vaginal discharge.  Musculoskeletal: Positive for back pain.  Neurological: Negative for syncope, weakness and numbness.       Negative for incontinence or saddle anesthesia.  All other systems reviewed and are negative.   Physical Exam Updated Vital Signs BP (!) 132/107 (BP Location: Left Arm)   Pulse 74   Temp 98.3 F (36.8 C) (Oral)    Resp 20   SpO2 97%   Physical Exam Vitals and nursing note reviewed.  Constitutional:      General: She is not in acute distress.    Appearance: She is well-developed. She is not toxic-appearing.  HENT:     Head: Normocephalic and atraumatic.  Eyes:     General:        Right eye: No discharge.        Left eye: No discharge.     Conjunctiva/sclera: Conjunctivae normal.  Cardiovascular:     Rate and Rhythm: Normal rate and regular rhythm.  Pulmonary:     Effort: Pulmonary effort is normal. No respiratory distress.     Breath  sounds: Normal breath sounds. No wheezing, rhonchi or rales.  Abdominal:     General: There is no distension.     Palpations: Abdomen is soft.     Tenderness: There is abdominal tenderness in the left lower quadrant.  Genitourinary:    Comments: Chaperone present. No obvious anal fissure or significant thrombosed hemorrhoids.  No active bleeding.  No visualized blood.  DRE without significant stool, no melena, no hematochezia, and no bright red blood per rectum. Musculoskeletal:     Cervical back: Neck supple.     Comments: Back: No midline tenderness to palpation.  Patient does have some lateral left lumbar paraspinal muscle tenderness to palpation. Lower extremities: Intact active range of motion throughout the major joints of the lower extremities without focal bony tenderness.  Skin:    General: Skin is warm and dry.     Findings: No rash.  Neurological:     Mental Status: She is alert.     Comments: Clear speech.   Psychiatric:        Behavior: Behavior normal.     ED Results / Procedures / Treatments   Labs (all labs ordered are listed, but only abnormal results are displayed) Labs Reviewed  COMPREHENSIVE METABOLIC PANEL - Abnormal; Notable for the following components:      Result Value   CO2 20 (*)    Glucose, Bld 111 (*)    AST 8 (*)    All other components within normal limits  CBC - Abnormal; Notable for the following components:    WBC 12.5 (*)    All other components within normal limits  URINALYSIS, ROUTINE W REFLEX MICROSCOPIC - Abnormal; Notable for the following components:   APPearance HAZY (*)    Hgb urine dipstick LARGE (*)    Protein, ur 30 (*)    RBC / HPF >50 (*)    Bacteria, UA FEW (*)    All other components within normal limits  LIPASE, BLOOD  I-STAT BETA HCG BLOOD, ED ([facility], [facility], [facility] ONLY)    EKG None  Radiology CT Abdomen Pelvis W Contrast  Result Date: 07/16/2020 CLINICAL DATA:  LLQ abdominal pain primarily considering diverticulitis vs. nephrolithiasis EXAM: CT ABDOMEN AND PELVIS WITH CONTRAST TECHNIQUE: Multidetector CT imaging of the abdomen and pelvis was performed using the standard protocol following bolus administration of intravenous contrast. CONTRAST:  23mL OMNIPAQUE IOHEXOL 300 MG/ML  SOLN COMPARISON:  CT abdomen pelvis 07/16/2004. FINDINGS: Lower chest: No acute abnormality. Hepatobiliary: Interval development of an enhancing 2.1 cm left hepatic lobe lesion (3:10). Suggestion of another subcentimeter lesion (3:8). No gallstones, gallbladder wall thickening, or pericholecystic fluid. No biliary dilatation. Pancreas: No focal lesion. Normal pancreatic contour. No surrounding inflammatory changes. No main pancreatic ductal dilatation. Spleen: Normal in size without focal abnormality. Adrenals/Urinary Tract: No adrenal nodule bilaterally. Bilateral kidneys enhance symmetrically. Subcentimeter hypodensities are too small to characterize. Bilateral nephrolithiasis measuring up to 3 mm on the left and 4 mm on the right. No ureterolithiasis. No hydronephrosis. No hydroureter. The urinary bladder is unremarkable. Stomach/Bowel: Stomach is within normal limits. No evidence of small bowel wall thickening or dilatation. Diffuse bowel thickening of the descending colon with associated hyperemia of the submucosa. Pericolonic fat stranding is also noted along the left pericolic gutter. Appendix appears normal.  Vascular/Lymphatic: No significant vascular findings are present. No enlarged abdominal or pelvic lymph nodes. Reproductive: Uterus and bilateral adnexa are unremarkable. Corpus luteum cyst within the left ovary. Other: No intraperitoneal free fluid. No intraperitoneal free  gas. No organized fluid collection. Musculoskeletal: No abdominal wall hernia or abnormality No suspicious lytic or blastic osseous lesions. No acute displaced fracture. Bilateral L5 pars interarticularis defects. IMPRESSION: 1. Descending colon colitis. Etiology could be infectious, inflammatory, ischemic. 2. Interval development of a couple of enhancing hepatic lesions measuring up to 2.1 cm in the left lobe. Recommend MRI liver protocol further evaluation. 3. Nonobstructive bilateral nephrolithiasis (left 61mm, right 5mm). Electronically Signed   By: Tish Frederickson M.D.   On: 07/16/2020 01:55    Procedures Procedures   Medications Ordered in ED Medications  sodium chloride 0.9 % bolus 1,000 mL (0 mLs Intravenous Stopped 07/16/20 0352)  ondansetron (ZOFRAN) injection 4 mg (4 mg Intravenous Given 07/16/20 0115)  morphine 4 MG/ML injection 4 mg (4 mg Intravenous Given 07/16/20 0115)  iohexol (OMNIPAQUE) 300 MG/ML solution 80 mL (80 mLs Intravenous Contrast Given 07/16/20 0125)  amoxicillin-clavulanate (AUGMENTIN) 875-125 MG per tablet 1 tablet (1 tablet Oral Given 07/16/20 0357)  fentaNYL (SUBLIMAZE) injection 50 mcg (50 mcg Intravenous Given 07/16/20 0357)    ED Course  I have reviewed the triage vital signs and the nursing notes.  Pertinent labs & imaging results that were available during my care of the patient were reviewed by me and considered in my medical decision making (see chart for details).    MDM Rules/Calculators/A&P                         Patient presents to the ED with complaints of abdominal pain.  She is nontoxic, resting comfortably, vitals with somewhat elevated blood pressure, otherwise unremarkable.   Patient has left-sided lower quadrant abdominal tenderness as well as some lateral lumbar paraspinal muscle tenderness on that side as well.  She has no peritoneal signs.  Rectal exam was performed without obvious bleeding or source of blood.  There was no melena.  A pelvic exam was deferred given patient has had similar pain with a recent GYN assessment.  Additional history obtained:  Additional history obtained from chart review & nursing note review.   Lab Tests:  I reviewed and interpreted labs, which included:  CBC: Mild leukocytosis.  No significant anemia. CMP: Mildly low bicarb, BUN and creatinine are within normal limits and LFTs are within normal limits. Lipase: Within normal limits Urinalysis: Hematuria, few bacteria but does have squamous epithelial cells present. I obtained a fecal occult card with DRE which was negative.   Imaging Studies ordered:  I ordered imaging studies which included CT A/P, I independently reviewed, formal radiology impression shows: 1. Descending colon colitis. Etiology could be infectious, inflammatory, ischemic. 2. Interval development of a couple of enhancing hepatic lesions measuring up to 2.1 cm in the left lobe. Recommend MRI liver protocol further evaluation. 3. Nonobstructive bilateral nephrolithiasis (left 84mm, right 1mm)   ED Course:  Patient feeling improved S/p analgesics in the ED.  No ureteral stones noted.  No ovarian cysts or findings to suggest torsion. No UTI. No neuro deficits & ambulatory therefore do not suspect cord compression. Afebrile without hx of IVDU- do not suspect epidural abscess.  No overlying skin changes to suggest shingles.  No findings of perforation or obstruction on CT.  No blood noted on rectal exam, hemoglobin and hematocrit are within normal limits, and BUN is not elevated, overall reassuring from a potential GI bleed standpoint.  Work-up remarkable for findings consistent with colitis.  Given patient with pain in the  area of the descending colon feel  it is reasonable to treat her for a possible infectious process with Augmentin.  Patient tolerating p.o., feeling improved, will discharge home at this time. I discussed results, treatment plan, need for follow-up, and return precautions with the patient. Provided opportunity for questions, patient confirmed understanding and is in agreement with plan. [place] Controlled Substance reporting System queried. Findings and plan of care discussed with supervising physician Dr. Adela Lank who is in agreement.   Portions of this note were generated with Scientist, clinical (histocompatibility and immunogenetics). Dictation errors may occur despite best attempts at proofreading.  Final Clinical Impression(s) / ED Diagnoses Final diagnoses:  Colitis    Rx / DC Orders ED Discharge Orders         Ordered    amoxicillin-clavulanate (AUGMENTIN) 875-125 MG tablet  Every 12 hours        07/16/20 0358    HYDROcodone-acetaminophen (NORCO/VICODIN) 5-325 MG tablet  Every 6 hours PRN        07/16/20 0358    ondansetron (ZOFRAN ODT) 4 MG disintegrating tablet  Every 8 hours PRN        07/16/20 0358           Raymone Pembroke, Pleas Koch, PA-C 07/16/20 0358  Elizabeth Yang was evaluated in Emergency Department on 07/16/2020 for the symptoms described in the history of present illness. He/she was evaluated in the context of the global COVID-19 pandemic, which necessitated consideration that the patient might be at risk for infection with the SARS-CoV-2 virus that causes COVID-19. Institutional protocols and algorithms that pertain to the evaluation of patients at risk for COVID-19 are in a state of rapid change based on information released by regulatory bodies including the CDC and federal and state organizations. These policies and algorithms were followed during the patient's care in the ED.     Cherly Anderson, PA-C 07/16/20 0358    Melene Plan, DO 07/16/20 [phone]

## 2020-07-15 NOTE — ED Triage Notes (Signed)
Pt tested positive for COVID on Wednesday. Had a tele health visit with PCP today and they sent her here for further evaluation of RLQ pain and emesis. Pt also reports bloody diarrhea that started this morning. Resp e.u

## 2020-07-16 ENCOUNTER — Emergency Department (HOSPITAL_COMMUNITY): Payer: BC Managed Care – PPO

## 2020-07-16 MED ORDER — FENTANYL CITRATE (PF) 100 MCG/2ML IJ SOLN
50.0000 ug | Freq: Once | INTRAMUSCULAR | Status: AC
  Administered 2020-07-16: 50 ug via INTRAVENOUS
  Filled 2020-07-16: qty 2

## 2020-07-16 MED ORDER — ONDANSETRON 4 MG PO TBDP: 4.0000 mg | ORAL_TABLET | Freq: Three times a day (TID) | ORAL | 0 refills | Status: DC | PRN

## 2020-07-16 MED ORDER — IOHEXOL 300 MG/ML  SOLN
80.0000 mL | Freq: Once | INTRAMUSCULAR | Status: AC | PRN
  Administered 2020-07-16: 80 mL via INTRAVENOUS

## 2020-07-16 MED ORDER — HYDROCODONE-ACETAMINOPHEN 5-325 MG PO TABS: 1.0000 | ORAL_TABLET | Freq: Four times a day (QID) | ORAL | 0 refills | Status: DC | PRN

## 2020-07-16 MED ORDER — AMOXICILLIN-POT CLAVULANATE 875-125 MG PO TABS
1.0000 | ORAL_TABLET | Freq: Once | ORAL | Status: AC
  Administered 2020-07-16: 1 via ORAL
  Filled 2020-07-16: qty 1

## 2020-07-16 MED ORDER — AMOXICILLIN-POT CLAVULANATE 875-125 MG PO TABS: 1.0000 | ORAL_TABLET | Freq: Two times a day (BID) | ORAL | 0 refills | Status: DC

## 2020-07-16 NOTE — Discharge Instructions (Signed)
You are seen in the emergency department today for left-sided abdominal pain.  Your labs show that your white blood cell count was mildly elevated and that you had blood in your urine.  Your CT scan showed findings of a liver lesion, we would like you to discuss this with your primary care provider or with GI at your follow-up appointment to discuss having an MRI done to further evaluate this area.  Your CT scan also showed findings of colitis, please see attached handout.  We are treating this as a possible infectious colitis with Augmentin which is an antibiotic.  We are also sending you home with Zofran to take as needed for nausea and vomiting as well as Norco to take as needed for pain.  Norco-this is a narcotic/controlled substance medication that has potential addicting qualities.  We recommend that you take 1-2 tablets every 6 hours as needed for severe pain.  Do not drive or operate heavy machinery when taking this medicine as it can be sedating. Do not drink alcohol or take other sedating medications when taking this medicine for safety reasons.  Keep this out of reach of small children.  Please be aware this medicine has Tylenol in it (325 mg/tab) do not exceed the maximum dose of Tylenol in a day per over the counter recommendations should you decide to supplement with Tylenol over the counter.  We have prescribed you new medication(s) today. Discuss the medications prescribed today with your pharmacist as they can have adverse effects and interactions with your other medicines including over the counter and prescribed medications. Seek medical evaluation if you start to experience new or abnormal symptoms after taking one of these medicines, seek care immediately if you start to experience difficulty breathing, feeling of your throat closing, facial swelling, or rash as these could be indications of a more serious allergic reaction  Please follow-up with your primary care provider or with GI  within 3 days for reevaluation.  Return to the ER for new or worsening symptoms including but not limited to worsening pain, new pain, continued bleeding, dark or tarry stool, passing out, dizziness, inability to keep fluids down, fever, trouble breathing, chest pain, or any other concerns

## 2020-07-16 NOTE — ED Notes (Signed)
Patient transported to CT 

## 2020-07-19 ENCOUNTER — Other Ambulatory Visit: Payer: BC Managed Care – PPO

## 2020-07-24 ENCOUNTER — Other Ambulatory Visit: Payer: Self-pay | Admitting: Cardiology

## 2020-07-24 DIAGNOSIS — I10 Essential (primary) hypertension: Secondary | ICD-10-CM

## 2020-07-28 ENCOUNTER — Encounter: Payer: Self-pay | Admitting: Gastroenterology

## 2020-07-28 ENCOUNTER — Ambulatory Visit (INDEPENDENT_AMBULATORY_CARE_PROVIDER_SITE_OTHER): Payer: BC Managed Care – PPO | Admitting: Gastroenterology

## 2020-07-28 ENCOUNTER — Other Ambulatory Visit: Payer: Self-pay

## 2020-07-28 VITALS — HR 81 | Ht 62.0 in | Wt 136.0 lb

## 2020-07-28 DIAGNOSIS — K623 Rectal prolapse: Secondary | ICD-10-CM | POA: Diagnosis not present

## 2020-07-28 DIAGNOSIS — K648 Other hemorrhoids: Secondary | ICD-10-CM | POA: Insufficient documentation

## 2020-07-28 DIAGNOSIS — A09 Infectious gastroenteritis and colitis, unspecified: Secondary | ICD-10-CM | POA: Insufficient documentation

## 2020-07-28 NOTE — Patient Instructions (Addendum)
If you are age 39 or older, your body mass index should be between 23-30. Your Body mass index is 24.87 kg/m. If this is out of the aforementioned range listed, please consider follow up with your Primary Care Provider.  If you are age 21 or younger, your body mass index should be between 19-25. Your Body mass index is 24.87 kg/m. If this is out of the aformentioned range listed, please consider follow up with your Primary Care Provider.   Referral placed to [facility] - [phone]  Start probiotic daily such as Comptroller.

## 2020-07-28 NOTE — Progress Notes (Signed)
07/28/2020 Elizabeth Yang [id number] 1981/02/07   HISTORY OF PRESENT ILLNESS: This is a 40 year old female who is a patient of Dr. Marvell Fuller known to him only for flexible sigmoidoscopy, EGD in 2007.  She has not been seen here since that time.  She is here today for recent ER follow-up.  She says that she has had a lot going on recently and has been treated for sciatic nerve pain, etc.  Then February 12 she developed diarrhea and rectal bleeding.  Eventually she was just passing blood per rectum.  She went to the ER and CT scan of the abdomen pelvis with contrast was performed and showed the following:  IMPRESSION: 1. Descending colon colitis. Etiology could be infectious, inflammatory, ischemic. 2. Interval development of a couple of enhancing hepatic lesions measuring up to 2.1 cm in the left lobe. Recommend MRI liver protocol further evaluation. 3. Nonobstructive bilateral nephrolithiasis (left 22mm, right 47mm).  They gave her Augmentin 875 mg twice a day for 7 days.  She completed that.  She says that the diarrhea resolved and she has been having solid bowel movements as of 2 days ago.  Her bleeding has resolved as well.  She is on a short course of prednisone now to treat inflammation in her back, etc.  She says that a couple of days ago she noticed a protrusion from her rectum/anus.  She says that it is very uncomfortable.  She has been using creams, which hazel, Epsom sitz baths.  She says she has never had anything like this happen in the past.  She did take pictures that have been inserted into her chart and in my note below.  Follow-up MRI of the abdomen was performed for the liver lesions and showed the following:  IMPRESSION:   -2 left hepatic lobe hemangiomas. No suspicious findings.    Past Medical History:  Diagnosis Date  . Anxiety   . Asthma   . Headache   . History of kidney stones   . Hypertension   . Hypertension affecting pregnancy, antepartum 09/13/2016   . Vaginal Pap smear, abnormal   . Vitamin D deficiency    Past Surgical History:  Procedure Laterality Date  . CESAREAN SECTION N/A 09/27/2014   Procedure: CESAREAN SECTION;  Surgeon: Genia Del, MD;  Location: [facility];  Service: Obstetrics;  Laterality: N/A;  . CESAREAN SECTION N/A 09/13/2016   Procedure: Repeat CESAREAN SECTION;  Surgeon: Shea Evans, MD;  Location: [facility] BIRTHING SUITES;  Service: Obstetrics;  Laterality: N/A;  EDD: 09/26/16   . CESAREAN SECTION N/A 04/16/2018   Procedure: Repeat CESAREAN SECTION;  Surgeon: Shea Evans, MD;  Location: [facility] BIRTHING SUITES;  Service: Obstetrics;  Laterality: N/A;  EDD: 04/23/18  . TONSILLECTOMY    . WISDOM TOOTH EXTRACTION      reports that she has never smoked. She has never used smokeless tobacco. She reports that she does not drink alcohol and does not use drugs. family history includes Cancer in her maternal grandmother; Diabetes in her paternal grandmother; Heart attack in her maternal uncle and mother; Heart disease in her maternal aunt, maternal grandfather, maternal uncle, and mother; Hypertension in her mother; Kidney disease in her sister. No Known Allergies    Outpatient Encounter Medications as of 07/28/2020  Medication Sig  . albuterol (PROVENTIL HFA;VENTOLIN HFA) 108 (90 BASE) MCG/ACT inhaler Inhale 2 puffs into the lungs every 6 (six) hours as needed for wheezing or shortness of breath.  Marland Kitchen amLODipine (NORVASC) 10 MG  tablet TAKE 1 TABLET BY MOUTH EVERYDAY AT BEDTIME  . butalbital-acetaminophen-caffeine (FIORICET, ESGIC) 50-325-40 MG tablet Take 1 tablet by mouth 2 (two) times daily as needed for migraine.  . clonazePAM (KLONOPIN) 1 MG tablet Take by mouth.  Marland Kitchen HYDROcodone-acetaminophen (NORCO/VICODIN) 5-325 MG tablet Take 1-2 tablets by mouth every 6 (six) hours as needed.  . methyldopa (ALDOMET) 500 MG tablet Take 1 tablet (500 mg total) by mouth 3 (three) times daily.  . methylPREDNISolone (MEDROL DOSEPAK) 4 MG TBPK  tablet TAKE 6 TABLETS ON DAY 1 AS DIRECTED ON PACKAGE AND DECREASE BY 1 TAB EACH DAY FOR A TOTAL OF 6 DAYS  . Nebivolol HCl 20 MG TABS TAKE 1 TABLET BY MOUTH EVERY DAY  . norethindrone (MICRONOR) 0.35 MG tablet Take 1 tablet by mouth daily.  . ondansetron (ZOFRAN ODT) 4 MG disintegrating tablet Take 1 tablet (4 mg total) by mouth every 8 (eight) hours as needed for nausea or vomiting.  . rosuvastatin (CRESTOR) 20 MG tablet TAKE 1 TABLET BY MOUTH EVERY DAY  . sertraline (ZOLOFT) 100 MG tablet Take by mouth.  Marland Kitchen tiZANidine (ZANAFLEX) 4 MG tablet Take by mouth.  . WEGOVY 0.5 MG/0.5ML SOAJ inject 0.5mg  ONCE WEEKLY.  . [DISCONTINUED] levonorgestrel-ethinyl estradiol (ALESSE) 0.1-20 MG-MCG tablet Take 1 tablet by mouth daily.  . [DISCONTINUED] amoxicillin-clavulanate (AUGMENTIN) 875-125 MG tablet Take 1 tablet by mouth every 12 (twelve) hours.  . [DISCONTINUED] phentermine (ADIPEX-P) 37.5 MG tablet Take 37.5 mg by mouth daily.   No facility-administered encounter medications on file as of 07/28/2020.    REVIEW OF SYSTEMS  : All other systems reviewed and negative except where noted in the History of Present Illness.   PHYSICAL EXAM: Pulse 81   Ht 5\' 2"  (1.575 m)   Wt 136 lb (61.7 kg)   BMI 24.87 kg/m  General: Well developed white female in no acute distress Head: Normocephalic and atraumatic Eyes:  Sclerae anicteric, conjunctiva pink. Ears: Normal auditory acuity  Lungs: Clear throughout to auscultation; no W/R/R. Heart: Regular rate and rhythm; no M/R/G. Abdomen: Soft, non-distended.  BS present.  Minimal left sided TTP. Rectal:  No external abnormalities noted.  DRE did not reveal any masses and was just mild uncomfortable.  Valsalva produced some minimal prolapse with fullness in the perianal area.  Anoscopy revealed grade 2 internal hemorrhoids. Musculoskeletal: Symmetrical with no gross deformities  Skin: No lesions on visible extremities Extremities: No edema  Neurological: Alert  oriented x 4, grossly non-focal Psychological:  Alert and cooperative. Normal mood and affect      ASSESSMENT AND PLAN: *Rectal prolapse/internal hemorrhoids: This has just been an issue for the patient over the last few days.  We will plan for referral to colorectal surgery for evaluation. *Colitis seen on CT scan: Symptoms were sudden in onset.  Suspect infectious source.  She completed a 7-day course of Augmentin twice daily.  I suspect that the symptoms will continue to improve.  I recommended beginning a daily probiotic. *Liver lesions: Seen on recent CT scan.  She actually already had a follow-up MRI they confirmed that they were hemangiomas.  **Patient was examined by Dr. as well.   CC:  Leone Payor, MD

## 2020-08-02 ENCOUNTER — Other Ambulatory Visit: Payer: Self-pay

## 2020-08-02 ENCOUNTER — Ambulatory Visit: Payer: BC Managed Care – PPO

## 2020-08-02 DIAGNOSIS — I10 Essential (primary) hypertension: Secondary | ICD-10-CM

## 2020-08-10 NOTE — Progress Notes (Signed)
Primary Physician/Referring:  Stevphen Rochester, MD  Patient ID: Elizabeth Yang, female    DOB: 11/05/1980, 40 y.o.   MRN: [id number]  Chief Complaint  Patient presents with  . Hypertension  . Follow-up    6 weeks   HPI:    Elizabeth Yang  is a 40 y.o. Caucasian female with mixed hyperlipdemia, mild obesity, hypertension diagnosed at age 47 years, also has strong family history of premature CAD, mother had an MI at age 19, she has 3 maternal uncles who developed heart disease in their 30s and 64s, and her maternal grandfather died at age 65 of an MI.  Patient presents for 6-week follow-up of hypertension.  At last visit added methyldopa 5 mg 3 times daily as patient is considering pregnancy again.  Also last visit ordered renal artery duplex to exclude renal artery stenosis.  She was also advised to wean off phentermine for weight loss, which she has done. Unfortunately patient has not been able to start methyldopa as pharmacy has not been able to supply it.   Also since last visit patient had Covid-19 infection and has also been diagnosed with colitis for which she is taking antibiotics and following with GI. She states that since developing colitis she has daily episodes of dizziness, fatigue, and near syncope. During these episodes her blood pressure is soft, as low as 98/65 mmHg. She notes she continues to have occasional episodes when her diastolic pressure is >90 mmHg, however she is more concerned about symptomatic hypotension at this time.   She denies chest pain, dyspnea, orthopnea, PND, leg swelling, palpitations.   Past Medical History:  Diagnosis Date  . Anxiety   . Asthma   . Headache   . History of kidney stones   . Hypertension   . Hypertension affecting pregnancy, antepartum 09/13/2016  . Vaginal Pap smear, abnormal   . Vitamin D deficiency    Past Surgical History:  Procedure Laterality Date  . CESAREAN SECTION N/A 09/27/2014   Procedure: CESAREAN SECTION;   Surgeon: Genia Del, MD;  Location: [facility];  Service: Obstetrics;  Laterality: N/A;  . CESAREAN SECTION N/A 09/13/2016   Procedure: Repeat CESAREAN SECTION;  Surgeon: Shea Evans, MD;  Location: [facility] BIRTHING SUITES;  Service: Obstetrics;  Laterality: N/A;  EDD: 09/26/16   . CESAREAN SECTION N/A 04/16/2018   Procedure: Repeat CESAREAN SECTION;  Surgeon: Shea Evans, MD;  Location: [facility] BIRTHING SUITES;  Service: Obstetrics;  Laterality: N/A;  EDD: 04/23/18  . TONSILLECTOMY    . WISDOM TOOTH EXTRACTION     Family History  Problem Relation Age of Onset  . Heart disease Mother   . Hypertension Mother   . Heart attack Mother   . Kidney disease Sister   . Heart disease Maternal Aunt   . Heart disease Maternal Uncle   . Heart attack Maternal Uncle   . Cancer Maternal Grandmother   . Heart disease Maternal Grandfather   . Diabetes Paternal Grandmother     Social History   Tobacco Use  . Smoking status: Never Smoker  . Smokeless tobacco: Never Used  Substance Use Topics  . Alcohol use: Never   Marital Status: Married  ROS  Review of Systems  Constitutional: Positive for malaise/fatigue.  Cardiovascular: Positive for near-syncope. Negative for chest pain, dyspnea on exertion and leg swelling.  Gastrointestinal: Positive for abdominal pain and hematochezia. Negative for melena.  Neurological: Positive for dizziness.   Objective  Blood pressure 122/79, pulse 80, temperature 98.1  F (36.7 C), temperature source Temporal, resp. rate 16, height 5\' 2"  (1.575 m), weight 134 lb 12.8 oz (61.1 kg), SpO2 98 %, unknown if currently breastfeeding.  Vitals with BMI 08/11/2020 07/28/2020 07/16/2020  Height 5\' 2"  5\' 2"  -  Weight 134 lbs 13 oz 136 lbs -  BMI 24.65 24.87 -  Systolic 122 - 145  Diastolic 79 - 95  Pulse 80 81 76    05/18/2019  5\' 2"   188 lbs  34.38  129  93  77    Physical Exam Constitutional:      General: She is not in acute distress.    Appearance: Normal  appearance. She is well-developed and normal weight.     Comments: Patient appears fatigued  Cardiovascular:     Rate and Rhythm: Normal rate and regular rhythm.     Pulses: Intact distal pulses.     Heart sounds: Normal heart sounds. No murmur heard. No gallop.      Comments: No leg edema, no JVD. Pulmonary:     Effort: Pulmonary effort is normal.     Breath sounds: Normal breath sounds.  Abdominal:     General: Bowel sounds are normal.     Palpations: Abdomen is soft.     Tenderness: There is abdominal tenderness.  Skin:    General: Skin is warm and dry.    Laboratory examination:   Recent Labs    07/15/20 1418  NA 137  K 3.9  CL 108  CO2 20*  GLUCOSE 111*  BUN 14  CREATININE 0.82  CALCIUM 9.1  GFRNONAA >60   CrCl cannot be calculated (Patient's most recent lab result is older than the maximum 21 days allowed.).  CMP Latest Ref Rng & Units 07/15/2020 07/31/2019 04/20/2018  Glucose 70 - 99 mg/dL 161(W) 960(A) 86  BUN 6 - 20 mg/dL 14 15 19   Creatinine 0.44 - 1.00 mg/dL 5.40 9.81 1.91  Sodium 135 - 145 mmol/L 137 137 137  Potassium 3.5 - 5.1 mmol/L 3.9 4.2 3.8  Chloride 98 - 111 mmol/L 108 105 107  CO2 22 - 32 mmol/L 20(L) 23 21(L)  Calcium 8.9 - 10.3 mg/dL 9.1 9.3 4.7(W)  Total Protein 6.5 - 8.1 g/dL 7.1 7.7 6.6  Total Bilirubin 0.3 - 1.2 mg/dL 0.9 0.7 0.4  Alkaline Phos 38 - 126 U/L 49 63 106  AST 15 - 41 U/L 8(L) 9(L) 23  ALT 0 - 44 U/L 15 17 59(H)   CBC Latest Ref Rng & Units 07/15/2020 07/31/2019 04/20/2018  WBC 4.0 - 10.5 K/uL 12.5(H) 5.5 7.4  Hemoglobin 12.0 - 15.0 g/dL 29.5 15.4(H) 12.0  Hematocrit 36.0 - 46.0 % 43.1 44.7 36.3  Platelets 150 - 400 K/uL 238 223 201    Lipid Panel     Component Value Date/Time   CHOL 115 07/01/2019 0845   TRIG 71 07/01/2019 0845   HDL 41 07/01/2019 0845   LDLCALC 59 07/01/2019 0845   LDLDIRECT 63 07/01/2019 0844   HEMOGLOBIN A1C No results found for: HGBA1C, MPG TSH No results for input(s): TSH in the last 8760  hours. Ref Range & Units 05/18/2019   Vitamin D 1, 25 (OH)2 Total pg/mL 53    External labs:   12/31/2018: Serum glucose 100 g, BUN 17, creatinine 1.0, EGFR greater than 69, potassium 4.8.  Hb 15.3/HCT 44.8, platelets 216.  Normal indicis.   Total cholesterol 285, triglycerides 218, HDL 43, LDL 198.  Non-HDL cholesterol 242.  06/03/2015: Total cholesterol 288,  triglycerides 245, HDL 62, LDL 177, serum glucose 107, creatinine 1.0, potassium 4.5, CMP otherwise normal.  Medications and allergies  No Known Allergies  Current Outpatient Medications on File Prior to Visit  Medication Sig Dispense Refill  . albuterol (PROVENTIL HFA;VENTOLIN HFA) 108 (90 BASE) MCG/ACT inhaler Inhale 2 puffs into the lungs every 6 (six) hours as needed for wheezing or shortness of breath.    . butalbital-acetaminophen-caffeine (FIORICET, ESGIC) 50-325-40 MG tablet Take 1 tablet by mouth 2 (two) times daily as needed for migraine.    . ciprofloxacin (CIPRO) 250 MG tablet Take 250 mg by mouth 2 (two) times daily.    . clonazePAM (KLONOPIN) 1 MG tablet Take by mouth.    . metroNIDAZOLE (FLAGYL) 500 MG tablet Take 500 mg by mouth 2 (two) times daily.    . Nebivolol HCl 20 MG TABS TAKE 1 TABLET BY MOUTH EVERY DAY 90 tablet 1  . ondansetron (ZOFRAN ODT) 4 MG disintegrating tablet Take 1 tablet (4 mg total) by mouth every 8 (eight) hours as needed for nausea or vomiting. 8 tablet 0  . rosuvastatin (CRESTOR) 20 MG tablet TAKE 1 TABLET BY MOUTH EVERY DAY 30 tablet 2  . sertraline (ZOLOFT) 100 MG tablet Take by mouth.    . Sulfamethoxazole-Trimethoprim (BACTRIM PO) Take by mouth.    Marland Kitchen tiZANidine (ZANAFLEX) 4 MG tablet Take by mouth.    . WEGOVY 0.5 MG/0.5ML SOAJ inject 0.5mg  ONCE WEEKLY.    . methyldopa (ALDOMET) 500 MG tablet Take 1 tablet (500 mg total) by mouth 3 (three) times daily. (Patient not taking: Reported on 08/11/2020) 90 tablet 2   No current facility-administered medications on file prior to visit.     Radiology:   No results found.  Cardiac Studies:  Renal artery duplex 08/02/2020:  No evidence of renal artery occlusive disease in either renal artery.  Normal intrarenal vascular perfusion is noted in both kidneys.  Renal length is within normal limits for both kidneys.  Normal abdominal aorta flow velocities noted. No significant plaque burden  noted in the abdominal aorta.   Echocardiogram  [07/27/2015]:  Left ventricle cavity is normal in size. Normal global wall motion. Normal diastolic filling pattern. Calculated EF 61%. Trace mitral regurgitation. Trace tricuspid regurgitation. Unable to estimate PA pressure due to absence/minimal TR signal. Normal echocardiogram.  Treadmill stress test [08/01/2015]:  Indication: CP, Shortness of breath The patient exercised according to Bruce Protocol, Total time recorded 9:00 min achieving max heart rate of 164 which was 88% of MPHR for age and 10.16 METS of work. Normal BP response. Resting ECG showing NSR. There was no ST-T changes of ischemia with exercise stress test. No arrythmias noted. Stress terminated due to shortness of breath and THR >85% met. Excellent effort. Normal exercise stress test. Normal BP response.   EKG:    EKG 06/30/2020: Normal sinus rhythm at rate of 74 bpm, normal axis.  No evident ischemia, normal EKG.   Assessment     ICD-10-CM   1. Resistant hypertension  I10   2. Hypercholesteremia  E78.00     Meds ordered this encounter  Medications  . amLODipine (NORVASC) 5 MG tablet    Sig: Take 1 tablet (5 mg total) by mouth daily.    Dispense:  60 tablet    Refill:  2    Medications Discontinued During This Encounter  Medication Reason  . HYDROcodone-acetaminophen (NORCO/VICODIN) 5-325 MG tablet Error  . methylPREDNISolone (MEDROL DOSEPAK) 4 MG TBPK tablet Error  . norethindrone (  MICRONOR) 0.35 MG tablet Error  . amLODipine (NORVASC) 10 MG tablet     Recommendations:   TYERRA DIEMERT  is a  40 y.o.  Caucasian female with mixed hyperlipdemia, mild obesity, hypertension diagnosed at age 26 years, also has strong family history of premature CAD, mother had an MI at age 89, she has 3 maternal uncles who developed heart disease in their 47s and 75s, and her maternal grandfather died at age 51 of an MI. Therapy for hyperlipidemia has been held due to patient lactating /child bearing.  Her 1st pregnancy in 2014 had bad outcomes due to uncontrolled hypertension, PET, she lost her 1st child.   Patient presents for 6-week follow-up of hypertension. She was advised to start methyldopa at her last visit, however pharmacy has been unable to supply it and therefore she has not started methyldopa. Reviewed and discussed with patient results of renal artery duplex, details above.  No evidence of renal artery stenosis.   Since last visit patient developed Covid-19 infection and colitis, and she is now having frequent episodes of symptomatic hypotension. In the setting of acute illness, will reduce amlodipine from 10 mg to 5 mg daily and advised her to hold off on starting methyldopa at this time. Will plan to follow closely.   Follow up in 6 weeks, sooner if needed, for blood pressure management.    Rayford Halsted, PA-C 08/12/2020, 7:52 AM Office: [phone]

## 2020-08-11 ENCOUNTER — Other Ambulatory Visit: Payer: Self-pay

## 2020-08-11 ENCOUNTER — Encounter: Payer: Self-pay | Admitting: Student

## 2020-08-11 ENCOUNTER — Ambulatory Visit: Payer: BC Managed Care – PPO | Admitting: Student

## 2020-08-11 ENCOUNTER — Ambulatory Visit: Payer: BC Managed Care – PPO | Admitting: Cardiology

## 2020-08-11 VITALS — BP 122/79 | HR 80 | Temp 98.1°F | Resp 16 | Ht 62.0 in | Wt 134.8 lb

## 2020-08-11 DIAGNOSIS — E7800 Pure hypercholesterolemia, unspecified: Secondary | ICD-10-CM

## 2020-08-11 DIAGNOSIS — I10 Essential (primary) hypertension: Secondary | ICD-10-CM

## 2020-08-11 MED ORDER — AMLODIPINE BESYLATE 5 MG PO TABS: 5.0000 mg | ORAL_TABLET | Freq: Every day | ORAL | 2 refills | Status: DC

## 2020-08-17 ENCOUNTER — Encounter: Payer: Self-pay | Admitting: Internal Medicine

## 2020-08-17 ENCOUNTER — Other Ambulatory Visit: Payer: Self-pay

## 2020-08-17 ENCOUNTER — Ambulatory Visit: Payer: BC Managed Care – PPO | Admitting: Internal Medicine

## 2020-08-17 VITALS — BP 119/83 | HR 84 | Temp 97.5°F | Resp 16 | Ht 62.0 in | Wt 134.0 lb

## 2020-08-17 DIAGNOSIS — R197 Diarrhea, unspecified: Secondary | ICD-10-CM

## 2020-08-17 DIAGNOSIS — L0292 Furuncle, unspecified: Secondary | ICD-10-CM

## 2020-08-17 DIAGNOSIS — K529 Noninfective gastroenteritis and colitis, unspecified: Secondary | ICD-10-CM | POA: Diagnosis not present

## 2020-08-17 NOTE — Patient Instructions (Signed)
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   No further antibiotics are needed. If any is given, we can discuss if you would benefit from them   On the other note, if your colitis symptoms (bloody diarrhea) recur, you would need an endoscopy/GI evaluation to rule out inflammatory bowel disease   Follow up as needed with me if boils return Keep well hydration

## 2020-08-17 NOTE — Progress Notes (Signed)
[facility]  Reason for Consult:boils    Patient Active Problem List   Diagnosis Date Noted  . Rectal prolapse 07/28/2020  . Internal hemorrhoids 07/28/2020  . Infectious colitis 07/28/2020  . Chronic hypertension affecting pregnancy 04/16/2018  . Postpartum care following cesarean delivery (11/13) 04/16/2018  . Cesarean delivery delivered 09/13/2016      HPI: Elizabeth Yang is a 40 y.o. female on OCP, overewight, htn, referred here for  boils/abscesses  A month ago was the last episode. Onset suprapubic pain Of note she also got tested for covid after the onset of abdominal pain. She developed red blood per rectum and gross hematuria as well at that time and was admitted to the hospital. She was given augmentin during the admission. She was told to have rectal prolapse. This was at [facility] ed. I reviewed that she wasn't admitted   After the admission she developed the boils in the labia, and proximal right thigh, and a couple around the external perianal area. These occurred after the augmentin was done. These lesions lasted for over a week. She was given diflucan 3 days, nystatin ointment, and bactrim 2 week ago. She just finished the bactrim a few days ago. Lesions better on on the bactrim. She was also a few days ago given cipro/flagyl for "uti" and skin condition, extended on bactrim as well. This was done by her obgyn  She last finished bactrim/cipro/flagyl a day prior to coming here  She said the last week or so feels tired, dizzy, diarrhea 3-4 times a day daily, nausea, poor intake while on the antibiotics. When she does a lot of activities, would start having dry heaving.  No fever, chill  All boil lesion had resolved. No more rectal bleeding. No pain with urination/urgency/frequency  Husband just got over covid and doing well. Her sons are doing well.  A year prior to this visit right lateral thigh abscess requiring  I&D at urgent care. Told to have MRSA  When she was in college she had an abscess in the groin which was I&D as well.  He had seen f/u with gi on f/u who is deferring further w/u at this time for endoscopy.    PMH: Anxiety Migraine htn Asthma hlp  Soc: No smoking, driking, drugs Married Social research officer, government; On ocp   Review of Systems: ROS Other ros negative      Past Medical History:  Diagnosis Date  . Anxiety   . Asthma   . Headache   . History of kidney stones   . Hypertension   . Hypertension affecting pregnancy, antepartum 09/13/2016  . Vaginal Pap smear, abnormal   . Vitamin D deficiency     Social History   Tobacco Use  . Smoking status: Never Smoker  . Smokeless tobacco: Never Used  Vaping Use  . Vaping Use: Never used  Substance Use Topics  . Alcohol use: Never  . Drug use: No    Family History  Problem Relation Age of Onset  . Heart disease Mother   . Hypertension Mother   . Heart attack Mother   . Kidney disease Sister   . Heart disease Maternal Aunt   . Heart disease Maternal Uncle   . Heart attack Maternal Uncle   . Cancer Maternal Grandmother   . Heart disease Maternal Grandfather   . Diabetes Paternal Grandmother     No Known Allergies  OBJECTIVE: There were no  vitals filed for this visit. There is no height or weight on file to calculate BMI.   Physical Exam Constitutional:      Appearance: She is obese.     Comments: Anxious appearing  HENT:     Head: Normocephalic.     Mouth/Throat:     Mouth: Mucous membranes are moist.     Pharynx: Oropharynx is clear.  Eyes:     Conjunctiva/sclera: Conjunctivae normal.  Cardiovascular:     Rate and Rhythm: Normal rate and regular rhythm.     Pulses: Normal pulses.     Heart sounds: Normal heart sounds.  Pulmonary:     Effort: Pulmonary effort is normal.     Breath sounds: Normal breath sounds.  Abdominal:     Palpations: Abdomen is soft.  Musculoskeletal:        General:  Normal range of motion.     Cervical back: Normal range of motion.  Skin:    Findings: No rash.  Neurological:     General: No focal deficit present.     Mental Status: She is alert and oriented to person, place, and time. Mental status is at baseline.     Cranial Nerves: No cranial nerve deficit.     Motor: No weakness.  Psychiatric:        Behavior: Behavior normal.        Thought Content: Thought content normal.     Lab: Reviewed 07/2020 labs  Microbiology:  Serology:  Imaging:   Assessment/plan: Problem List Items Addressed This Visit   None   Visit Diagnoses    Boils of multiple sites    -  Primary   Diarrhea, unspecified type       Colitis          This is a 40 yo female with 2 episodes of what appears to have been staph aureus ssi apart by several years, but recent colitis presentation with subsequent boil/carbuncle around her genitalia/perianal area that improved with bactrim. She currently endorsed sx that is suggestive of antibiotics side effect. She has no sx of ongoing colitis and endorsed that her boils had resolved. She just finished abx 1 day before this visit  We discussed possibility of the boils --> recurrent staph aureus ssi vs hidradentitis. It is difficult to tell. If recurrent in the same distribution (and/or axillary, around the breast, suprapubic) would lean toward HA. She is on oral OCP and is overweight which are associated with aggravating factors for HA.  I can't make a connection of her colitis presentation then antibiotics then the boils in terms of causality. At least this is the first episode so would be difficult to make any connection. If inflammatory bowel disease is present, one could expect erythema nodosum or pathergy/pyoderma process which don't fit with her boils picture.   Would continue to observe for recurrent. At this time no indication for any other abx.   -f/u with me if recurrent boils -if recurrent bloody diarrhea/abd pain  f/u GI -avoid further abx at this time         Follow-up: Return if symptoms worsen or fail to improve.    I spent 60 minute reviewing data/chart, and coordinating care and >50% direct face to face time providing counseling/discussing diagnostics/treatment plan with patient   Raymondo Band, MD [facility] [phone] pager   [phone] cell 08/17/2020, 2:03 PM

## 2020-08-22 ENCOUNTER — Telehealth: Payer: Self-pay

## 2020-08-22 NOTE — Telephone Encounter (Signed)
I have not. I will refax referral and follow up with [facility] that they have spoken with patient tomorrow.

## 2020-08-22 NOTE — Telephone Encounter (Signed)
-----   Message from Leta Baptist, PA-C sent at 08/22/2020  8:45 AM EDT ----- Regarding: RE: did she see surgery? No, I have not.  I will check to see if she was seen.  Lewis Grivas, can you touch base with her and see if she had an appt with [facility] yet?  Thank you,  Jess   ----- Message ----- From: Iva Boop, MD Sent: 08/21/2020   5:50 AM EDT To: Princella Pellegrini Zehr, PA-C Subject: did she see surgery?                           Have you gotten anything from surgeon?

## 2020-08-22 NOTE — Telephone Encounter (Signed)
Elizabeth Yang have you gotten a response back for this referral?

## 2020-08-24 NOTE — Telephone Encounter (Signed)
Patient schedule with Dr. Maisie Fus on April 18th at 3 pm

## 2020-08-30 ENCOUNTER — Telehealth: Payer: Self-pay | Admitting: Gastroenterology

## 2020-08-30 NOTE — Telephone Encounter (Signed)
My chart message from pt: "I've continued to throw up over the past several weeks, especially if I do any physical activity.  I'm very weak and dizzy and have continued to feel horrible every day.  My other doctors are not sure why I continue to be so sick and told me to reach back out to you all. "

## 2020-08-31 NOTE — Telephone Encounter (Signed)
Can you get more details as to what she means by "feeling sick"?  When I saw her in the office she had what I thought was acute infectious colitis and her symptoms were much better when I saw her.  The biggest issue at the time of her visit with me was the rectal prolapse.  Dr. Leone Payor,  Lorain Childes, she is not scheduled to see surgery about the prolapse until mid April.

## 2020-08-31 NOTE — Telephone Encounter (Signed)
The pt states she has some "infections" all over her body. I asked her to elaborate and she says she had some female issues with blisters on her vagina and "other female problems".  She tells me she was put on about 5 abx. She also now has thrush and is on diflucan for that.   She says she is much better from those issues and has finished her abx.  She says that she now has weakness, fatigue, nausea and vomiting.  She tells me she was seen by ID and asked to see GI again.

## 2020-08-31 NOTE — Telephone Encounter (Signed)
I spoke with the Elizabeth Yang and she has been scheduled to see Dr Leone Payor.  She will call if she has worse symptoms prior to that appt.

## 2020-08-31 NOTE — Telephone Encounter (Signed)
I am not sure what is causing her symptoms.  I reviewed her labs from yesterday and everything looks good from what I can tell.  It looks like she has another appointment with infectious disease tomorrow.  She probably needs another visit in our office to get some more history since it sounds like there has been a lot more issues since she was seen by [place].

## 2020-09-01 ENCOUNTER — Ambulatory Visit: Payer: BC Managed Care – PPO | Admitting: Infectious Disease

## 2020-09-01 ENCOUNTER — Encounter: Payer: Self-pay | Admitting: Infectious Disease

## 2020-09-01 ENCOUNTER — Other Ambulatory Visit: Payer: Self-pay

## 2020-09-01 VITALS — BP 117/82 | HR 83 | Temp 98.3°F | Ht 62.0 in | Wt 133.0 lb

## 2020-09-01 DIAGNOSIS — K5900 Constipation, unspecified: Secondary | ICD-10-CM | POA: Diagnosis not present

## 2020-09-01 DIAGNOSIS — R112 Nausea with vomiting, unspecified: Secondary | ICD-10-CM

## 2020-09-01 DIAGNOSIS — L0292 Furuncle, unspecified: Secondary | ICD-10-CM

## 2020-09-01 DIAGNOSIS — K625 Hemorrhage of anus and rectum: Secondary | ICD-10-CM

## 2020-09-01 DIAGNOSIS — R319 Hematuria, unspecified: Secondary | ICD-10-CM

## 2020-09-01 DIAGNOSIS — N200 Calculus of kidney: Secondary | ICD-10-CM

## 2020-09-01 DIAGNOSIS — R195 Other fecal abnormalities: Secondary | ICD-10-CM

## 2020-09-01 DIAGNOSIS — A09 Infectious gastroenteritis and colitis, unspecified: Secondary | ICD-10-CM

## 2020-09-01 DIAGNOSIS — K623 Rectal prolapse: Secondary | ICD-10-CM

## 2020-09-01 DIAGNOSIS — M545 Low back pain, unspecified: Secondary | ICD-10-CM | POA: Diagnosis not present

## 2020-09-01 DIAGNOSIS — R5383 Other fatigue: Secondary | ICD-10-CM

## 2020-09-01 DIAGNOSIS — E860 Dehydration: Secondary | ICD-10-CM

## 2020-09-01 HISTORY — DX: Hematuria, unspecified: R31.9

## 2020-09-01 HISTORY — DX: Other fecal abnormalities: R19.5

## 2020-09-01 HISTORY — DX: Constipation, unspecified: K59.00

## 2020-09-01 HISTORY — DX: Hemorrhage of anus and rectum: K62.5

## 2020-09-01 HISTORY — DX: Dehydration: E86.0

## 2020-09-01 HISTORY — DX: Calculus of kidney: N20.0

## 2020-09-01 HISTORY — DX: Nausea with vomiting, unspecified: R11.2

## 2020-09-01 HISTORY — DX: Furuncle, unspecified: L02.92

## 2020-09-01 NOTE — Progress Notes (Signed)
Subjective:   Chief complaints: Fatigue low back pain intermittent loose stools with constipation right-sided flank pain with hematuria and recent yeast infection   Patient ID: Elizabeth Yang, female    DOB: 1980/07/24, 40 y.o.   MRN: [id number]  HPI   40 year old Caucasian female by my partner Dr. Renold Don earlier this month. .  She also was having pain in her low back the time left back all issues to me today right side hurts more.  She saw OB/GYN and pelvic exam not find any pathology.  They were concerned whether she might have sciatica though I do not get that when I take history with her.  She was apparently given steroids which did not relieve anything.  She then developed nausea and vomiting she tested positive for COVID-19.  Symptoms into the ER with problems of vomiting and diarrhea.  Is also having blood per rectum at that time.  She came to the ER CT abdomen pelvis was performed which showed inflammation in the descending colon.  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  She was given Augmentin twice a day for 7 days and completed that.  Diarrhea resolved.  She was seen by l gastroenterology to have a rectal prolapse and was referred to surgery for this.  She was also found to have boils on her labia and proximal thigh and prescribed several different antibiotics.  She was given Bactrim then after finishing that was given ciprofloxacin and metronidazole with extension of her Bactrim.  She then was referred to my partner Dr. Renold Don lesions had resolved.  She was continue however to have multiple GI symptoms including loose stools with constipation and fatigue.  Dr. Renold Don thought may be some of the symptoms were due to continuing on these broad-spectrum antibiotics which were stopped.  She has been off antibiotics but continues have trouble with alternating constipation and loose stools.  She also has right-sided flank  pain and has hematuria.  She has been seen by PCP she has noted on labs done at primary care physician's office that her carbon dioxide levels have been abnormal as is her serum creatinine.  She is told me she is throwing up at least 4 times a day and suggested to her that these electrolyte abnormalities and renal dysfunction were likely due to her nausea and vomiting.  He also continues to complain of profound fatigue.   She had some sores develop in her mouth and was diagnosed with thrush and given fluconazole by PCP for that with resolution of her symptoms.    Past Medical History:  Diagnosis Date  . Anxiety   . Asthma   . Blood per rectum 09/01/2020  . Boils 09/01/2020  . Constipation 09/01/2020  . Dehydration 09/01/2020  . Headache   . Hematuria 09/01/2020  . History of kidney stones   . Hypertension   . Hypertension affecting pregnancy, antepartum 09/13/2016  . Kidney stones 09/01/2020  . Loose stools 09/01/2020  . Nausea and vomiting 09/01/2020  . Vaginal Pap smear, abnormal   . Vitamin D deficiency     Past Surgical History:  Procedure Laterality Date  . CESAREAN SECTION N/A 09/27/2014   Procedure: CESAREAN SECTION;  Surgeon: Genia Del, MD;  Location: [facility];  Service: Obstetrics;  Laterality: N/A;  . CESAREAN SECTION N/A 09/13/2016   Procedure: Repeat CESAREAN SECTION;  Surgeon: Shea Evans, MD;  Location: [facility];  Service: Obstetrics;  Laterality: N/A;  EDD: 09/26/16   . CESAREAN SECTION N/A 04/16/2018   Procedure: Repeat CESAREAN SECTION;  Surgeon: Shea EvansMody, Vaishali, MD;  [facility];  Service: Obstetrics;  Laterality: N/A;  EDD: 04/23/18  . TONSILLECTOMY    . WISDOM TOOTH EXTRACTION      Family History  Problem Relation Age of Onset  . Heart disease Mother   . Hypertension Mother   . Heart attack Mother   . Kidney disease Sister   . Heart disease Maternal Aunt   . Heart disease Maternal Uncle   . Heart attack Maternal Uncle   .  Cancer Maternal Grandmother   . Heart disease Maternal Grandfather   . Diabetes Paternal Grandmother       Social History   Socioeconomic History  . Marital status: Married    Spouse name: Not on file  . Number of children: 3  . Years of education: Not on file  . Highest education level: Not on file  Occupational History  . Not on file  Tobacco Use  . Smoking status: Never Smoker  . Smokeless tobacco: Never Used  Vaping Use  . Vaping Use: Never used  Substance and Sexual Activity  . Alcohol use: Never  . Drug use: No  . Sexual activity: Yes    Birth control/protection: None  Other Topics Concern  . Not on file  Social History Narrative  . Not on file   Social Determinants of Health   Financial Resource Strain: Not on file  Food Insecurity: Not on file  Transportation Needs: Not on file  Physical Activity: Not on file  Stress: Not on file  Social Connections: Not on file    No Known Allergies   Current Outpatient Medications:  .  albuterol (PROVENTIL HFA;VENTOLIN HFA) 108 (90 BASE) MCG/ACT inhaler, Inhale 2 puffs into the lungs every 6 (six) hours as needed for wheezing or shortness of breath., Disp: , Rfl:  .  amLODipine (NORVASC) 5 MG tablet, Take 1 tablet (5 mg total) by mouth daily., Disp: 60 tablet, Rfl: 2 .  butalbital-acetaminophen-caffeine (FIORICET, ESGIC) 50-325-40 MG tablet, Take 1 tablet by mouth 2 (two) times daily as needed for migraine., Disp: , Rfl:  .  clonazePAM (KLONOPIN) 1 MG tablet, Take by mouth., Disp: , Rfl:  .  fluconazole (DIFLUCAN) 100 MG tablet, Take by mouth., Disp: , Rfl:  .  levonorgestrel-ethinyl estradiol (SRONYX) 0.1-20 MG-MCG tablet, Take 1 tablet by mouth daily., Disp: , Rfl:  .  Nebivolol HCl 20 MG TABS, TAKE 1 TABLET BY MOUTH EVERY DAY, Disp: 90 tablet, Rfl: 1 .  ondansetron (ZOFRAN) 4 MG tablet, Take 4 mg by mouth every 8 (eight) hours as needed., Disp: , Rfl:  .  ondansetron (ZOFRAN-ODT) 4 MG disintegrating tablet, Take by  mouth., Disp: , Rfl:  .  rosuvastatin (CRESTOR) 20 MG tablet, TAKE 1 TABLET BY MOUTH EVERY DAY, Disp: 30 tablet, Rfl: 2 .  sertraline (ZOLOFT) 100 MG tablet, Take by mouth., Disp: , Rfl:  .  tiZANidine (ZANAFLEX) 4 MG tablet, Take by mouth., Disp: , Rfl:  .  WEGOVY 2.4 MG/0.75ML SOAJ, INJECT 1 MILILGRAM UNDER SKIN WEEKLY, Disp: , Rfl:  .  phentermine (ADIPEX-P) 37.5 MG tablet, Take 37.5 mg by mouth daily. (Patient not taking: Reported on 09/01/2020), Disp: , Rfl:    Review of Systems  Constitutional: Positive for fatigue. Negative for activity change, appetite change, chills, diaphoresis, fever and unexpected weight change.  HENT: Negative for congestion, rhinorrhea, sinus pressure,  sneezing, sore throat and trouble swallowing.   Eyes: Negative for photophobia and visual disturbance.  Respiratory: Negative for cough, chest tightness, shortness of breath, wheezing and stridor.   Cardiovascular: Negative for chest pain, palpitations and leg swelling.  Gastrointestinal: Positive for blood in stool, diarrhea, nausea and vomiting. Negative for abdominal distention, abdominal pain, anal bleeding and constipation.  Genitourinary: Positive for flank pain and hematuria. Negative for difficulty urinating and dysuria.  Musculoskeletal: Positive for back pain. Negative for arthralgias, gait problem, joint swelling and myalgias.  Skin: Negative for color change, pallor, rash and wound.  Neurological: Negative for dizziness, tremors, weakness and light-headedness.  Hematological: Negative for adenopathy. Does not bruise/bleed easily.  Psychiatric/Behavioral: Positive for dysphoric mood. Negative for agitation, behavioral problems, confusion, decreased concentration and sleep disturbance.       Objective:   Physical Exam Constitutional:      General: She is not in acute distress.    Appearance: Normal appearance. She is well-developed. She is not ill-appearing or diaphoretic.  HENT:     Head:  Normocephalic and atraumatic.     Right Ear: Hearing and external ear normal.     Left Ear: Hearing and external ear normal.     Nose: No nasal deformity or rhinorrhea.  Eyes:     General: No scleral icterus.    Conjunctiva/sclera: Conjunctivae normal.     Right eye: Right conjunctiva is not injected.     Left eye: Left conjunctiva is not injected.     Pupils: Pupils are equal, round, and reactive to light.  Neck:     Vascular: No JVD.  Cardiovascular:     Rate and Rhythm: Normal rate and regular rhythm.     Heart sounds: Normal heart sounds, S1 normal and S2 normal. No murmur heard. No friction rub. No gallop.   Pulmonary:     Effort: Pulmonary effort is normal. No respiratory distress.     Breath sounds: Normal breath sounds. No stridor. No rhonchi.  Abdominal:     General: Abdomen is flat. Bowel sounds are normal. There is no distension.     Palpations: Abdomen is soft. There is no mass.     Tenderness: There is no abdominal tenderness. There is no right CVA tenderness or left CVA tenderness.     Hernia: No hernia is present.  Musculoskeletal:        General: Normal range of motion.     Right shoulder: Normal.     Left shoulder: Normal.     Cervical back: Normal range of motion and neck supple.     Right hip: Normal.     Left hip: Normal.     Right knee: Normal.     Left knee: Normal.  Lymphadenopathy:     Head:     Right side of head: No submandibular, preauricular or posterior auricular adenopathy.     Left side of head: No submandibular, preauricular or posterior auricular adenopathy.     Cervical: No cervical adenopathy.     Right cervical: No superficial or deep cervical adenopathy.    Left cervical: No superficial or deep cervical adenopathy.  Skin:    General: Skin is warm and dry.     Coloration: Skin is not pale.     Findings: No abrasion, bruising, ecchymosis, erythema, lesion or rash.     Nails: There is no clubbing.  Neurological:     General: No focal  deficit present.     Mental Status: She is alert and  oriented to person, place, and time.     Sensory: No sensory deficit.     Coordination: Coordination normal.     Gait: Gait normal.  Psychiatric:        Attention and Perception: Attention and perception normal. She is attentive.        Mood and Affect: Mood is anxious and depressed.        Speech: Speech normal.        Behavior: Behavior normal. Behavior is cooperative.        Thought Content: Thought content normal.        Cognition and Memory: Cognition and memory normal.        Judgment: Judgment normal.           Assessment & Plan:   Problems with boils: The seems to have been just once this year 2022 and on the past they are not active now.  Thrush: This is resolved.  Colitis and on imaging: Perhaps was related to her COVID-19 infection.  Certainly there is no need for further antibiotic therapy for this.  Nausea vomiting loose stools and blood per rectum: I have counseled her to engage again with Dr. Leone Payor and gastroenterology.  Certainly run tests here in our clinic for inflammatory bowel disease but I think it be prudent for her to see a gastroenterologist.  To prolapse she has been referred to general surgery.  Flank pain with hematuria certainly could be due to kidney stones.  Does not have a urologist he follows with but PCP has been following her.  Profound fatigue could be multifactorial certainly having COVID-19 itself could cause fatigue along with her multiple other symptoms that she has been suffering from.  I spent greater than 40 minutes with the patient including greater than 50% of time in face to face counsel of the patient during the above-mentioned problems and in coordination of her care.

## 2020-09-02 ENCOUNTER — Telehealth: Payer: Self-pay

## 2020-09-02 NOTE — Telephone Encounter (Signed)
Received call from patient, states she was seen in the office yesterday by Dr. Daiva Eves and told that she was most likely passing another kidney stone. Patient states she was seen by her PCP on Tuesday and was told that she had blood in her urine. Patient states the pain has increased significantly since last night and is wondering if Dr. Daiva Eves can prescribe something for the pain, as she has typically had Vicodin in the past for kidney stones. Patient denies fevers, but complains of headaches and states she has been throwing up a lot this week. RN advised patient to reach out to her PCP regarding pain management or advised that she could be seen in an urgent care or emergency department. Patient verbalized understanding and has no further questions.    Sandie Ano, RN

## 2020-09-08 ENCOUNTER — Other Ambulatory Visit: Payer: Self-pay | Admitting: Student

## 2020-09-08 DIAGNOSIS — E7800 Pure hypercholesterolemia, unspecified: Secondary | ICD-10-CM

## 2020-09-08 MED ORDER — ROSUVASTATIN CALCIUM 20 MG PO TABS: 20.0000 mg | ORAL_TABLET | Freq: Every day | ORAL | 2 refills | Status: DC

## 2020-09-08 NOTE — Telephone Encounter (Signed)
From patient.

## 2020-09-10 ENCOUNTER — Emergency Department (HOSPITAL_BASED_OUTPATIENT_CLINIC_OR_DEPARTMENT_OTHER)
Admission: EM | Admit: 2020-09-10 | Discharge: 2020-09-11 | Disposition: A | Discharge status: Home / Self Care | Payer: BC Managed Care – PPO | Attending: Emergency Medicine | Admitting: Emergency Medicine

## 2020-09-10 ENCOUNTER — Emergency Department (HOSPITAL_BASED_OUTPATIENT_CLINIC_OR_DEPARTMENT_OTHER): Payer: BC Managed Care – PPO

## 2020-09-10 ENCOUNTER — Other Ambulatory Visit: Payer: Self-pay

## 2020-09-10 ENCOUNTER — Encounter (HOSPITAL_BASED_OUTPATIENT_CLINIC_OR_DEPARTMENT_OTHER): Payer: Self-pay

## 2020-09-10 DIAGNOSIS — N132 Hydronephrosis with renal and ureteral calculous obstruction: Secondary | ICD-10-CM | POA: Insufficient documentation

## 2020-09-10 DIAGNOSIS — J45909 Unspecified asthma, uncomplicated: Secondary | ICD-10-CM | POA: Diagnosis not present

## 2020-09-10 DIAGNOSIS — I10 Essential (primary) hypertension: Secondary | ICD-10-CM | POA: Insufficient documentation

## 2020-09-10 DIAGNOSIS — R109 Unspecified abdominal pain: Secondary | ICD-10-CM | POA: Diagnosis present

## 2020-09-10 DIAGNOSIS — Z79899 Other long term (current) drug therapy: Secondary | ICD-10-CM | POA: Diagnosis not present

## 2020-09-10 DIAGNOSIS — N200 Calculus of kidney: Secondary | ICD-10-CM

## 2020-09-10 LAB — URINALYSIS, ROUTINE W REFLEX MICROSCOPIC
Bilirubin Urine: NEGATIVE
Glucose, UA: NEGATIVE mg/dL
Ketones, ur: NEGATIVE mg/dL
Leukocytes,Ua: NEGATIVE
Nitrite: NEGATIVE
Protein, ur: 30 mg/dL — AB
RBC / HPF: >50 RBC/hpf — ABNORMAL HIGH (ref 0–5)
Specific Gravity, Urine: 1.033 — ABNORMAL HIGH (ref 1.005–1.030)
pH: 5.5 (ref 5.0–8.0)

## 2020-09-10 LAB — PREGNANCY, URINE: Preg Test, Ur: NEGATIVE

## 2020-09-10 MED ORDER — DICLOFENAC SODIUM ER 100 MG PO TB24: 100.0000 mg | ORAL_TABLET | Freq: Every day | ORAL | 0 refills | Status: DC

## 2020-09-10 MED ORDER — TRAMADOL HCL 50 MG PO TABS: 50.0000 mg | ORAL_TABLET | Freq: Four times a day (QID) | ORAL | 0 refills | Status: DC | PRN

## 2020-09-10 MED ORDER — ONDANSETRON 8 MG PO TBDP: ORAL_TABLET | ORAL | 0 refills | Status: DC

## 2020-09-10 MED ORDER — TAMSULOSIN HCL 0.4 MG PO CAPS
0.4000 mg | ORAL_CAPSULE | ORAL | Status: AC
  Administered 2020-09-11: 0.4 mg via ORAL
  Filled 2020-09-10: qty 1

## 2020-09-10 MED ORDER — HYDROMORPHONE HCL 1 MG/ML IJ SOLN
1.0000 mg | Freq: Once | INTRAMUSCULAR | Status: AC
Start: 2020-09-10 — End: 2020-09-10
  Administered 2020-09-10: 1 mg via INTRAVENOUS
  Filled 2020-09-10: qty 1

## 2020-09-10 MED ORDER — ONDANSETRON HCL 4 MG/2ML IJ SOLN
4.0000 mg | Freq: Once | INTRAMUSCULAR | Status: AC
  Administered 2020-09-10: 4 mg via INTRAVENOUS
  Filled 2020-09-10: qty 2

## 2020-09-10 MED ORDER — KETOROLAC TROMETHAMINE 30 MG/ML IJ SOLN
30.0000 mg | Freq: Once | INTRAMUSCULAR | Status: AC
  Administered 2020-09-10: 30 mg via INTRAVENOUS
  Filled 2020-09-10: qty 1

## 2020-09-10 NOTE — ED Triage Notes (Signed)
Pt is present to the ED for right flank pain for the past few days. Pt was dx with kidney stones recently and was told to come in if the pain is worse. Pt states she does not think she has passed the stone and was not given any pain meds.

## 2020-09-10 NOTE — ED Provider Notes (Addendum)
[facility] EMERGENCY DEPT Provider Note   CSN: [id number] Arrival date & time: 09/10/20  2155     History Chief Complaint  Patient presents with  . Flank Pain    Elizabeth Yang is a 40 y.o. female.  Patient c/o right flank pain in past couple days. Symptoms acute onset, mod-severe, constant, persistent, dull, right flank posteriorly radiating towards right lower abd. Similar to prior kidney stone pain. No dysuria. No fever or chills. Nausea. No vomiting. Saw doctor yesterday and was prescribed antibiotic for possible uti (although that UA does not appear entirely c/w UTI).   The history is provided by the patient.  Flank Pain Pertinent negatives include no chest pain, no abdominal pain, no headaches and no shortness of breath.       Past Medical History:  Diagnosis Date  . Anxiety   . Asthma   . Blood per rectum 09/01/2020  . Boils 09/01/2020  . Constipation 09/01/2020  . Dehydration 09/01/2020  . Headache   . Hematuria 09/01/2020  . History of kidney stones   . Hypertension   . Hypertension affecting pregnancy, antepartum 09/13/2016  . Kidney stones 09/01/2020  . Loose stools 09/01/2020  . Nausea and vomiting 09/01/2020  . Vaginal Pap smear, abnormal   . Vitamin D deficiency     Patient Active Problem List   Diagnosis Date Noted  . Boils 09/01/2020  . Constipation 09/01/2020  . Loose stools 09/01/2020  . Blood per rectum 09/01/2020  . Kidney stones 09/01/2020  . Hematuria 09/01/2020  . Fatigue 09/01/2020  . Nausea and vomiting 09/01/2020  . Dehydration 09/01/2020  . Rectal prolapse 07/28/2020  . Internal hemorrhoids 07/28/2020  . Infectious colitis 07/28/2020  . Low back pain 07/11/2020  . PMDD (premenstrual dysphoric disorder) 04/27/2020  . Episode of recurrent major depressive disorder (HCC) 01/29/2020  . Insomnia 11/24/2019  . Generalized anxiety disorder 11/24/2019  . Obesity (BMI 30.0-34.9) 10/02/2019  . Daily headache 09/18/2019  .  Chronic hypertension affecting pregnancy 04/16/2018  . Postpartum care following cesarean delivery (11/13) 04/16/2018  . Cesarean delivery delivered 09/13/2016  . Cesarean section wound complications 09/27/2014    Past Surgical History:  Procedure Laterality Date  . CESAREAN SECTION N/A 09/27/2014   Procedure: CESAREAN SECTION;  Surgeon: Genia Del, MD;  Location: [facility] ORS;  Service: Obstetrics;  Laterality: N/A;  . CESAREAN SECTION N/A 09/13/2016   Procedure: Repeat CESAREAN SECTION;  Surgeon: Shea Evans, MD;  Location: [facility] BIRTHING SUITES;  Service: Obstetrics;  Laterality: N/A;  EDD: 09/26/16   . CESAREAN SECTION N/A 04/16/2018   Procedure: Repeat CESAREAN SECTION;  Surgeon: Shea Evans, MD;  Location: [facility] BIRTHING SUITES;  Service: Obstetrics;  Laterality: N/A;  EDD: 04/23/18  . TONSILLECTOMY    . WISDOM TOOTH EXTRACTION       OB History    Gravida  3   Para  3   Term  2   Preterm  1   AB  0   Living  3     SAB  0   IAB  0   Ectopic  0   Multiple  0   Live Births  3           Family History  Problem Relation Age of Onset  . Heart disease Mother   . Hypertension Mother   . Heart attack Mother   . Kidney disease Sister   . Heart disease Maternal Aunt   . Heart disease Maternal Uncle   . Heart  attack Maternal Uncle   . Cancer Maternal Grandmother   . Heart disease Maternal Grandfather   . Diabetes Paternal Grandmother     Social History   Tobacco Use  . Smoking status: Never Smoker  . Smokeless tobacco: Never Used  Vaping Use  . Vaping Use: Never used  Substance Use Topics  . Alcohol use: Never  . Drug use: No    Home Medications Prior to Admission medications   Medication Sig Start Date End Date Taking? Authorizing Provider  albuterol (PROVENTIL HFA;VENTOLIN HFA) 108 (90 BASE) MCG/ACT inhaler Inhale 2 puffs into the lungs every 6 (six) hours as needed for wheezing or shortness of breath.    [provider]  amLODipine  (NORVASC) 5 MG tablet Take 1 tablet (5 mg total) by mouth daily. 08/11/20   Cantwell, Celeste C, PA-C  butalbital-acetaminophen-caffeine (FIORICET, ESGIC) 50-325-40 MG tablet Take 1 tablet by mouth 2 (two) times daily as needed for migraine.    [provider]  clonazePAM (KLONOPIN) 1 MG tablet Take by mouth.    [provider]  fluconazole (DIFLUCAN) 100 MG tablet Take by mouth. 08/22/20   [provider]  levonorgestrel-ethinyl estradiol (SRONYX) 0.1-20 MG-MCG tablet Take 1 tablet by mouth daily.    [provider]  Nebivolol HCl 20 MG TABS TAKE 1 TABLET BY MOUTH EVERY DAY 07/25/20   Yates Decamp, MD  ondansetron (ZOFRAN) 4 MG tablet Take 4 mg by mouth every 8 (eight) hours as needed. 08/09/20   [provider]  ondansetron (ZOFRAN-ODT) 4 MG disintegrating tablet Take by mouth. 08/30/20   [provider]  phentermine (ADIPEX-P) 37.5 MG tablet Take 37.5 mg by mouth daily. Patient not taking: Reported on 09/01/2020 08/19/20   [provider]  rosuvastatin (CRESTOR) 20 MG tablet Take 1 tablet (20 mg total) by mouth daily. 09/08/20   Cantwell, Celeste C, PA-C  sertraline (ZOLOFT) 100 MG tablet Take by mouth. 04/27/20   [provider]  tiZANidine (ZANAFLEX) 4 MG tablet Take by mouth. 06/27/20   [provider]  WEGOVY 2.4 MG/0.75ML SOAJ INJECT 1 MILILGRAM UNDER SKIN WEEKLY 08/19/20   [provider]    Allergies    Patient has no known allergies.  Review of Systems   Review of Systems  Constitutional: Negative for chills and fever.  HENT: Negative for sore throat.   Eyes: Negative for redness.  Respiratory: Negative for cough and shortness of breath.   Cardiovascular: Negative for chest pain.  Gastrointestinal: Positive for nausea. Negative for abdominal pain and vomiting.  Genitourinary: Positive for flank pain. Negative for dysuria and hematuria.  Musculoskeletal: Negative for back pain.       Right lower  back/flank pain  Skin: Negative for rash.  Neurological: Negative for headaches.  Hematological: Does not bruise/bleed easily.  Psychiatric/Behavioral: Negative for confusion.    Physical Exam Updated Vital Signs BP (!) 152/95 (BP Location: Right Arm)   Pulse 70   Temp 97.6 F (36.4 C) (Oral)   Resp 16   Ht 1.575 m (5\' 2" )   Wt 57 kg   LMP 09/05/2020   SpO2 100%   BMI 22.98 kg/m   Physical Exam Vitals and nursing note reviewed.  Constitutional:      Appearance: Normal appearance. She is well-developed.  HENT:     Head: Atraumatic.     Nose: Nose normal.     Mouth/Throat:     Mouth: Mucous membranes are moist.  Eyes:     General:  No scleral icterus.    Conjunctiva/sclera: Conjunctivae normal.  Neck:     Trachea: No tracheal deviation.  Cardiovascular:     Rate and Rhythm: Normal rate and regular rhythm.     Pulses: Normal pulses.     Heart sounds: Normal heart sounds. No murmur heard. No friction rub. No gallop.   Pulmonary:     Effort: Pulmonary effort is normal. No respiratory distress.     Breath sounds: Normal breath sounds.  Abdominal:     General: Bowel sounds are normal. There is no distension.     Palpations: Abdomen is soft.     Tenderness: There is no abdominal tenderness. There is no guarding.  Genitourinary:    Comments: No cva tenderness.  Musculoskeletal:        General: No swelling.     Cervical back: Normal range of motion and neck supple. No rigidity. No muscular tenderness.  Skin:    General: Skin is warm and dry.     Findings: No rash.  Neurological:     Mental Status: She is alert.     Comments: Alert, speech normal.   Psychiatric:        Mood and Affect: Mood normal.     ED Results / Procedures / Treatments   Labs (all labs ordered are listed, but only abnormal results are displayed) Results for orders placed or performed during the hospital encounter of 09/10/20  Urinalysis, Routine w reflex microscopic  Result Value Ref Range    Color, Urine YELLOW YELLOW   APPearance CLEAR CLEAR   Specific Gravity, Urine 1.033 (H) 1.005 - 1.030   pH 5.5 5.0 - 8.0   Glucose, UA NEGATIVE NEGATIVE mg/dL   Hgb urine dipstick MODERATE (A) NEGATIVE   Bilirubin Urine NEGATIVE NEGATIVE   Ketones, ur NEGATIVE NEGATIVE mg/dL   Protein, ur 30 (A) NEGATIVE mg/dL   Nitrite NEGATIVE NEGATIVE   Leukocytes,Ua NEGATIVE NEGATIVE   RBC / HPF >50 (H) 0 - 5 RBC/hpf   WBC, UA 6-10 0 - 5 WBC/hpf   Squamous Epithelial / LPF 0-5 0 - 5   Mucus PRESENT   Pregnancy, urine  Result Value Ref Range   Preg Test, Ur NEGATIVE NEGATIVE   EKG None  Radiology CT Renal Stone Study  Result Date: 09/10/2020 CLINICAL DATA:  Right-sided flank pain EXAM: CT ABDOMEN AND PELVIS WITHOUT CONTRAST TECHNIQUE: Multidetector CT imaging of the abdomen and pelvis was performed following the standard protocol without IV contrast. COMPARISON:  CT 07/16/2020 FINDINGS: Lower chest: Lung bases demonstrate no acute consolidation or effusion. Hepatobiliary: Vague hypodense focus in the left hepatic lobe likely corresponds to the enhancing lesion noted on the comparison CT. No gallstones, gallbladder wall thickening, or biliary dilatation. Pancreas: Unremarkable. No pancreatic ductal dilatation or surrounding inflammatory changes. Spleen: Normal in size without focal abnormality. Adrenals/Urinary Tract: Adrenal glands are normal. There are multiple intrarenal stones bilaterally, measuring up to 4 mm on the right and 4 mm on the left. Moderate right hydronephrosis and hydroureter, secondary to a 6 mm stone in the distal right ureter at or just proximal to the right UVJ. The bladder is nearly empty Stomach/Bowel: Stomach is within normal limits. Appendix appears normal. No evidence of bowel wall thickening, distention, or inflammatory changes. Vascular/Lymphatic: Nonaneurysmal aorta.  No suspicious nodes. Reproductive: Uterus and bilateral adnexa are unremarkable. Other: Negative for free  air or free fluid Musculoskeletal: Chronic bilateral pars defect at L5. No acute osseous abnormality. IMPRESSION: 1. Moderate right hydronephrosis and  hydroureter, secondary to a 6 mm stone in the distal right ureter at or just proximal to the right UVJ. 2. Multiple intrarenal stones bilaterally. Electronically Signed   By: Jasmine Pang M.D.   On: 09/10/2020 23:19    Procedures Procedures   Medications Ordered in ED Medications  HYDROmorphone (DILAUDID) injection 1 mg (has no administration in time range)  ondansetron (ZOFRAN) injection 4 mg (has no administration in time range)    ED Course  I have reviewed the triage vital signs and the nursing notes.  Pertinent labs & imaging results that were available during my care of the patient were reviewed by me and considered in my medical decision making (see chart for details).    MDM Rules/Calculators/A&P                         Labs sent. Imaging ordered.   Reviewed nursing notes and prior charts for additional history. UA from yesterday reviewed, no definite uti.   Dilaudid 1 mg iv. zofran iv.   Labs reviewed/interpreted by me - many rbcs.   CT pending.   Signed out to Dr Nicanor Alcon to check ct when resulted, recheck pt, and dispo appropriately.   CT reviewed/interpreted by me -  6 mm distal ureteral stone.    Final Clinical Impression(s) / ED Diagnoses Final diagnoses:  None    Rx / DC Orders ED Discharge Orders    None       Cathren Laine, MD 09/10/20 2256      Cathren Laine, MD 09/11/20 1521

## 2020-09-10 NOTE — Discharge Instructions (Signed)
It was our pleasure to provide your ER care today - we hope that you feel better.  Drink plenty of fluids/stay well hydrated.   Complete the course of your antibiotic.   Return to ER if worse, new symptoms, fevers, worsening, severe, or intractable pain, persistent vomiting, or other concern.   You were given pain medication in the ER - no driving for the next 6 hours.

## 2020-09-13 ENCOUNTER — Ambulatory Visit (HOSPITAL_COMMUNITY): Payer: BC Managed Care – PPO

## 2020-09-13 ENCOUNTER — Ambulatory Visit (HOSPITAL_COMMUNITY)
Admission: AD | Admit: 2020-09-13 | Discharge: 2020-09-13 | Disposition: A | Discharge status: Home / Self Care | Payer: BC Managed Care – PPO | Source: Ambulatory Visit | Attending: Urology | Admitting: Urology

## 2020-09-13 ENCOUNTER — Encounter (HOSPITAL_COMMUNITY): Payer: Self-pay | Admitting: Urology

## 2020-09-13 ENCOUNTER — Other Ambulatory Visit: Payer: Self-pay | Admitting: Urology

## 2020-09-13 ENCOUNTER — Ambulatory Visit (HOSPITAL_COMMUNITY): Payer: BC Managed Care – PPO | Admitting: Certified Registered Nurse Anesthetist

## 2020-09-13 ENCOUNTER — Encounter (HOSPITAL_COMMUNITY)
Admission: AD | Disposition: A | Discharge status: Home / Self Care | Payer: Self-pay | Source: Ambulatory Visit | Attending: Urology

## 2020-09-13 DIAGNOSIS — Z791 Long term (current) use of non-steroidal anti-inflammatories (NSAID): Secondary | ICD-10-CM | POA: Insufficient documentation

## 2020-09-13 DIAGNOSIS — Z79891 Long term (current) use of opiate analgesic: Secondary | ICD-10-CM | POA: Insufficient documentation

## 2020-09-13 DIAGNOSIS — N201 Calculus of ureter: Secondary | ICD-10-CM | POA: Insufficient documentation

## 2020-09-13 DIAGNOSIS — Z79899 Other long term (current) drug therapy: Secondary | ICD-10-CM | POA: Insufficient documentation

## 2020-09-13 DIAGNOSIS — N200 Calculus of kidney: Secondary | ICD-10-CM

## 2020-09-13 HISTORY — PX: CYSTOSCOPY/URETEROSCOPY/HOLMIUM LASER/STENT PLACEMENT: SHX6546

## 2020-09-13 SURGERY — CYSTOSCOPY/URETEROSCOPY/HOLMIUM LASER/STENT PLACEMENT
Anesthesia: General | Laterality: Right

## 2020-09-13 MED ORDER — KETOROLAC TROMETHAMINE 30 MG/ML IJ SOLN
INTRAMUSCULAR | Status: DC | PRN
  Administered 2020-09-13: 15 mg via INTRAVENOUS

## 2020-09-13 MED ORDER — LACTATED RINGERS IV SOLN: INTRAVENOUS | Status: DC

## 2020-09-13 MED ORDER — OXYCODONE HCL 5 MG/5ML PO SOLN: 5.0000 mg | Freq: Once | ORAL | Status: AC | PRN

## 2020-09-13 MED ORDER — TRAMADOL HCL 50 MG PO TABS: 50.0000 mg | ORAL_TABLET | Freq: Four times a day (QID) | ORAL | 0 refills | Status: DC | PRN

## 2020-09-13 MED ORDER — ONDANSETRON HCL 4 MG/2ML IJ SOLN
INTRAMUSCULAR | Status: DC | PRN
  Administered 2020-09-13: 4 mg via INTRAVENOUS

## 2020-09-13 MED ORDER — ACETAMINOPHEN 325 MG PO TABS: 325.0000 mg | ORAL_TABLET | ORAL | Status: DC | PRN

## 2020-09-13 MED ORDER — SUCCINYLCHOLINE CHLORIDE 20 MG/ML IJ SOLN
INTRAMUSCULAR | Status: DC | PRN
  Administered 2020-09-13: 50 mg via INTRAVENOUS

## 2020-09-13 MED ORDER — OXYCODONE HCL 5 MG PO TABS
ORAL_TABLET | ORAL | Status: AC
  Filled 2020-09-13: qty 1

## 2020-09-13 MED ORDER — PROPOFOL 10 MG/ML IV BOLUS
INTRAVENOUS | Status: DC | PRN
  Administered 2020-09-13: 100 mg via INTRAVENOUS
  Administered 2020-09-13: 200 mg via INTRAVENOUS

## 2020-09-13 MED ORDER — DEXAMETHASONE SODIUM PHOSPHATE 10 MG/ML IJ SOLN
INTRAMUSCULAR | Status: DC | PRN
  Administered 2020-09-13: 4 mg via INTRAVENOUS

## 2020-09-13 MED ORDER — IOHEXOL 300 MG/ML  SOLN
INTRAMUSCULAR | Status: DC | PRN
  Administered 2020-09-13: 6 mL

## 2020-09-13 MED ORDER — ACETAMINOPHEN 160 MG/5ML PO SOLN: 325.0000 mg | ORAL | Status: DC | PRN

## 2020-09-13 MED ORDER — PHENAZOPYRIDINE HCL 200 MG PO TABS: 200.0000 mg | ORAL_TABLET | Freq: Three times a day (TID) | ORAL | 0 refills | Status: DC | PRN

## 2020-09-13 MED ORDER — ONDANSETRON HCL 4 MG/2ML IJ SOLN
INTRAMUSCULAR | Status: AC
  Filled 2020-09-13: qty 2

## 2020-09-13 MED ORDER — SODIUM CHLORIDE 0.9 % IR SOLN
Status: DC | PRN
  Administered 2020-09-13: 3000 mL

## 2020-09-13 MED ORDER — DEXAMETHASONE SODIUM PHOSPHATE 10 MG/ML IJ SOLN
INTRAMUSCULAR | Status: AC
  Filled 2020-09-13: qty 1

## 2020-09-13 MED ORDER — CEFAZOLIN SODIUM-DEXTROSE 2-4 GM/100ML-% IV SOLN
INTRAVENOUS | Status: AC
  Filled 2020-09-13: qty 100

## 2020-09-13 MED ORDER — ONDANSETRON HCL 4 MG/2ML IJ SOLN: 4.0000 mg | Freq: Once | INTRAMUSCULAR | Status: DC | PRN

## 2020-09-13 MED ORDER — MIDAZOLAM HCL 2 MG/2ML IJ SOLN
INTRAMUSCULAR | Status: AC
  Filled 2020-09-13: qty 2

## 2020-09-13 MED ORDER — OXYCODONE HCL 5 MG PO TABS
5.0000 mg | ORAL_TABLET | Freq: Once | ORAL | Status: AC | PRN
  Administered 2020-09-13: 5 mg via ORAL

## 2020-09-13 MED ORDER — FENTANYL CITRATE (PF) 100 MCG/2ML IJ SOLN
INTRAMUSCULAR | Status: AC
  Filled 2020-09-13: qty 2

## 2020-09-13 MED ORDER — CIPROFLOXACIN HCL 500 MG PO TABS: 500.0000 mg | ORAL_TABLET | Freq: Once | ORAL | 0 refills | Status: AC

## 2020-09-13 MED ORDER — CEFAZOLIN SODIUM-DEXTROSE 2-4 GM/100ML-% IV SOLN
2.0000 g | INTRAVENOUS | Status: AC
  Administered 2020-09-13: 2 g via INTRAVENOUS

## 2020-09-13 MED ORDER — LIDOCAINE 2% (20 MG/ML) 5 ML SYRINGE
INTRAMUSCULAR | Status: DC | PRN
  Administered 2020-09-13: 40 mg via INTRAVENOUS
  Administered 2020-09-13: 60 mg via INTRAVENOUS

## 2020-09-13 MED ORDER — FENTANYL CITRATE (PF) 100 MCG/2ML IJ SOLN: 25.0000 ug | INTRAMUSCULAR | Status: DC | PRN

## 2020-09-13 MED ORDER — MEPERIDINE HCL 50 MG/ML IJ SOLN: 6.2500 mg | INTRAMUSCULAR | Status: DC | PRN

## 2020-09-13 MED ORDER — MIDAZOLAM HCL 5 MG/5ML IJ SOLN
INTRAMUSCULAR | Status: DC | PRN
  Administered 2020-09-13: 2 mg via INTRAVENOUS

## 2020-09-13 MED ORDER — PROPOFOL 10 MG/ML IV BOLUS
INTRAVENOUS | Status: AC
  Filled 2020-09-13: qty 20

## 2020-09-13 MED ORDER — LIDOCAINE 2% (20 MG/ML) 5 ML SYRINGE
INTRAMUSCULAR | Status: AC
  Filled 2020-09-13: qty 5

## 2020-09-13 MED ORDER — BELLADONNA ALKALOIDS-OPIUM 16.2-60 MG RE SUPP
RECTAL | Status: DC | PRN
  Administered 2020-09-13: 1 via RECTAL

## 2020-09-13 MED ORDER — BELLADONNA ALKALOIDS-OPIUM 16.2-30 MG RE SUPP
RECTAL | Status: AC
  Filled 2020-09-13: qty 1

## 2020-09-13 MED ORDER — 0.9 % SODIUM CHLORIDE (POUR BTL) OPTIME
TOPICAL | Status: DC | PRN
  Administered 2020-09-13: 1000 mL

## 2020-09-13 SURGICAL SUPPLY — 17 items
BAG URO CATCHER STRL LF (MISCELLANEOUS) ×2 IMPLANT
BASKET ZERO TIP NITINOL 2.4FR (BASKET) IMPLANT
CATH URET 5FR 28IN OPEN ENDED (CATHETERS) ×2 IMPLANT
CLOTH BEACON ORANGE TIMEOUT ST (SAFETY) ×2 IMPLANT
EXTRACTOR STONE 1.7FRX115CM (UROLOGICAL SUPPLIES) ×2 IMPLANT
GLOVE SURG ENC TEXT LTX SZ7.5 (GLOVE) ×2 IMPLANT
GOWN STRL REUS W/TWL XL LVL3 (GOWN DISPOSABLE) ×2 IMPLANT
GUIDEWIRE ANG ZIPWIRE 038X150 (WIRE) IMPLANT
GUIDEWIRE STR DUAL SENSOR (WIRE) ×2 IMPLANT
KIT TURNOVER KIT A (KITS) ×2 IMPLANT
MANIFOLD NEPTUNE II (INSTRUMENTS) ×2 IMPLANT
PACK CYSTO (CUSTOM PROCEDURE TRAY) ×2 IMPLANT
SHEATH URETERAL 12FRX28CM (UROLOGICAL SUPPLIES) IMPLANT
SHEATH URETERAL 12FRX35CM (MISCELLANEOUS) IMPLANT
STENT POLARIS 5FRX24 (STENTS) ×2 IMPLANT
TUBING CONNECTING 10 (TUBING) ×2 IMPLANT
TUBING UROLOGY SET (TUBING) ×2 IMPLANT

## 2020-09-13 NOTE — Anesthesia Postprocedure Evaluation (Signed)
Anesthesia Post Note  Patient: Elizabeth Yang  Procedure(s) Performed: CYSTOSCOPY/URETEROSCOPYLASER LITHOTRISY/STENT PLACEMENT (Right )     Patient location during evaluation: PACU Anesthesia Type: General Level of consciousness: awake and alert Pain management: pain level controlled Vital Signs Assessment: post-procedure vital signs reviewed and stable Respiratory status: spontaneous breathing, nonlabored ventilation, respiratory function stable and patient connected to nasal cannula oxygen Cardiovascular status: blood pressure returned to baseline and stable Postop Assessment: no apparent nausea or vomiting Anesthetic complications: no   No complications documented.  Last Vitals:  Vitals:   09/13/20 1745 09/13/20 1807  BP: (!) 139/93 (!) 148/96  Pulse: 69 75  Resp: 15 16  Temp:  36.6 C  SpO2: 100% 100%    Last Pain:  Vitals:   09/13/20 1807  TempSrc:   PainSc: 3                  Jachob Mcclean

## 2020-09-13 NOTE — Transfer of Care (Signed)
Immediate Anesthesia Transfer of Care Note  Patient: Elizabeth Yang  Procedure(s) Performed: CYSTOSCOPY/URETEROSCOPYLASER LITHOTRISY/STENT PLACEMENT (Right )  Patient Location: PACU  Anesthesia Type:General  Level of Consciousness: awake  Airway & Oxygen Therapy: Patient Spontanous Breathing  Post-op Assessment: Report given to RN and Post -op Vital signs reviewed and stable  Post vital signs: Reviewed  Last Vitals:  Vitals Value Taken Time  BP 124/92 09/13/20 1723  Temp    Pulse 77 09/13/20 1724  Resp 16 09/13/20 1724  SpO2 100 % 09/13/20 1724  Vitals shown include unvalidated device data.  Last Pain:  Vitals:   09/13/20 1432  TempSrc:   PainSc: 3       Patients Stated Pain Goal: 3 (09/13/20 1432)  Complications: No complications documented.

## 2020-09-13 NOTE — H&P (Signed)
Acute Kidney Stone  HPI: Elizabeth Yang is a 40 year-old female patient who was referred by Dr. Gaspar Garbe, MD who is here for further eval and management of kidney stones.  She was diagnosed with a kidney stone on approximately 09/09/2020. The patient presented to [facility] with symptoms of a kidney stone.   Her pain started about approximately 08/23/2020. The pain is on the right side.   Abdomen/Pelvic CT: 19mm stone at right UVJ - bilateral non-obstructing stones. The patient underwent CT scan prior to today's appointment.   The patient relates initially having nausea, flank pain, and voiding symptoms. She has been taking tramadol/flomax. She has not caught a stone in her urine strainer since her symptoms began.   She has never had surgical treatment for calculi in the past. This is her first kidney stone.     ALLERGIES: None   MEDICATIONS: AMLODIPINE BESYLATE PO Daily  Birth Control Pill  BYSTOLIC PO Daily  CEFDINIR PO Daily  Crestor PO Daily  FIORICET PO Daily  Klonopin 1 mg tablet Oral  Multiple Vitamins tablet Oral  Tamsulosin HCL PO Daily  TIZANIDINE HCL PO PRN  TRAMADOL HCL PO Daily  Vicodin Hp 10 mg-660 mg tablet Oral  VOLTAREN ARTHRITIS PAIN PO Daily     GU PSH: None     PSH Notes: Tonsillectomy   NON-GU PSH: C-Section - about 04/04/2018 Remove Tonsils - 2010     GU PMH: Renal cyst - 07/05/2020      PMH Notes:  1898-06-04 00:00:00 - Note: Normal Routine History And Physical Adult  2008-09-10 11:59:47 - Note: Urinary Calculus Bilaterally   NON-GU PMH: Anxiety, Anxiety - 2014 Asthma, Asthma - 2014 Gastric ulcer, unspecified as acute or chronic, without hemorrhage or perforation, Gastric Ulcer - 2014 Personal history of other diseases of the circulatory system, History of hypertension - 2014 Personal history of other mental and behavioral disorders, History of depression - 2014 Personal history of other specified conditions, History of heartburn -  2014 Depression GERD Hypercholesterolemia Hypertension    FAMILY HISTORY: 3 Son's - Son Anxiety (Symptom) - Grandmother, Mother Breast Cancer - Grandmother Heart Attack - Grandfather, Uncle, Mother Heart Disease - Sister, Mother Hematuria - Sister Hypertension - Mother, Grandmother nephrolithiasis - Sister   SOCIAL HISTORY: Marital Status: Married Preferred Language: English; Ethnicity: Not Hispanic Or Latino; Race: White Current Smoking Status: Patient has never smoked.  Has not drank since 09/12/2013. Had 1 drinks per month. Drinks 1 caffeinated drink per day. Has not had a blood transfusion. Patient's occupation Pharmacist, community.     Notes: Tobacco Use, Alcohol Use, Occupation:, Caffeine Use   REVIEW OF SYSTEMS:    GU Review Female:   Patient reports frequent urination, hard to postpone urination, burning /pain with urination, get up at night to urinate, leakage of urine, stream starts and stops, and have to strain to urinate. Patient denies trouble starting your stream and being pregnant.  Gastrointestinal (Upper):   Patient reports nausea and vomiting. Patient denies indigestion/ heartburn.  Gastrointestinal (Lower):   Patient reports diarrhea and constipation.   Constitutional:   Patient reports weight loss and fatigue. Patient denies fever and night sweats.  Skin:   Patient denies skin rash/ lesion and itching.  Eyes:   Patient denies blurred vision and double vision.  Ears/ Nose/ Throat:   Patient denies sore throat and sinus problems.  Hematologic/Lymphatic:   Patient denies swollen glands and easy bruising.  Cardiovascular:   Patient denies leg swelling and chest  pains.  Respiratory:   Patient denies cough and shortness of breath.  Endocrine:   Patient denies excessive thirst.  Musculoskeletal:   Patient reports back pain. Patient denies joint pain.  Neurological:   Patient reports dizziness and headaches.   Psychologic:   Patient reports anxiety. Patient denies  depression.   VITAL SIGNS:      09/13/2020 08:25 AM  Weight 129 lb / 58.51 kg  Height 62 in / 157.48 cm  BP 135/93 mmHg  Pulse 73 /min  Temperature 97.3 F / 36.2 C  BMI 23.6 kg/m   MULTI-SYSTEM PHYSICAL EXAMINATION:    Constitutional: Well-nourished. No physical deformities. Normally developed. Good grooming.  Neck: Neck symmetrical, not swollen. Normal tracheal position.  Respiratory: Normal breath sounds. No labored breathing, no use of accessory muscles.   Cardiovascular: Regular rate and rhythm. No murmur, no gallop. Normal temperature, normal extremity pulses, no swelling, no varicosities.   Lymphatic: No enlargement of neck, axillae, groin.  Skin: No paleness, no jaundice, no cyanosis. No lesion, no ulcer, no rash.  Neurologic / Psychiatric: Oriented to time, oriented to place, oriented to person. No depression, no anxiety, no agitation.  Gastrointestinal: No mass, no tenderness, no rigidity, non obese abdomen.  Eyes: Normal conjunctivae. Normal eyelids.  Ears, Nose, Mouth, and Throat: Left ear no scars, no lesions, no masses. Right ear no scars, no lesions, no masses. Nose no scars, no lesions, no masses. Normal hearing. Normal lips.  Musculoskeletal: Normal gait and station of head and neck.     Complexity of Data:  Source Of History:  Patient  Records Review:   Previous Doctor Records, Previous Patient Records  Urine Test Review:   Urinalysis  X-Ray Review: C.T. Abdomen/Pelvis: Reviewed Films. Discussed With Patient.     PROCEDURES:          Urinalysis Dipstick Dipstick Cont'd  Color: Yellow Bilirubin: Neg mg/dL  Appearance: Clear Ketones: Neg mg/dL  Specific Gravity: 0.960 Blood: Neg ery/uL  pH: <=5.0 Protein: Neg mg/dL  Glucose: Neg mg/dL Urobilinogen: 0.2 mg/dL    Nitrites: Neg    Leukocyte Esterase: Neg leu/uL         Ketoralac 60mg  - , [id number] Qty: 60 Adm. By: [id number]  Unit: mg Lot No Lyndal Rainbow  Route: IM Exp. Date 02/02/2022  Freq: None Mfgr.:    Site: Right Hip   ASSESSMENT:      ICD-10 Details  1 GU:   Renal and ureteral calculus - N20.2    PLAN:           Schedule Return Visit/Planned Activity: ASAP - Schedule Surgery          Document Letter(s):  Created for Patient: Clinical Summary         Notes:   Patient has an obstructing large right UVJ stone. She has had lots of pain and issues now for 3 weeks. Fortunately she has no evidence of infection. We discussed treatment options and our plan is to take her to the operating room this afternoon to remove the stone. I went through the surgery in detail. She understands she is going to have a stent. Make the appropriate arrangements for her to have this done today.

## 2020-09-13 NOTE — OR Nursing (Signed)
Stone taken by Dr. Herrick 

## 2020-09-13 NOTE — Op Note (Signed)
Preoperative diagnosis: right ureteral calculus  Postoperative diagnosis: right ureteral calculus  Procedure:  1. Cystoscopy 2. right ureteroscopy and stone removal 3. Ureteroscopic laser lithotripsy 4. right 24F x 22cm ureteral stent placement  5. right retrograde pyelography with interpretation  Surgeon: Crist Fat, MD  Anesthesia: General  Complications: None  Intraoperative findings: right retrograde pyelography demonstrated a filling defect within the right ureter consistent with the patient's known calculus without other abnormalities.  EBL: Minimal  Specimens: 1. right ureteral calculus  Disposition of specimens: [facility] for stone analysis  Indication: Elizabeth Yang is a 40 y.o.   patient with a right ureteral stone and associated right symptoms. After reviewing the management options for treatment, the patient elected to proceed with the above surgical procedure(s). We have discussed the potential benefits and risks of the procedure, side effects of the proposed treatment, the likelihood of the patient achieving the goals of the procedure, and any potential problems that might occur during the procedure or recuperation. Informed consent has been obtained.   Description of procedure:  The patient was taken to the operating room and general anesthesia was induced.  The patient was placed in the dorsal lithotomy position, prepped and draped in the usual sterile fashion, and preoperative antibiotics were administered. A preoperative time-out was performed.   Cystourethroscopy was performed.  The patient's urethra was examined and was normal.  The bladder was then systematically examined in its entirety. There was no evidence for any bladder tumors, stones, or other mucosal pathology.    Attention then turned to the right ureteral orifice and a ureteral catheter was used to intubate the ureteral orifice.  Omnipaque contrast was injected through  the ureteral catheter and a retrograde pyelogram was performed with findings as dictated above.  A 0.38 sensor guidewire was then advanced up the right ureter into the renal pelvis under fluoroscopic guidance. The 6 Fr semirigid ureteroscope was then advanced into the ureter next to the guidewire and the calculus was identified.   The stone was then fragmented with the 365 micron holmium laser fiber on a setting of 1.0 and frequency of 10 Hz.   All stones were then removed from the ureter with an N-gage nitinol basket.  Reinspection of the ureter revealed no remaining visible stones or fragments.   The wire was then backloaded through the cystoscope and a ureteral stent was advance over the wire using Seldinger technique.  The stent was positioned appropriately under fluoroscopic and cystoscopic guidance.  The wire was then removed with an adequate stent curl noted in the renal pelvis as well as in the bladder.  The bladder was then emptied and the procedure ended.  The patient appeared to tolerate the procedure well and without complications.  The patient was able to be awakened and transferred to the recovery unit in satisfactory condition.   Disposition: The tether of the stent was left on and  tucked inside the patient's vagina.  Instructions for removing the stent have been provided to the patient. The patient has been scheduled for followup in 6 weeks with a renal ultrasound.

## 2020-09-13 NOTE — Anesthesia Preprocedure Evaluation (Signed)
Anesthesia Evaluation  Patient identified by MRN, date of birth, ID band Patient awake    Reviewed: Allergy & Precautions, NPO status , Patient's Chart, lab work & pertinent test results  Airway Mallampati: I  TM Distance: >3 FB Neck ROM: Full    Dental no notable dental hx. (+) Teeth Intact, Dental Advisory Given   Pulmonary asthma ,    Pulmonary exam normal breath sounds clear to auscultation       Cardiovascular hypertension, Pt. on medications negative cardio ROS Normal cardiovascular exam Rhythm:Regular Rate:Normal     Neuro/Psych  Headaches, PSYCHIATRIC DISORDERS Anxiety Depression    GI/Hepatic negative GI ROS, Neg liver ROS,   Endo/Other  negative endocrine ROS  Renal/GU ARFRenal disease  negative genitourinary   Musculoskeletal negative musculoskeletal ROS (+)   Abdominal   Peds negative pediatric ROS (+)  Hematology negative hematology ROS (+)   Anesthesia Other Findings   Reproductive/Obstetrics                             Anesthesia Physical  Anesthesia Plan  ASA: III  Anesthesia Plan: General   Post-op Pain Management:    Induction: Intravenous  PONV Risk Score and Plan: 3 and Ondansetron and Dexamethasone  Airway Management Planned: Natural Airway, LMA and Oral ETT  Additional Equipment:   Intra-op Plan:   Post-operative Plan: Extubation in OR  Informed Consent: I have reviewed the patients History and Physical, chart, labs and discussed the procedure including the risks, benefits and alternatives for the proposed anesthesia with the patient or authorized representative who has indicated his/her understanding and acceptance.     Dental advisory given  Plan Discussed with: CRNA and Anesthesiologist  Anesthesia Plan Comments: (  )        Anesthesia Quick Evaluation

## 2020-09-13 NOTE — Telephone Encounter (Signed)
Delice Bison, I hope you addressed this urgent message with one of my colleagues when I am away.

## 2020-09-13 NOTE — Discharge Instructions (Signed)
DISCHARGE INSTRUCTIONS FOR KIDNEY STONE/URETERAL STENT   MEDICATIONS:  1.  Resume all your other meds from home - except do not take any extra narcotic pain meds that you may have at home.  2. Pyridium is to help with the burning/stinging when you urinate. 3. Tramadol is for moderate/severe pain, otherwise taking upto 1000 mg every 6 hours of plainTylenol will help treat your pain.   4. Take Cipro one hour prior to removal of your stent.   ACTIVITY:  1. No strenuous activity x 1week  2. No driving while on narcotic pain medications  3. Drink plenty of water  4. Continue to walk at home - you can still get blood clots when you are at home, so keep active, but don't over do it.  5. May return to work/school tomorrow or when you feel ready   BATHING:  1. You can shower and we recommend daily showers  2. You have a string coming from your urethra: The stent string is attached to your ureteral stent. Do not pull on this.   SIGNS/SYMPTOMS TO CALL:  Please call us if you have a fever greater than 101.5, uncontrolled nausea/vomiting, uncontrolled pain, dizziness, unable to urinate, bloody urine, chest pain, shortness of breath, leg swelling, leg pain, redness around wound, drainage from wound, or any other concerns or questions.   You can reach [place] at [phone].   FOLLOW-UP:  1. You have an appointment in 6 weeks with a ultrasound of your kidneys prior.  2. You have a string attached to your stent, you may remove it on Thursday morning. To do this, pull the strings until the stents are completely removed. You may feel an odd sensation in your back.

## 2020-09-13 NOTE — Interval H&P Note (Signed)
History and Physical Interval Note:  09/13/2020 4:36 PM  Elizabeth Yang  has presented today for surgery, with the diagnosis of Right Ureteral Stone..  The various methods of treatment have been discussed with the patient and family. After consideration of risks, benefits and other options for treatment, the patient has consented to  Procedure(s): CYSTOSCOPY/URETEROSCOPYLASER LITHOTRISY/STENT PLACEMENT (Right) as a surgical intervention.  The patient's history has been reviewed, patient examined, no change in status, stable for surgery.  I have reviewed the patient's chart and labs.  Questions were answered to the patient's satisfaction.     Crist Fat

## 2020-09-13 NOTE — Anesthesia Procedure Notes (Signed)
Procedure Name: LMA Insertion °Performed by: Ileanna Gemmill H, CRNA °Pre-anesthesia Checklist: Patient identified, Emergency Drugs available, Suction available and Patient being monitored °Patient Re-evaluated:Patient Re-evaluated prior to induction °Oxygen Delivery Method: Circle System Utilized °Preoxygenation: Pre-oxygenation with 100% oxygen °Induction Type: IV induction °Ventilation: Mask ventilation without difficulty °LMA: LMA inserted °LMA Size: 4.0 °Number of attempts: 1 °Airway Equipment and Method: Bite block °Placement Confirmation: positive ETCO2 °Tube secured with: Tape °Dental Injury: Teeth and Oropharynx as per pre-operative assessment  ° ° ° ° ° ° °

## 2020-09-14 ENCOUNTER — Ambulatory Visit: Payer: BC Managed Care – PPO | Admitting: Physician Assistant

## 2020-09-14 ENCOUNTER — Encounter (HOSPITAL_COMMUNITY): Payer: Self-pay | Admitting: Urology

## 2020-09-22 NOTE — Progress Notes (Signed)
Primary Physician/Referring:  Jolinda Croak, MD  Patient ID: Elizabeth Yang, female    DOB: 03-25-1981, 40 y.o.   MRN: [id number]  Chief Complaint  Patient presents with  . Hypertension  . Follow-up   HPI:    Elizabeth Yang  is a 40 y.o. Caucasian female with mixed hyperlipdemia, mild obesity, hypertension diagnosed at age 40 years, also has strong family history of premature CAD, mother had an MI at age 64, she has 3 maternal uncles who developed heart disease in their 74s and 36s, and her maternal grandfather died at age 37 of an MI.  Patient presents for 6-week follow-up of blood pressure management.  Since last visit she is called with symptomatic hypotension and was advised to stop amlodipine.  Also presented to emergency department for/02/2021 and underwent removal of kidney stones. She continues to follow with GI for management of colitis.   She reports since removal of kiney stones she has been feeling significantly better. She has noticied a single episode of hypotension, but wsa asymptoatic at that time. In fact patient states she has noticed her home blood pressure readings have been increasing over 1 to 2 weeks.  She has therefore resumed amlodipine 5 mg daily and nebivolol 20 mg daily about 3 days ago.  Patient also states she has noticed brief episodes of sharp substernal pain lasting several seconds typically at rest over the last 1 to 2 weeks.  She also notes her sternum to be tender to palpation.  Denies dizziness, lightheadedness, syncope, near syncope.    Past Medical History:  Diagnosis Date  . Anxiety   . Asthma   . Blood per rectum 09/01/2020  . Boils 09/01/2020  . Constipation 09/01/2020  . Dehydration 09/01/2020  . Headache   . Hematuria 09/01/2020  . History of kidney stones   . Hypertension   . Hypertension affecting pregnancy, antepartum 09/13/2016  . Kidney stones 09/01/2020  . Loose stools 09/01/2020  . Nausea and vomiting 09/01/2020  . Vaginal  Pap smear, abnormal   . Vitamin D deficiency    Past Surgical History:  Procedure Laterality Date  . CESAREAN SECTION N/A 09/27/2014   Procedure: CESAREAN SECTION;  Surgeon: Princess Bruins, MD;  Location: [facility];  Service: Obstetrics;  Laterality: N/A;  . CESAREAN SECTION N/A 09/13/2016   Procedure: Repeat CESAREAN SECTION;  Surgeon: Azucena Fallen, MD;  Location: [facility];  Service: Obstetrics;  Laterality: N/A;  EDD: 09/26/16   . CESAREAN SECTION N/A 04/16/2018   Procedure: Repeat CESAREAN SECTION;  Surgeon: Azucena Fallen, MD;  Location: [facility];  Service: Obstetrics;  Laterality: N/A;  EDD: 04/23/18  . CYSTOSCOPY/URETEROSCOPY/HOLMIUM LASER/STENT PLACEMENT Right 09/13/2020   Procedure: CYSTOSCOPY/URETEROSCOPYLASER LITHOTRISY/STENT PLACEMENT;  Surgeon: Ardis Hughs, MD;  Location: [facility];  Service: Urology;  Laterality: Right;  . TONSILLECTOMY    . WISDOM TOOTH EXTRACTION     Family History  Problem Relation Age of Onset  . Heart disease Mother   . Hypertension Mother   . Heart attack Mother   . Kidney disease Sister   . Heart disease Maternal Aunt   . Heart disease Maternal Uncle   . Heart attack Maternal Uncle   . Cancer Maternal Grandmother   . Heart disease Maternal Grandfather   . Diabetes Paternal Grandmother     Social History   Tobacco Use  . Smoking status: Never Smoker  . Smokeless tobacco: Never Used  Substance Use Topics  . Alcohol use: Never  Marital Status: Married  ROS  Review of Systems  Constitutional: Negative for malaise/fatigue and weight gain.  Cardiovascular: Positive for chest pain. Negative for claudication, dyspnea on exertion, leg swelling, near-syncope, orthopnea, palpitations, paroxysmal nocturnal dyspnea and syncope.  Respiratory: Negative for shortness of breath.   Hematologic/Lymphatic: Does not bruise/bleed easily.  Gastrointestinal: Positive for abdominal pain. Negative for hematochezia and melena.   Neurological: Negative for dizziness and weakness.   Objective  Blood pressure 132/90, pulse 73, temperature 98.3 F (36.8 C), height '5\' 2"'  (1.575 m), weight 132 lb (59.9 kg), last menstrual period 09/05/2020, SpO2 99 %.  Vitals with BMI 09/23/2020 09/13/2020 09/13/2020  Height '5\' 2"'  - -  Weight 132 lbs - -  BMI 96.22 - -  Systolic 297 989 211  Diastolic 90 96 93  Pulse 73 75 69      Physical Exam Constitutional:      General: She is not in acute distress.    Appearance: Normal appearance. She is well-developed and normal weight.     Comments: Patient appears fatigued  Cardiovascular:     Rate and Rhythm: Normal rate and regular rhythm.     Pulses: Intact distal pulses.     Heart sounds: Normal heart sounds. No murmur heard. No gallop.      Comments: No leg edema, no JVD. Pulmonary:     Effort: Pulmonary effort is normal.     Breath sounds: Normal breath sounds.  Abdominal:     General: Bowel sounds are normal.     Palpations: Abdomen is soft.  Musculoskeletal:     Right lower leg: No edema.     Left lower leg: No edema.  Skin:    General: Skin is warm and dry.    Laboratory examination:   Recent Labs    07/15/20 1418  NA 137  K 3.9  CL 108  CO2 20*  GLUCOSE 111*  BUN 14  CREATININE 0.82  CALCIUM 9.1  GFRNONAA >60   CrCl cannot be calculated (Patient's most recent lab result is older than the maximum 21 days allowed.).  CMP Latest Ref Rng & Units 07/15/2020 07/31/2019 04/20/2018  Glucose 70 - 99 mg/dL 111(H) 103(H) 86  BUN 6 - 20 mg/dL '14 15 19  ' Creatinine 0.44 - 1.00 mg/dL 0.82 0.87 0.78  Sodium 135 - 145 mmol/L 137 137 137  Potassium 3.5 - 5.1 mmol/L 3.9 4.2 3.8  Chloride 98 - 111 mmol/L 108 105 107  CO2 22 - 32 mmol/L 20(L) 23 21(L)  Calcium 8.9 - 10.3 mg/dL 9.1 9.3 8.8(L)  Total Protein 6.5 - 8.1 g/dL 7.1 7.7 6.6  Total Bilirubin 0.3 - 1.2 mg/dL 0.9 0.7 0.4  Alkaline Phos 38 - 126 U/L 49 63 106  AST 15 - 41 U/L 8(L) 9(L) 23  ALT 0 - 44 U/L 15 17  59(H)   CBC Latest Ref Rng & Units 07/15/2020 07/31/2019 04/20/2018  WBC 4.0 - 10.5 K/uL 12.5(H) 5.5 7.4  Hemoglobin 12.0 - 15.0 g/dL 15.0 15.4(H) 12.0  Hematocrit 36.0 - 46.0 % 43.1 44.7 36.3  Platelets 150 - 400 K/uL 238 223 201    Lipid Panel     Component Value Date/Time   CHOL 115 07/01/2019 0845   TRIG 71 07/01/2019 0845   HDL 41 07/01/2019 0845   LDLCALC 59 07/01/2019 0845   LDLDIRECT 63 07/01/2019 0844   HEMOGLOBIN A1C No results found for: HGBA1C, MPG TSH No results for input(s): TSH in the last 8760 hours. Ref  Range & Units 05/18/2019   Vitamin D 1, 25 (OH)2 Total pg/mL 53    External labs:   12/31/2018: Serum glucose 100 g, BUN 17, creatinine 1.0, EGFR greater than 69, potassium 4.8.  Hb 15.3/HCT 44.8, platelets 216.  Normal indicis.   Total cholesterol 285, triglycerides 218, HDL 43, LDL 198.  Non-HDL cholesterol 242.  06/03/2015: Total cholesterol 288, triglycerides 245, HDL 62, LDL 177, serum glucose 107, creatinine 1.0, potassium 4.5, CMP otherwise normal.  Medications and allergies  No Known Allergies  Current Outpatient Medications on File Prior to Visit  Medication Sig Dispense Refill  . albuterol (PROVENTIL HFA;VENTOLIN HFA) 108 (90 BASE) MCG/ACT inhaler Inhale 2 puffs into the lungs every 6 (six) hours as needed for wheezing or shortness of breath.    Marland Kitchen amLODipine (NORVASC) 5 MG tablet Take 1 tablet (5 mg total) by mouth daily. 60 tablet 2  . butalbital-acetaminophen-caffeine (FIORICET, ESGIC) 50-325-40 MG tablet Take 1 tablet by mouth 2 (two) times daily as needed for migraine.    . clonazePAM (KLONOPIN) 1 MG tablet Take 1 mg by mouth as needed.    Marland Kitchen levonorgestrel-ethinyl estradiol (ALESSE) 0.1-20 MG-MCG tablet Take 1 tablet by mouth daily.    . Nebivolol HCl 20 MG TABS TAKE 1 TABLET BY MOUTH EVERY DAY 90 tablet 1  . ondansetron (ZOFRAN ODT) 8 MG disintegrating tablet 34m ODT q8 hours prn nausea 8 tablet 0  . rosuvastatin (CRESTOR) 20 MG tablet Take 1  tablet (20 mg total) by mouth daily. 30 tablet 2  . sertraline (ZOLOFT) 100 MG tablet Take by mouth.    .Marland KitchentiZANidine (ZANAFLEX) 4 MG tablet Take by mouth.     No current facility-administered medications on file prior to visit.    Radiology:   No results found.  Cardiac Studies:  Renal artery duplex 08/02/2020:  No evidence of renal artery occlusive disease in either renal artery.  Normal intrarenal vascular perfusion is noted in both kidneys.  Renal length is within normal limits for both kidneys.  Normal abdominal aorta flow velocities noted. No significant plaque burden  noted in the abdominal aorta.   Echocardiogram  [07/27/2015]:  Left ventricle cavity is normal in size. Normal global wall motion. Normal diastolic filling pattern. Calculated EF 61%. Trace mitral regurgitation. Trace tricuspid regurgitation. Unable to estimate PA pressure due to absence/minimal TR signal. Normal echocardiogram.  Treadmill stress test [08/01/2015]:  Indication: CP, Shortness of breath The patient exercised according to [facility] Protocol, Total time recorded 9:00 min achieving max heart rate of 164 which was 88% of MPHR for age and 10.16 METS of work. Normal BP response. Resting ECG showing NSR. There was no ST-T changes of ischemia with exercise stress test. No arrythmias noted. Stress terminated due to shortness of breath and THR >85% met. Excellent effort. Normal exercise stress test. Normal BP response.   EKG:    EKG 06/30/2020: Normal sinus rhythm at rate of 74 bpm, normal axis.  No evident ischemia, normal EKG.   Assessment     ICD-10-CM   1. Essential hypertension  I10   2. Orthostatic hypotension  I95.1     No orders of the defined types were placed in this encounter.   Medications Discontinued During This Encounter  Medication Reason  . fluconazole (DIFLUCAN) 100 MG tablet Error  . phenazopyridine (PYRIDIUM) 200 MG tablet Error  . traMADol (ULTRAM) 50 MG tablet Error  . WEGOVY  2.4 MG/0.75ML SOAJ Error  . Diclofenac Sodium CR 100 MG 24 hr  tablet Error    Recommendations:   Elizabeth Yang  is a  40 y.o. Caucasian female with mixed hyperlipdemia, mild obesity, hypertension diagnosed at age 89 years, also has strong family history of premature CAD, mother had an MI at age 58, she has 3 maternal uncles who developed heart disease in their 80s and 64s, and her maternal grandfather died at age 27 of an MI. Therapy for hyperlipidemia has been held due to patient lactating /child bearing.  Her 1st pregnancy in 2014 had bad outcomes due to uncontrolled hypertension, PET, she lost her 1st child.   Patient presents for 6-week follow-up of blood pressure management.  Since last visit she is called with symptomatic hypotension and was advised to stop amlodipine.  Also presented to emergency department for/02/2021 and underwent removal of kidney stones.  Patient symptoms have significantly improved since last visit.  Patient blood pressure is mildly elevated in the office today above goal, however she has only resumed amlodipine and nebivolol in the last 3 days.  We will therefore hold off on making changes to antihypertensive medications at this time, particularly in view of recent episodes of symptomatic hypotension.  Regard to precordial pain have a low suspicion is related to underlying CAD, however given patient's family history and risk factors shared decision was to proceed with coronary calcium score.  Follow-up in 6 weeks, sooner if needed, for hypertension and precordial pain.   Alethia Berthold, PA-C 09/23/2020, 4:32 PM Office: [phone]

## 2020-09-23 ENCOUNTER — Ambulatory Visit: Payer: BC Managed Care – PPO | Admitting: Student

## 2020-09-23 ENCOUNTER — Encounter: Payer: Self-pay | Admitting: Student

## 2020-09-23 ENCOUNTER — Other Ambulatory Visit: Payer: Self-pay | Admitting: Student

## 2020-09-23 ENCOUNTER — Other Ambulatory Visit: Payer: Self-pay

## 2020-09-23 VITALS — BP 132/90 | HR 73 | Temp 98.3°F | Ht 62.0 in | Wt 132.0 lb

## 2020-09-23 DIAGNOSIS — Z8249 Family history of ischemic heart disease and other diseases of the circulatory system: Secondary | ICD-10-CM

## 2020-09-23 DIAGNOSIS — I951 Orthostatic hypotension: Secondary | ICD-10-CM

## 2020-09-23 DIAGNOSIS — I10 Essential (primary) hypertension: Secondary | ICD-10-CM

## 2020-10-06 ENCOUNTER — Ambulatory Visit
Admission: RE | Admit: 2020-10-06 | Discharge: 2020-10-06 | Disposition: A | Discharge status: Home / Self Care | Payer: No Typology Code available for payment source | Source: Ambulatory Visit | Attending: Student | Admitting: Student

## 2020-10-06 DIAGNOSIS — Z8249 Family history of ischemic heart disease and other diseases of the circulatory system: Secondary | ICD-10-CM

## 2020-10-07 ENCOUNTER — Ambulatory Visit: Payer: BC Managed Care – PPO | Admitting: Physician Assistant

## 2020-10-11 ENCOUNTER — Ambulatory Visit: Payer: BC Managed Care – PPO | Admitting: Internal Medicine

## 2020-11-03 ENCOUNTER — Other Ambulatory Visit: Payer: Self-pay

## 2020-11-03 ENCOUNTER — Encounter: Payer: Self-pay | Admitting: Student

## 2020-11-03 ENCOUNTER — Ambulatory Visit: Payer: BC Managed Care – PPO | Admitting: Student

## 2020-11-03 VITALS — BP 119/83 | HR 84 | Temp 98.5°F | Resp 17 | Ht 62.0 in | Wt 137.0 lb

## 2020-11-03 DIAGNOSIS — E7800 Pure hypercholesterolemia, unspecified: Secondary | ICD-10-CM

## 2020-11-03 DIAGNOSIS — R931 Abnormal findings on diagnostic imaging of heart and coronary circulation: Secondary | ICD-10-CM

## 2020-11-03 DIAGNOSIS — R072 Precordial pain: Secondary | ICD-10-CM

## 2020-11-03 DIAGNOSIS — I951 Orthostatic hypotension: Secondary | ICD-10-CM

## 2020-11-03 DIAGNOSIS — I10 Essential (primary) hypertension: Secondary | ICD-10-CM

## 2020-11-03 MED ORDER — AMLODIPINE BESYLATE 10 MG PO TABS: 10.0000 mg | ORAL_TABLET | Freq: Every day | ORAL | 3 refills | Status: AC

## 2020-11-03 MED ORDER — ASPIRIN EC 81 MG PO TBEC: 81.0000 mg | DELAYED_RELEASE_TABLET | Freq: Every day | ORAL | 3 refills | Status: DC

## 2020-11-03 NOTE — Progress Notes (Signed)
Primary Physician/Referring:  Jolinda Croak, MD  Patient ID: Elizabeth Yang, female    DOB: [birth year], 40 y.o.   MRN: [id number]  Chief Complaint  Patient presents with  . Hypertension  . Follow-up    6 week  . Results    CA score   HPI:    Elizabeth Yang  is a 40 y.o. Caucasian female with mixed hyperlipdemia, mild obesity, hypertension diagnosed at age 40 years, also has strong family history of premature CAD, mother had an MI at age 31, she has 3 maternal uncles who developed heart disease in their 32s and 12s, and her maternal grandfather died at age 73 of an MI.  Patient presents for 6-week follow-up of hypertension and precordial pain.  At last visit patient had resumed amlodipine and nebivolol 3 days prior to office visit.  Also in order to further risk stratify patient from cardiovascular standpoint ordered coronary calcium score.  Total coronary calcium score of 93.5 which is advanced for patient's age.  Patient reports she continues to have intermittent episodes of sternal chest pain both with exertion and at rest, typically these episodes are brief, but can last as long as several minutes.  Patient has had no recurrence of dizziness, lightheadedness, syncope, or near syncope.  In fact she has noted her blood pressure has trended up since last visit.  She therefore increased amlodipine from 5 mg to 10 mg daily approximately 2 weeks ago.  Patient reports home blood pressure readings have shown improvement of systolic blood pressure, but reports diastolic blood pressure is often 90-43mHg.    Past Medical History:  Diagnosis Date  . Anxiety   . Asthma   . Blood per rectum 09/01/2020  . Boils 09/01/2020  . Constipation 09/01/2020  . Dehydration 09/01/2020  . Headache   . Hematuria 09/01/2020  . History of kidney stones   . Hypertension   . Hypertension affecting pregnancy, antepartum 09/13/2016  . Kidney stones 09/01/2020  . Loose stools 09/01/2020  . Nausea and  vomiting 09/01/2020  . Vaginal Pap smear, abnormal   . Vitamin D deficiency    Past Surgical History:  Procedure Laterality Date  . CESAREAN SECTION N/A 09/27/2014   Procedure: CESAREAN SECTION;  Surgeon: MPrincess Bruins MD;  [facility];  Service: Obstetrics;  Laterality: N/A;  . CESAREAN SECTION N/A 09/13/2016   Procedure: Repeat CESAREAN SECTION;  Surgeon: VAzucena Fallen MD;  [place] [facility]  Service: Obstetrics;  Laterality: N/A;  EDD: 09/26/16   . CESAREAN SECTION N/A 04/16/2018   Procedure: Repeat CESAREAN SECTION;  Surgeon: MAzucena Fallen MD;  [place] [facility]  Service: Obstetrics;  Laterality: N/A;  EDD: 04/23/18  . CYSTOSCOPY/URETEROSCOPY/HOLMIUM LASER/STENT PLACEMENT Right 09/13/2020   Procedure: CYSTOSCOPY/URETEROSCOPYLASER LITHOTRISY/STENT PLACEMENT;  Surgeon: HArdis Hughs MD;  [facility];  Service: Urology;  Laterality: Right;  . KIDNEY STONE SURGERY    . TONSILLECTOMY    . WISDOM TOOTH EXTRACTION     Family History  Problem Relation Age of Onset  . Heart disease Mother   . Hypertension Mother   . Heart attack Mother   . Kidney disease Sister   . Heart disease Maternal Aunt   . Heart disease Maternal Uncle   . Heart attack Maternal Uncle   . Cancer Maternal Grandmother   . Heart disease Maternal Grandfather   . Diabetes Paternal Grandmother     Social History   Tobacco Use  . Smoking status: Never Smoker  .  Smokeless tobacco: Never Used  Substance Use Topics  . Alcohol use: Never   Marital Status: Married  ROS  Review of Systems  Constitutional: Negative for malaise/fatigue and weight gain.  Cardiovascular: Positive for chest pain. Negative for claudication, dyspnea on exertion, leg swelling, near-syncope, orthopnea, palpitations, paroxysmal nocturnal dyspnea and syncope.  Respiratory: Negative for shortness of breath.   Hematologic/Lymphatic: Does not bruise/bleed easily.  Gastrointestinal: Positive for  abdominal pain (improved). Negative for hematochezia and melena.  Neurological: Negative for dizziness and weakness.   Objective  Blood pressure 119/83, pulse 84, temperature 98.5 F (36.9 C), resp. rate 17, height _0  (1.575 m), weight 137 lb (62.1 kg), SpO2 97 %.  Vitals with BMI 11/03/2020 09/23/2020 09/13/2020  Height _1  _2  -  Weight 137 lbs 132 lbs -  BMI 21.22 48.25 -  Systolic 003 704 888  Diastolic 83 90 96  Pulse 84 73 75      Physical Exam Vitals reviewed.  Constitutional:      General: She is not in acute distress.    Appearance: Normal appearance. She is well-developed and normal weight.  Cardiovascular:     Rate and Rhythm: Normal rate and regular rhythm.     Pulses: Intact distal pulses.     Heart sounds: Normal heart sounds, S1 normal and S2 normal. No murmur heard. No gallop.      Comments: No leg edema, no JVD. Pulmonary:     Effort: Pulmonary effort is normal. No respiratory distress.     Breath sounds: Normal breath sounds. No wheezing, rhonchi or rales.  Musculoskeletal:     Right lower leg: No edema.     Left lower leg: No edema.  Skin:    General: Skin is warm and dry.  Neurological:     Mental Status: She is alert.    Laboratory examination:   Recent Labs    07/15/20 1418  NA 137  K 3.9  CL 108  CO2 20*  GLUCOSE 111*  BUN 14  CREATININE 0.82  CALCIUM 9.1  GFRNONAA >60   CrCl cannot be calculated (Patient's most recent lab result is older than the maximum 21 days allowed.).  CMP Latest Ref Rng & Units 07/15/2020 07/31/2019 04/20/2018  Glucose 70 - 99 mg/dL 111(H) 103(H) 86  BUN 6 - 20 mg/dL _3 Creatinine 0.44 - 1.00 mg/dL 0.82 0.87 0.78  Sodium 135 - 145 mmol/L 137 137 137  Potassium 3.5 - 5.1 mmol/L 3.9 4.2 3.8  Chloride 98 - 111 mmol/L 108 105 107  CO2 22 - 32 mmol/L 20(L) 23 21(L)  Calcium 8.9 - 10.3 mg/dL 9.1 9.3 8.8(L)  Total Protein 6.5 - 8.1 g/dL 7.1 7.7 6.6  Total Bilirubin 0.3 - 1.2 mg/dL 0.9 0.7 0.4  Alkaline  Phos 38 - 126 U/L 49 63 106  AST 15 - 41 U/L 8(L) 9(L) 23  ALT 0 - 44 U/L 15 17 59(H)   CBC Latest Ref Rng & Units 07/15/2020 07/31/2019 04/20/2018  WBC 4.0 - 10.5 K/uL 12.5(H) 5.5 7.4  Hemoglobin 12.0 - 15.0 g/dL 15.0 15.4(H) 12.0  Hematocrit 36.0 - 46.0 % 43.1 44.7 36.3  Platelets 150 - 400 K/uL 238 223 201    Lipid Panel     Component Value Date/Time   CHOL 115 07/01/2019 0845   TRIG 71 07/01/2019 0845   HDL 41 07/01/2019 0845   LDLCALC 59 07/01/2019 0845   LDLDIRECT 63 07/01/2019 0844   HEMOGLOBIN A1C No  results found for: HGBA1C, MPG TSH No results for input(s): TSH in the last 8760 hours. Ref Range & Units 05/18/2019   Vitamin D 1, 25 (OH)2 Total pg/mL 53    External labs:  08/30/2020: BUN 12, creatinine 0.80, GFR 96, sodium 141, alk phos 50, AST 11, ALT 14 Hemoglobin 14.8, hematocrit 44.4, MCV 92, platelet 234 TSH 1.48  06/24/2020: Total cholesterol 171, triglycerides 149, HDL 57, LDL 88  12/31/2018: Serum glucose 100 g, BUN 17, creatinine 1.0, EGFR greater than 69, potassium 4.8.  Hb 15.3/HCT 44.8, platelets 216.  Normal indicis.   Total cholesterol 285, triglycerides 218, HDL 43, LDL 198.  Non-HDL cholesterol 242.  06/03/2015: Total cholesterol 288, triglycerides 245, HDL 62, LDL 177, serum glucose 107, creatinine 1.0, potassium 4.5, CMP otherwise normal.  Medications and allergies  No Known Allergies  Current Outpatient Medications on File Prior to Visit  Medication Sig Dispense Refill  . albuterol (PROVENTIL HFA;VENTOLIN HFA) 108 (90 BASE) MCG/ACT inhaler Inhale 2 puffs into the lungs every 6 (six) hours as needed for wheezing or shortness of breath.    . butalbital-acetaminophen-caffeine (FIORICET, ESGIC) 50-325-40 MG tablet Take 1 tablet by mouth 2 (two) times daily as needed for migraine.    . clonazePAM (KLONOPIN) 1 MG tablet Take 1 mg by mouth as needed.    Marland Kitchen levonorgestrel-ethinyl estradiol (ALESSE) 0.1-20 MG-MCG tablet Take 1 tablet by mouth daily.    .  Nebivolol HCl 20 MG TABS TAKE 1 TABLET BY MOUTH EVERY DAY 90 tablet 1  . ondansetron (ZOFRAN ODT) 8 MG disintegrating tablet 54m ODT q8 hours prn nausea 8 tablet 0  . rosuvastatin (CRESTOR) 20 MG tablet Take 1 tablet (20 mg total) by mouth daily. 30 tablet 2  . sertraline (ZOLOFT) 100 MG tablet Take by mouth.    .Marland KitchentiZANidine (ZANAFLEX) 4 MG tablet Take by mouth.     No current facility-administered medications on file prior to visit.    Radiology:   No results found.  Cardiac Studies:  Coronary calcium score 509-May-2022 Left Main: 0 LAD: 25.5 LCx: 0 RCA: 68 Total Agatston Score: 93.5 MESA database percentile: M[employer] Corporationonly applies to patient's age 4752years and older. This patient shows age advanced coronary artery calcium. For instance, if this patient were 39years old she would be in the ninety-ninth percentile for age, race and gender matched comparison.  Renal artery duplex 08/02/2020:  No evidence of renal artery occlusive disease in either renal artery.  Normal intrarenal vascular perfusion is noted in both kidneys.  Renal length is within normal limits for both kidneys.  Normal abdominal aorta flow velocities noted. No significant plaque burden  noted in the abdominal aorta.   Echocardiogram  [07/27/2015]:  Left ventricle cavity is normal in size. Normal global wall motion. Normal diastolic filling pattern. Calculated EF 61%. Trace mitral regurgitation. Trace tricuspid regurgitation. Unable to estimate PA pressure due to absence/minimal TR signal. Normal echocardiogram.  Treadmill stress test [08/01/2015]:  Indication: CP, Shortness of breath The patient exercised according to Bruce Protocol, Total time recorded 9:00 min achieving max heart rate of 164 which was 88% of MPHR for age and 10.16 METS of work. Normal BP response. Resting ECG showing NSR. There was no ST-T changes of ischemia with exercise stress test. No arrythmias noted. Stress terminated due to shortness  of breath and THR >85% met. Excellent effort. Normal exercise stress test. Normal BP response.   EKG:    EKG 06/30/2020: Normal sinus rhythm at rate  of 74 bpm, normal axis.  No evident ischemia, normal EKG.   Assessment     ICD-10-CM   1. Essential hypertension  I10   2. Orthostatic hypotension  I95.1   3. Hypercholesteremia  E78.00 Lipid Panel With LDL/HDL Ratio  4. Precordial pain  R07.2 PCV MYOCARDIAL PERFUSION WO LEXISCAN    PCV ECHOCARDIOGRAM COMPLETE  5. Elevated coronary artery calcium score  R93.1     Meds ordered this encounter  Medications  . amLODipine (NORVASC) 10 MG tablet    Sig: Take 1 tablet (10 mg total) by mouth daily.    Dispense:  90 tablet    Refill:  3  . aspirin EC 81 MG tablet    Sig: Take 1 tablet (81 mg total) by mouth daily. Swallow whole.    Dispense:  90 tablet    Refill:  3    Medications Discontinued During This Encounter  Medication Reason  . amLODipine (NORVASC) 5 MG tablet Dose change    Recommendations:   Elizabeth Yang  is a  40 y.o. Caucasian female with mixed hyperlipdemia, mild obesity, hypertension diagnosed at age 68 years, also has strong family history of premature CAD, mother had an MI at age 20, she has 3 maternal uncles who developed heart disease in their 70s and 68s, and her maternal grandfather died at age 86 of an MI. Therapy for hyperlipidemia has been held due to patient lactating /child bearing.  Her 1st pregnancy in 2014 had bad outcomes due to uncontrolled hypertension, PET, she lost her 1st child.   Patient presents for 6-week follow-up of hypertension and precordial pain.  At last visit patient had resumed amlodipine and nebivolol 3 days prior to office visit.  Also in order to further risk stratify patient from cardiovascular standpoint ordered coronary calcium score.  Total coronary calcium score of 93.5 which is advanced for patient's age.  Reviewed and discussed with patient regarding results of coronary calcium  score, details above.  Given patient's ongoing symptoms of substernal chest pain as well as elevated coronary calcium score and family history of premature CAD, recommend proceeding with nuclear stress test and echocardiogram at this time.  Also recommend aggressive risk factor management, including strict LDL goal of <70.  I personally reviewed external records, lipid profile in January 2022 revealed borderline triglycerides at 149 and LDL of 88.  We will repeat lipid profile testing.  If lipids remain above goal could consider increasing atorvastatin to 40 mg daily.  Given patient's elevated coronary calcium score have also advised her to start taking aspirin 81 mg daily.  In regard to blood pressure, hypertension is well controlled in the office today.  Given patient's history of orthostatic hypotension do not recommend making changes to medications at this time.  Instead counseled patient regarding reducing salt intake.  Patient will decrease dietary salt intake and continue to monitor blood pressure on a regular basis.  We will reevaluate need for antihypertensive medication adjustments at next visit.  Unless there are significant abnormalities on cardiac testing recommend follow-up in 3 months for hypertension, hyperlipidemia, precordial pain, and risk factor management.   Alethia Berthold, PA-C 11/03/2020, 3:30 PM Office: [phone]

## 2020-11-16 ENCOUNTER — Ambulatory Visit: Payer: BC Managed Care – PPO

## 2020-11-16 ENCOUNTER — Other Ambulatory Visit: Payer: Self-pay

## 2020-11-16 DIAGNOSIS — R072 Precordial pain: Secondary | ICD-10-CM

## 2020-11-19 LAB — LIPID PANEL WITH LDL/HDL RATIO
Cholesterol, Total: 188 mg/dL (ref 100–199)
HDL: 67 mg/dL (ref >39)
LDL Chol Calc (NIH): 90 mg/dL (ref 0–99)
LDL/HDL Ratio: 1.3 ratio (ref 0.0–3.2)
Triglycerides: 184 mg/dL — ABNORMAL HIGH (ref 0–149)
VLDL Cholesterol Cal: 31 mg/dL (ref 5–40)

## 2020-11-23 NOTE — Progress Notes (Signed)
Please advise patient triglycerides remain high. Advise her to reduce sugar and carb intake. Also would recommend increasing atorvastatin to 40 mg daily. If patient amendable please send new prescription.

## 2020-11-24 NOTE — Progress Notes (Signed)
2nd attempt : Called patient, NA, LMAM

## 2020-11-24 NOTE — Progress Notes (Signed)
Called patient, NA, LMAM

## 2020-11-28 ENCOUNTER — Ambulatory Visit: Payer: BC Managed Care – PPO

## 2020-11-28 ENCOUNTER — Other Ambulatory Visit: Payer: Self-pay

## 2020-11-28 DIAGNOSIS — R072 Precordial pain: Secondary | ICD-10-CM

## 2020-12-04 ENCOUNTER — Other Ambulatory Visit: Payer: Self-pay | Admitting: Student

## 2020-12-04 DIAGNOSIS — E7800 Pure hypercholesterolemia, unspecified: Secondary | ICD-10-CM

## 2020-12-12 ENCOUNTER — Other Ambulatory Visit: Payer: BC Managed Care – PPO

## 2020-12-12 NOTE — Telephone Encounter (Signed)
From pt

## 2021-01-31 ENCOUNTER — Encounter: Payer: Self-pay | Admitting: Gastroenterology

## 2021-01-31 ENCOUNTER — Ambulatory Visit (INDEPENDENT_AMBULATORY_CARE_PROVIDER_SITE_OTHER): Payer: BC Managed Care – PPO | Admitting: Gastroenterology

## 2021-01-31 DIAGNOSIS — R194 Change in bowel habit: Secondary | ICD-10-CM

## 2021-01-31 DIAGNOSIS — K625 Hemorrhage of anus and rectum: Secondary | ICD-10-CM | POA: Diagnosis not present

## 2021-01-31 DIAGNOSIS — K623 Rectal prolapse: Secondary | ICD-10-CM

## 2021-01-31 NOTE — Progress Notes (Signed)
01/31/2021 Elizabeth Yang [id number] 1980/07/26   HISTORY OF PRESENT ILLNESS: This is a 40 year old female who is a patient of Dr. Marvell Fuller.  She was seen by me back in February and was found to have rectal prolapse.  She was referred to the surgeons and was seen by Dr. Maisie Fus.  No surgery was recommended.  She was advised to keep her bowels soft.  She says that she recently saw Dr. Maisie Fus again and she recommended that she have a colonoscopy.  She says that she had a rough couple of weeks back in July.  She had some type of GI bug that was prolonged and was having a lot of diarrhea with incontinence at nighttime.  She says that prior to that her bowel habits would be all over the place.  She says that she would eat something one day and it would give her diarrhea and then she ate the same thing the next day and would be fine.  She says that sometimes she feels like she really has to strain like there is a pocket on the inside where her stool is getting stuck.  She has seen a moderate amount of blood on the toilet paper at times.  This has occurred even when she has not been straining, etc.  She says that over the past couple of weeks her bowel habits have been about the best that they have been in quite some time.   Past Medical History:  Diagnosis Date   Anxiety    Asthma    Blood per rectum 09/01/2020   Boils 09/01/2020   Constipation 09/01/2020   Dehydration 09/01/2020   Headache    Hematuria 09/01/2020   History of kidney stones    Hypertension    Hypertension affecting pregnancy, antepartum 09/13/2016   Kidney stones 09/01/2020   Loose stools 09/01/2020   Nausea and vomiting 09/01/2020   Vaginal Pap smear, abnormal    Vitamin D deficiency    Past Surgical History:  Procedure Laterality Date   CESAREAN SECTION N/A 09/27/2014   Procedure: CESAREAN SECTION;  Surgeon: Genia Del, MD;  Location[facility];  Service: Obstetrics;  Laterality: N/A;   CESAREAN SECTION N/A 09/13/2016    Procedure: Repeat CESAREAN SECTION;  Surgeon: Shea Evans, MD;  [facility];  Service: Obstetrics;  Laterality: N/A;  EDD: 09/26/16    CESAREAN SECTION N/A 04/16/2018   Procedure: Repeat CESAREAN SECTION;  Surgeon: Shea Evans, MD;  Location: [facility];  Service: Obstetrics;  Laterality: N/A;  EDD: 04/23/18   CYSTOSCOPY/URETEROSCOPY/HOLMIUM LASER/STENT PLACEMENT Right 09/13/2020   Procedure: CYSTOSCOPY/URETEROSCOPYLASER LITHOTRISY/STENT PLACEMENT;  Surgeon: Crist Fat, MD;  Location: [facility];  Service: Urology;  Laterality: Right;   KIDNEY STONE SURGERY     TONSILLECTOMY     WISDOM TOOTH EXTRACTION      reports that she has never smoked. She has never used smokeless tobacco. She reports that she does not drink alcohol and does not use drugs. family history includes Cancer in her maternal grandmother; Diabetes in her paternal grandmother; Heart attack in her maternal uncle and mother; Heart disease in her maternal aunt, maternal grandfather, maternal uncle, and mother; Hypertension in her mother; Kidney disease in her sister. No Known Allergies    Outpatient Encounter Medications as of 01/31/2021  Medication Sig   albuterol (PROVENTIL HFA;VENTOLIN HFA) 108 (90 BASE) MCG/ACT inhaler Inhale 2 puffs into the lungs every 6 (six) hours as needed for wheezing or shortness of breath.  amLODipine (NORVASC) 10 MG tablet Take 1 tablet (10 mg total) by mouth daily.   aspirin EC 81 MG tablet Take 1 tablet (81 mg total) by mouth daily. Swallow whole.   butalbital-acetaminophen-caffeine (FIORICET, ESGIC) 50-325-40 MG tablet Take 1 tablet by mouth 2 (two) times daily as needed for migraine.   clonazePAM (KLONOPIN) 1 MG tablet Take 1 mg by mouth as needed.   cyclobenzaprine (FLEXERIL) 10 MG tablet Take by mouth.   gabapentin (NEURONTIN) 300 MG capsule Take by mouth.   levonorgestrel-ethinyl estradiol (ALESSE) 0.1-20 MG-MCG tablet Take 1 tablet by mouth daily.   Nebivolol  HCl 20 MG TABS TAKE 1 TABLET BY MOUTH EVERY DAY   ondansetron (ZOFRAN ODT) 8 MG disintegrating tablet 8mg  ODT q8 hours prn nausea   rosuvastatin (CRESTOR) 20 MG tablet TAKE 1 TABLET BY MOUTH EVERY DAY   sertraline (ZOLOFT) 100 MG tablet Take by mouth.   tiZANidine (ZANAFLEX) 4 MG tablet Take by mouth.   [DISCONTINUED] levonorgestrel-ethinyl estradiol (ALESSE) 0.1-20 MG-MCG tablet Take 1 tablet by mouth daily.   No facility-administered encounter medications on file as of 01/31/2021.     REVIEW OF SYSTEMS  : All other systems reviewed and negative except where noted in the History of Present Illness.   PHYSICAL EXAM: BP 110/64   Pulse 82   Ht 5\' 2"  (1.575 m)   Wt 147 lb (66.7 kg)   SpO2 98%   BMI 26.89 kg/m  General: Well developed white female in no acute distress Head: Normocephalic and atraumatic Eyes:  Sclerae anicteric, conjunctiva pink. Ears: Normal auditory acuity Lungs: Clear throughout to auscultation; no W/R/R. Heart: Regular rate and rhythm; no M/R/G. Abdomen: Soft, non-distended.  BS present.  Non-tender. Rectal:  Will be done at the time of colonoscopy. Musculoskeletal: Symmetrical with no gross deformities  Skin: No lesions on visible extremities Extremities: No edema  Neurological: Alert oriented x 4, grossly non-focal Psychological:  Alert and cooperative. Normal mood and affect  ASSESSMENT AND PLAN: *Rectal bleeding, rectal prolapse, change in bowel habits: Dr. 02/02/2021 recommended colonoscopy.  She says that the last couple weeks her bowel habits have been the most normal that they have been in a while.  She will have random diarrhea.  Has had nocturnal incontinence.  Will schedule colonoscopy with Dr. to rule out colitis, etc.  The risks, benefits, and alternatives to colonoscopy were discussed with the patient and she consents to proceed.  We discussed using MiraLAX daily if needed to keep her stools soft.  CC:  Maisie Fus, MD

## 2021-01-31 NOTE — Patient Instructions (Addendum)
If you are age 39 or younger, your body mass index should be between 19-25. Your Body mass index is 26.89 kg/m. If this is out of the aformentioned range listed, please consider follow up with your Primary Care Provider.  __________________________________________________________  The [facility] providers would like to encourage you to use [facility] to communicate with providers for non-urgent requests or questions.  Due to long hold times on the telephone, sending your provider a message by [facility] may be a faster and more efficient way to get a response.  Please allow 48 business hours for a response.  Please remember that this is for non-urgent requests.   You have been scheduled for a colonoscopy. Please follow written instructions given to you at your visit today.  Please pick up your prep supplies at the pharmacy within the next 1-3 days. If you use inhalers (even only as needed), please bring them with you on the day of your procedure.  Due to recent changes in healthcare laws, you may see the results of your imaging and laboratory studies on MyChart before your provider has had a chance to review them.  We understand that in some cases there may be results that are confusing or concerning to you. Not all laboratory results come back in the same time frame and the provider may be waiting for multiple results in order to interpret others.  Please give [place] 48 hours in order for your provider to thoroughly review all the results before contacting the office for clarification of your results.   Please purchase the following medications over the counter and take as directed:  START: Miralax one capful in 8 -10 ounces of water daily as needed.   Thank you for entrusting me with your care and choosing [facility].  Doug Sou, PA-C

## 2021-02-08 ENCOUNTER — Ambulatory Visit: Payer: BC Managed Care – PPO | Admitting: Student

## 2021-02-10 ENCOUNTER — Other Ambulatory Visit: Payer: Self-pay

## 2021-02-10 ENCOUNTER — Ambulatory Visit: Payer: BC Managed Care – PPO | Admitting: Student

## 2021-02-10 ENCOUNTER — Encounter: Payer: Self-pay | Admitting: Internal Medicine

## 2021-02-10 ENCOUNTER — Ambulatory Visit (AMBULATORY_SURGERY_CENTER): Payer: BC Managed Care – PPO | Admitting: Internal Medicine

## 2021-02-10 VITALS — BP 130/95 | HR 72 | Temp 97.7°F | Resp 15 | Ht 62.0 in | Wt 147.0 lb

## 2021-02-10 DIAGNOSIS — K625 Hemorrhage of anus and rectum: Secondary | ICD-10-CM

## 2021-02-10 DIAGNOSIS — K648 Other hemorrhoids: Secondary | ICD-10-CM | POA: Diagnosis not present

## 2021-02-10 DIAGNOSIS — R194 Change in bowel habit: Secondary | ICD-10-CM

## 2021-02-10 MED ORDER — FLEET ENEMA 7-19 GM/118ML RE ENEM
1.0000 | ENEMA | Freq: Once | RECTAL | Status: AC
  Administered 2021-02-10: 1 via RECTAL

## 2021-02-10 MED ORDER — SODIUM CHLORIDE 0.9 % IV SOLN: 500.0000 mL | Freq: Once | INTRAVENOUS | Status: DC

## 2021-02-10 NOTE — Progress Notes (Signed)
Report given to PACU, vss 

## 2021-02-10 NOTE — Patient Instructions (Addendum)
I found internal hemorrhoids that were source of rectal bleeding, I think.  All else looks ok.  As we discussed, Covid causes diarrhea and injures the bowel. Over time the bowel habits usually normalize.  If you have persistent problems, please return to see [place].  I appreciate the opportunity to care for you. Iva Boop, MD, [facility]  Resume previous diet Continue current medications Await pathology results  YOU HAD AN ENDOSCOPIC PROCEDURE TODAY AT THE [facility]:   Refer to the procedure report that was given to you for any specific questions about what was found during the examination.  If the procedure report does not answer your questions, please call your gastroenterologist to clarify.  If you requested that your care partner not be given the details of your procedure findings, then the procedure report has been included in a sealed envelope for you to review at your convenience later.  YOU SHOULD EXPECT: Some feelings of bloating in the abdomen. Passage of more gas than usual.  Walking can help get rid of the air that was put into your GI tract during the procedure and reduce the bloating. If you had a lower endoscopy (such as a colonoscopy or flexible sigmoidoscopy) you may notice spotting of blood in your stool or on the toilet paper. If you underwent a bowel prep for your procedure, you may not have a normal bowel movement for a few days.  Please Note:  You might notice some irritation and congestion in your nose or some drainage.  This is from the oxygen used during your procedure.  There is no need for concern and it should clear up in a day or so.  SYMPTOMS TO REPORT IMMEDIATELY:  Following lower endoscopy (colonoscopy or flexible sigmoidoscopy):  Excessive amounts of blood in the stool  Significant tenderness or worsening of abdominal pains  Swelling of the abdomen that is new, acute  Fever of 100F or higher  For urgent or emergent issues, a gastroenterologist  can be reached at any hour by calling [phone]. Do not use MyChart messaging for urgent concerns.   DIET:  We do recommend a small meal at first, but then you may proceed to your regular diet.  Drink plenty of fluids but you should avoid alcoholic beverages for 24 hours.  ACTIVITY:  You should plan to take it easy for the rest of today and you should NOT DRIVE or use heavy machinery until tomorrow (because of the sedation medicines used during the test).    FOLLOW UP: Our staff will call the number listed on your records 48-72 hours following your procedure to check on you and address any questions or concerns that you may have regarding the information given to you following your procedure. If we do not reach you, we will leave a message.  We will attempt to reach you two times.  During this call, we will ask if you have developed any symptoms of COVID 19. If you develop any symptoms (ie: fever, flu-like symptoms, shortness of breath, cough etc.) before then, please call [phone].  If you test positive for Covid 19 in the 2 weeks post procedure, please call and report this information to [place].    If any biopsies were taken you will be contacted by phone or by letter within the next 1-3 weeks.  Please call us at [phone] if you have not heard about the biopsies in 3 weeks.   SIGNATURES/CONFIDENTIALITY: You and/or your care partner have signed  paperwork which will be entered into your electronic medical record.  These signatures attest to the fact that that the information above on your After Visit Summary has been reviewed and is understood.  Full responsibility of the confidentiality of this discharge information lies with you and/or your care-partner.

## 2021-02-10 NOTE — Progress Notes (Signed)
Pt's states no medical or surgical changes since previsit or office visit.    Upon asking pt about results, pt states cloudy brown liquid coming out, no solid stool, Dr Leone Payor notified, order rec'd for fleets enema.  1045-enema administered per pt w RN present, pt instruct to hold in as long as possible and call RN when expelled, and not to flush the toilet  1100- cloudy brown water coming out, Dr Leone Payor states he will proceed with the colonoscopy

## 2021-02-10 NOTE — Op Note (Signed)
[facility] Patient Name: Elizabeth Yang Procedure Date: 02/10/2021 11:13 AM MRN: [id number] Endoscopist: Iva Boop , MD Age: 40 Referring MD:  Date of Birth: 1980-11-11 Gender: Female Account #: [id number] Procedure:                Colonoscopy Indications:              Rectal bleeding Medicines:                Propofol per Anesthesia, Monitored Anesthesia Care Procedure:                Pre-Anesthesia Assessment:                           - Prior to the procedure, a History and Physical                            was performed, and patient medications and                            allergies were reviewed. The patient's tolerance of                            previous anesthesia was also reviewed. The risks                            and benefits of the procedure and the sedation                            options and risks were discussed with the patient.                            All questions were answered, and informed consent                            was obtained. Prior Anticoagulants: The patient has                            taken no previous anticoagulant or antiplatelet                            agents. ASA Grade Assessment: II - A patient with                            mild systemic disease. After reviewing the risks                            and benefits, the patient was deemed in                            satisfactory condition to undergo the procedure.                           After obtaining informed consent, the colonoscope  was passed under direct vision. Throughout the                            procedure, the patient's blood pressure, pulse, and                            oxygen saturations were monitored continuously. The                            CF [id number] #[id number] was introduced through the anus                            and advanced to the the cecum, identified by                            appendiceal orifice  and ileocecal valve. The                            colonoscopy was performed without difficulty. The                            patient tolerated the procedure well. The quality                            of the bowel preparation was adequate. The                            ileocecal valve, appendiceal orifice, and rectum                            were photographed. The bowel preparation used was                            Miralax via split dose instruction. Scope In: 11:34:59 AM Scope Out: 11:46:12 AM Scope Withdrawal Time: 0 hours 7 minutes 19 seconds  Total Procedure Duration: 0 hours 11 minutes 13 seconds  Findings:                 The perianal and digital rectal examinations were                            normal.                           Internal hemorrhoids were found.                           The exam was otherwise without abnormality on                            direct and retroflexion views. Complications:            No immediate complications. Estimated Blood Loss:     Estimated blood loss: none. Impression:               - Internal hemorrhoids.                           -  The examination was otherwise normal on direct                            and retroflexion views.                           - No specimens collected. Recommendation:           - Patient has a contact number available for                            emergencies. The signs and symptoms of potential                            delayed complications were discussed with the                            patient. Return to normal activities tomorrow.                            Written discharge instructions were provided to the                            patient.                           - Resume previous diet.                           - Continue present medications.                           - Repeat colonoscopy in 10 years for screening                            purposes. Iva Boop, MD 02/10/2021  11:56:16 AM This report has been signed electronically.

## 2021-02-10 NOTE — Progress Notes (Signed)
History and Physical Interval Note:  02/10/2021 11:31 AM  Elizabeth Yang  has presented today for endoscopic procedure(s), with the diagnosis of  Encounter Diagnoses  Name Primary?   Rectal bleeding Yes   Change in bowel habits   .  The various methods of evaluation and treatment have been discussed with the patient and/or family. After consideration of risks, benefits and other options for treatment, the patient has consented to  the endoscopic procedure(s).   The patient's history has been reviewed, patient examined, no change in status, stable for endoscopic procedure(s).  I have reviewed the patient's chart and labs.  Questions were answered to the patient's satisfaction.     Iva Boop, MD, [facility]

## 2021-02-14 ENCOUNTER — Telehealth: Payer: Self-pay | Admitting: *Deleted

## 2021-02-14 NOTE — Telephone Encounter (Signed)
  Follow up Call-  Call back number 02/10/2021  Post procedure Call Back phone  # [phone]  Permission to leave phone message Yes  Some recent data might be hidden    No answer at 2nd attempt follow up phone call.  Left message on voicemail.

## 2021-02-14 NOTE — Telephone Encounter (Signed)
Message left

## 2021-03-02 ENCOUNTER — Other Ambulatory Visit: Payer: Self-pay | Admitting: Student

## 2021-03-02 DIAGNOSIS — E7800 Pure hypercholesterolemia, unspecified: Secondary | ICD-10-CM

## 2021-05-08 ENCOUNTER — Other Ambulatory Visit: Payer: Self-pay | Admitting: Cardiology

## 2021-05-08 DIAGNOSIS — I10 Essential (primary) hypertension: Secondary | ICD-10-CM

## 2021-05-24 ENCOUNTER — Other Ambulatory Visit: Payer: Self-pay | Admitting: Student

## 2021-05-24 DIAGNOSIS — E7800 Pure hypercholesterolemia, unspecified: Secondary | ICD-10-CM

## 2021-08-28 ENCOUNTER — Other Ambulatory Visit: Payer: Self-pay | Admitting: Student

## 2021-08-28 DIAGNOSIS — E7800 Pure hypercholesterolemia, unspecified: Secondary | ICD-10-CM

## 2021-09-17 ENCOUNTER — Other Ambulatory Visit: Payer: Self-pay | Admitting: Student

## 2021-09-17 DIAGNOSIS — E7800 Pure hypercholesterolemia, unspecified: Secondary | ICD-10-CM

## 2021-11-08 ENCOUNTER — Other Ambulatory Visit: Payer: Self-pay | Admitting: Student

## 2021-11-08 DIAGNOSIS — I10 Essential (primary) hypertension: Secondary | ICD-10-CM

## 2021-11-15 NOTE — Progress Notes (Signed)
Primary Physician/Referring:  Jolinda Croak, MD  Patient ID: Elizabeth Yang, female    DOB: 12-19-1980, 41 y.o.   MRN: [id number]  Chief Complaint  Patient presents with   Follow-up    3 MONTH   Hypertension   Results   HLD   elevated CA score   HPI:    Elizabeth Yang  is a 41 y.o. Caucasian female with mixed hyperlipdemia, mild obesity,  hypertension diagnosed at age 4 years, also has strong family history of premature CAD, mother had an MI at age 56, she has 3 maternal uncles who developed heart disease in their 56s and 37s, and her maternal grandfather died at age 13 of an MI.  Patient was last seen in our office 11/03/2020 at which time increased atorvastatin to 40 mg daily given that LDL remained >70 and advised patient to start aspirin 81 mg daily given elevated coronary calcium score.  Patient was advised at that time to follow-up in 3 months, unfortunately she was lost to follow-up until now.  She now presents for 1 year follow-up.  Patient is feeling well overall.  She did experience weight gain over the last few months and recently started [facility], since then weight has trended down approximately 10 pounds which she is congratulated for.  Denies dizziness, syncope, near syncope.  Denies chest pain, palpitations.  Past Medical History:  Diagnosis Date   Anxiety    Asthma    Blood per rectum 09/01/2020   Boils 09/01/2020   Constipation 09/01/2020   Dehydration 09/01/2020   Headache    Hematuria 09/01/2020   History of kidney stones    Hypertension    Hypertension affecting pregnancy, antepartum 09/13/2016   Kidney stones 09/01/2020   Loose stools 09/01/2020   Nausea and vomiting 09/01/2020   Vaginal Pap smear, abnormal    Vitamin D deficiency    Past Surgical History:  Procedure Laterality Date   CESAREAN SECTION N/A 09/27/2014   Procedure: CESAREAN SECTION;  Surgeon: Princess Bruins, MD;  Location: [facility];  Service: Obstetrics;  Laterality: N/A;   CESAREAN  SECTION N/A 09/13/2016   Procedure: Repeat CESAREAN SECTION;  Surgeon: Azucena Fallen, MD;  Location: [place];  Service: Obstetrics;  Laterality: N/A;  EDD: 09/26/16    CESAREAN SECTION N/A 04/16/2018   Procedure: Repeat CESAREAN SECTION;  Surgeon: Azucena Fallen, MD;  Location: [place];  Service: Obstetrics;  Laterality: N/A;  EDD: 04/23/18   COLONOSCOPY     CYSTOSCOPY/URETEROSCOPY/HOLMIUM LASER/STENT PLACEMENT Right 09/13/2020   Procedure: CYSTOSCOPY/URETEROSCOPYLASER LITHOTRISY/STENT PLACEMENT;  Surgeon: Ardis Hughs, MD;  Location: [facility];  Service: Urology;  Laterality: Right;   KIDNEY STONE SURGERY     TONSILLECTOMY     WISDOM TOOTH EXTRACTION     Family History  Problem Relation Age of Onset   Heart disease Mother    Hypertension Mother    Heart attack Mother    Kidney disease Sister    Cancer Maternal Grandmother    Heart disease Maternal Grandfather    Diabetes Paternal Grandmother    Heart disease Maternal Aunt    Heart disease Maternal Uncle    Heart attack Maternal Uncle    Stomach cancer Neg Hx    Colon cancer Neg Hx    Esophageal cancer Neg Hx    Pancreatic cancer Neg Hx     Social History   Tobacco Use   Smoking status: Never   Smokeless tobacco: Never  Substance Use Topics  Alcohol use: Not Currently   Marital Status: Married  ROS  Review of Systems  Constitutional: Negative for malaise/fatigue and weight gain.  Cardiovascular:  Negative for chest pain, claudication, dyspnea on exertion, leg swelling, near-syncope, orthopnea, palpitations, paroxysmal nocturnal dyspnea and syncope.  Neurological:  Negative for dizziness.   Objective  Blood pressure 116/80, pulse 91, temperature 98 F (36.7 C), temperature source Temporal, resp. rate 17, height '5\' 2"'  (1.575 m), weight 169 lb 9.6 oz (76.9 kg), SpO2 98 %.     11/16/2021    9:09 AM 02/10/2021   12:10 PM 02/10/2021   12:00 PM  Vitals with BMI  Height '5\' 2"'     Weight 169 lbs 10 oz     BMI 97.67    Systolic 341 937 902  Diastolic 80 95 86  Pulse 91 72 79      Physical Exam Vitals reviewed.  Constitutional:      General: She is not in acute distress.    Appearance: Normal appearance. She is well-developed and normal weight.  Cardiovascular:     Rate and Rhythm: Normal rate and regular rhythm.     Pulses: Intact distal pulses.     Heart sounds: Normal heart sounds, S1 normal and S2 normal. No murmur heard.    No gallop.     Comments: No leg edema, no JVD. Pulmonary:     Effort: Pulmonary effort is normal. No respiratory distress.     Breath sounds: Normal breath sounds. No wheezing, rhonchi or rales.  Musculoskeletal:     Right lower leg: No edema.     Left lower leg: No edema.  Skin:    General: Skin is warm and dry.  Neurological:     Mental Status: She is alert.   Physical exam unchanged compared to previous office visit.  Laboratory examination:   No results for input(s): "NA", "K", "CL", "CO2", "GLUCOSE", "BUN", "CREATININE", "CALCIUM", "GFRNONAA", "GFRAA" in the last 8760 hours.  CrCl cannot be calculated (Patient's most recent lab result is older than the maximum 21 days allowed.).     Latest Ref Rng & Units 07/15/2020    2:18 PM 07/31/2019    2:49 PM 04/20/2018    8:39 PM  CMP  Glucose 70 - 99 mg/dL 111  103  86   BUN 6 - 20 mg/dL '14  15  19   ' Creatinine 0.44 - 1.00 mg/dL 0.82  0.87  0.78   Sodium 135 - 145 mmol/L 137  137  137   Potassium 3.5 - 5.1 mmol/L 3.9  4.2  3.8   Chloride 98 - 111 mmol/L 108  105  107   CO2 22 - 32 mmol/L '20  23  21   ' Calcium 8.9 - 10.3 mg/dL 9.1  9.3  8.8   Total Protein 6.5 - 8.1 g/dL 7.1  7.7  6.6   Total Bilirubin 0.3 - 1.2 mg/dL 0.9  0.7  0.4   Alkaline Phos 38 - 126 U/L 49  63  106   AST 15 - 41 U/L '8  9  23   ' ALT 0 - 44 U/L 15  17  59       Latest Ref Rng & Units 07/15/2020    2:18 PM 07/31/2019    2:49 PM 04/20/2018    8:39 PM  CBC  WBC 4.0 - 10.5 K/uL 12.5  5.5  7.4   Hemoglobin 12.0 - 15.0 g/dL  15.0  15.4  12.0   Hematocrit 36.0 -  46.0 % 43.1  44.7  36.3   Platelets 150 - 400 K/uL 238  223  201     Lipid Panel     Component Value Date/Time   CHOL 188 11/18/2020 0907   TRIG 184 (H) 11/18/2020 0907   HDL 67 11/18/2020 0907   LDLCALC 90 11/18/2020 0907   LDLDIRECT 63 07/01/2019 0844   HEMOGLOBIN A1C No results found for: "HGBA1C", "MPG" TSH No results for input(s): "TSH" in the last 8760 hours. Ref Range & Units 05/18/2019   Vitamin D 1, 25 (OH)2 Total pg/mL 53    External labs:  08/25/2021: TSH 1.71 Total cholesterol 159, triglycerides 112, HDL 63, LDL 76 A1c 5.3% BUN 14, creatinine 0.89, GFR 84, sodium 138, potassium 4.0 Hgb 14.5, HCT 40.9, platelet 222  08/30/2020: BUN 12, creatinine 0.80, GFR 96, sodium 141, alk phos 50, AST 11, ALT 14 Hemoglobin 14.8, hematocrit 44.4, MCV 92, platelet 234 TSH 1.48  06/24/2020: Total cholesterol 171, triglycerides 149, HDL 57, LDL 88  12/31/2018: Serum glucose 100 g, BUN 17, creatinine 1.0, EGFR greater than 69, potassium 4.8.  Hb 15.3/HCT 44.8, platelets 216.  Normal indicis.   Total cholesterol 285, triglycerides 218, HDL 43, LDL 198.  Non-HDL cholesterol 242.  06/03/2015: Total cholesterol 288, triglycerides 245, HDL 62, LDL 177, serum glucose 107, creatinine 1.0, potassium 4.5, CMP otherwise normal.  Allergies  No Known Allergies    Medications Prior to Visit:   Outpatient Medications Prior to Visit  Medication Sig Dispense Refill   albuterol (PROVENTIL HFA;VENTOLIN HFA) 108 (90 BASE) MCG/ACT inhaler Inhale 2 puffs into the lungs every 6 (six) hours as needed for wheezing or shortness of breath.     amLODipine (NORVASC) 10 MG tablet Take 1 tablet (10 mg total) by mouth daily. 90 tablet 3   aspirin EC 81 MG tablet Take 1 tablet (81 mg total) by mouth daily. Swallow whole. 90 tablet 3   busPIRone (BUSPAR) 10 MG tablet Take by mouth.     butalbital-acetaminophen-caffeine (FIORICET, ESGIC) 50-325-40 MG tablet Take 1 tablet  by mouth 2 (two) times daily as needed for migraine.     cyclobenzaprine (FLEXERIL) 10 MG tablet Take by mouth.     gabapentin (NEURONTIN) 300 MG capsule Take by mouth.     Nebivolol HCl 20 MG TABS TAKE 1 TABLET BY MOUTH EVERY DAY 90 tablet 1   Potassium Citrate 15 MEQ (1620 MG) TBCR Take by mouth.     rosuvastatin (CRESTOR) 20 MG tablet TAKE 1 TABLET BY MOUTH EVERY DAY 90 tablet 0   sertraline (ZOLOFT) 100 MG tablet Take 2 tablets by mouth daily.     WEGOVY 2.4 MG/0.75ML SOAJ Inject 1 mg into the skin once a week.     sertraline (ZOLOFT) 100 MG tablet Take by mouth.     clonazePAM (KLONOPIN) 1 MG tablet Take 1 mg by mouth as needed.     EMGALITY 120 MG/ML SOAJ Inject into the skin.     Galcanezumab-gnlm (EMGALITY) 120 MG/ML SOAJ Inject into the skin every 30 (thirty) days.     levonorgestrel-ethinyl estradiol (ALESSE) 0.1-20 MG-MCG tablet Take 1 tablet by mouth daily.     ondansetron (ZOFRAN ODT) 8 MG disintegrating tablet 1m ODT q8 hours prn nausea 8 tablet 0   tiZANidine (ZANAFLEX) 4 MG tablet Take by mouth.     No facility-administered medications prior to visit.   Final Medications at End of Visit    Current Meds  Medication Sig   albuterol (  PROVENTIL HFA;VENTOLIN HFA) 108 (90 BASE) MCG/ACT inhaler Inhale 2 puffs into the lungs every 6 (six) hours as needed for wheezing or shortness of breath.   amLODipine (NORVASC) 10 MG tablet Take 1 tablet (10 mg total) by mouth daily.   aspirin EC 81 MG tablet Take 1 tablet (81 mg total) by mouth daily. Swallow whole.   busPIRone (BUSPAR) 10 MG tablet Take by mouth.   butalbital-acetaminophen-caffeine (FIORICET, ESGIC) 50-325-40 MG tablet Take 1 tablet by mouth 2 (two) times daily as needed for migraine.   cyclobenzaprine (FLEXERIL) 10 MG tablet Take by mouth.   gabapentin (NEURONTIN) 300 MG capsule Take by mouth.   Nebivolol HCl 20 MG TABS TAKE 1 TABLET BY MOUTH EVERY DAY   Potassium Citrate 15 MEQ (1620 MG) TBCR Take by mouth.    rosuvastatin (CRESTOR) 20 MG tablet TAKE 1 TABLET BY MOUTH EVERY DAY   sertraline (ZOLOFT) 100 MG tablet Take 2 tablets by mouth daily.   WEGOVY 2.4 MG/0.75ML SOAJ Inject 1 mg into the skin once a week.   [DISCONTINUED] sertraline (ZOLOFT) 100 MG tablet Take by mouth.    Radiology:   No results found.  Cardiac Studies:  Exercise Sestamibi stress test 11/28/2020: Exercise nuclear stress test was performed using Bruce protocol. Patient reached 13.4 METS, and 88% of age predicted maximum heart rate. Exercise capacity was excellent. No chest pain reported. Heart rate and hemodynamic response were normal. Stress EKG revealed no ischemic changes. Normal myocardial perfusion. Stress LVEF 56%. Low risk study.  Echocardiogram 11/16/2020:  Left ventricle cavity is normal in size and wall thickness. Normal global  wall motion. Normal LV systolic function with EF 61%. Normal diastolic  filling pattern.  Mild (Grade I) mitral regurgitation.  No evidence of pulmonary hypertension.  Coronary calcium score 10/06/2020: Left Main: 0 LAD: 25.5 LCx: 0 RCA: 68 Total Agatston Score: 93.5 MESA database percentile: [employer] only applies to patient's age 99 years and older. This patient shows age advanced coronary artery calcium. For instance, if this patient were 40 years old she would be in the ninety-ninth percentile for age, race and gender matched comparison.  Renal artery duplex  08/02/2020:  No evidence of renal artery occlusive disease in either renal artery.  Normal intrarenal vascular perfusion is noted in both kidneys.  Renal length is within normal limits for both kidneys.  Normal abdominal aorta flow velocities noted. No significant plaque burden  noted in the abdominal aorta.   EKG:  EKG 11/16/2021: Sinus rhythm and rate of 79 bpm.  Normal axis.  No evidence of ischemia or underlying injury pattern.  Compared EKG 06/30/2020, no significant change.  Assessment     ICD-10-CM   1.  Essential hypertension  I10 EKG 12-Lead    2. Orthostatic hypotension  I95.1     3. Elevated coronary artery calcium score  R93.1       No orders of the defined types were placed in this encounter.   Medications Discontinued During This Encounter  Medication Reason   sertraline (ZOLOFT) 454 MG tablet Duplicate   clonazePAM (KLONOPIN) 1 MG tablet Patient has not taken in last 30 days   EMGALITY 120 MG/ML SOAJ Patient has not taken in last 30 days   Galcanezumab-gnlm (EMGALITY) 120 MG/ML SOAJ Patient has not taken in last 30 days   levonorgestrel-ethinyl estradiol (ALESSE) 0.1-20 MG-MCG tablet Patient has not taken in last 30 days   ondansetron (ZOFRAN ODT) 8 MG disintegrating tablet Patient has not taken in last  30 days   tiZANidine (ZANAFLEX) 4 MG tablet Patient has not taken in last 30 days    Recommendations:   STEVIE ERTLE  is a  41 y.o. Caucasian female with mixed hyperlipdemia, mild obesity,  hypertension diagnosed at age 63 years, also has strong family history of premature CAD, mother had an MI at age 56, she has 3 maternal uncles who developed heart disease in their 17s and 38s, and her maternal grandfather died at age 47 of an MI. Therapy for hyperlipidemia has been held due to patient lactating /child bearing.  Her 1st pregnancy in 2014 had bad outcomes due to uncontrolled hypertension, PET, she lost her 1st child. Total coronary calcium score of 93.5 which is advanced for patient's age.  Patient was last seen in our office 11/03/2020 at which time increased atorvastatin to 40 mg daily given that LDL remained >70 and advised patient to start aspirin 81 mg daily given elevated coronary calcium score.  Patient was advised at that time to follow-up in 3 months, unfortunately she was lost to follow-up until now.  She now presents for 1 year follow-up.  Blood pressure is well controlled and patient is essentially asymptomatic.  I personally reviewed external labs, lipids are under  excellent control.  We will continue present medications.  Recommend continued strict risk factor modification for primary prevention.  Follow-up in 1 year, sooner if needed.    Alethia Berthold, PA-C 11/16/2021, 9:47 AM Office: [phone]

## 2021-11-16 ENCOUNTER — Encounter: Payer: Self-pay | Admitting: Student

## 2021-11-16 ENCOUNTER — Ambulatory Visit: Payer: BC Managed Care – PPO | Admitting: Student

## 2021-11-16 VITALS — BP 116/80 | HR 91 | Temp 98.0°F | Resp 17 | Ht 62.0 in | Wt 169.6 lb

## 2021-11-16 DIAGNOSIS — I10 Essential (primary) hypertension: Secondary | ICD-10-CM

## 2021-11-16 DIAGNOSIS — R931 Abnormal findings on diagnostic imaging of heart and coronary circulation: Secondary | ICD-10-CM

## 2021-11-16 DIAGNOSIS — I951 Orthostatic hypotension: Secondary | ICD-10-CM

## 2021-11-17 ENCOUNTER — Other Ambulatory Visit: Payer: Self-pay

## 2021-11-17 DIAGNOSIS — I10 Essential (primary) hypertension: Secondary | ICD-10-CM

## 2021-11-17 MED ORDER — NEBIVOLOL HCL 20 MG PO TABS: 1.0000 | ORAL_TABLET | Freq: Every day | ORAL | 1 refills | Status: DC

## 2021-12-18 ENCOUNTER — Other Ambulatory Visit: Payer: Self-pay | Admitting: Student

## 2021-12-18 DIAGNOSIS — E7800 Pure hypercholesterolemia, unspecified: Secondary | ICD-10-CM

## 2022-04-09 IMAGING — MG DIGITAL DIAGNOSTIC BILAT W/ TOMO W/ CAD
8 series · 8 of 24 positions shown · non-contrast
Comparison: None.

CLINICAL DATA: Recent palpable lump on the right which has
resolved.

EXAM:
DIGITAL DIAGNOSTIC BILATERAL MAMMOGRAM WITH CAD
ULTRASOUND RIGHT BREAST

[R CC synth-2D]
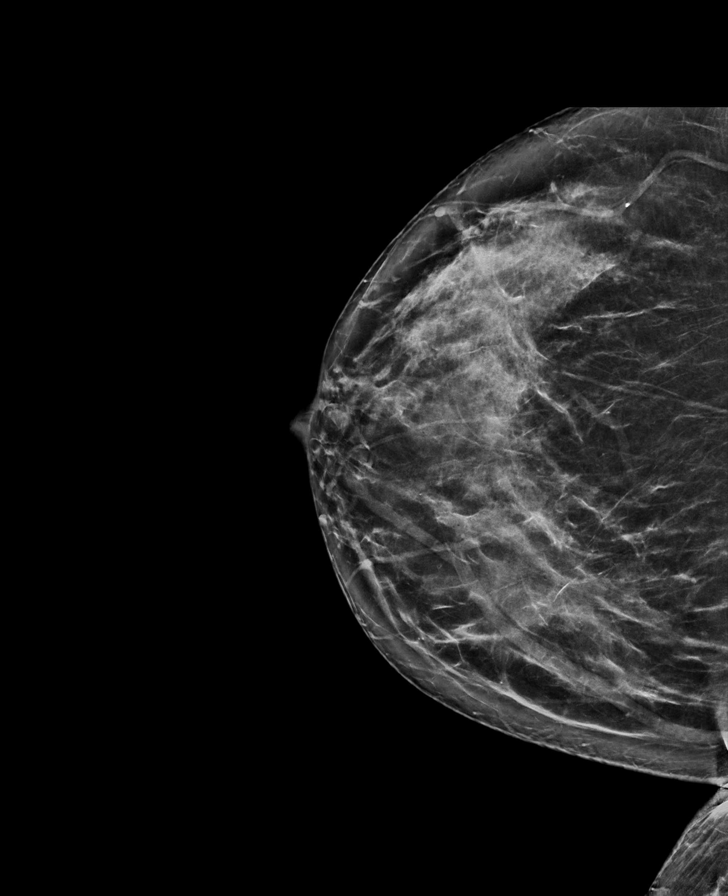

[L MLO synth-2D]
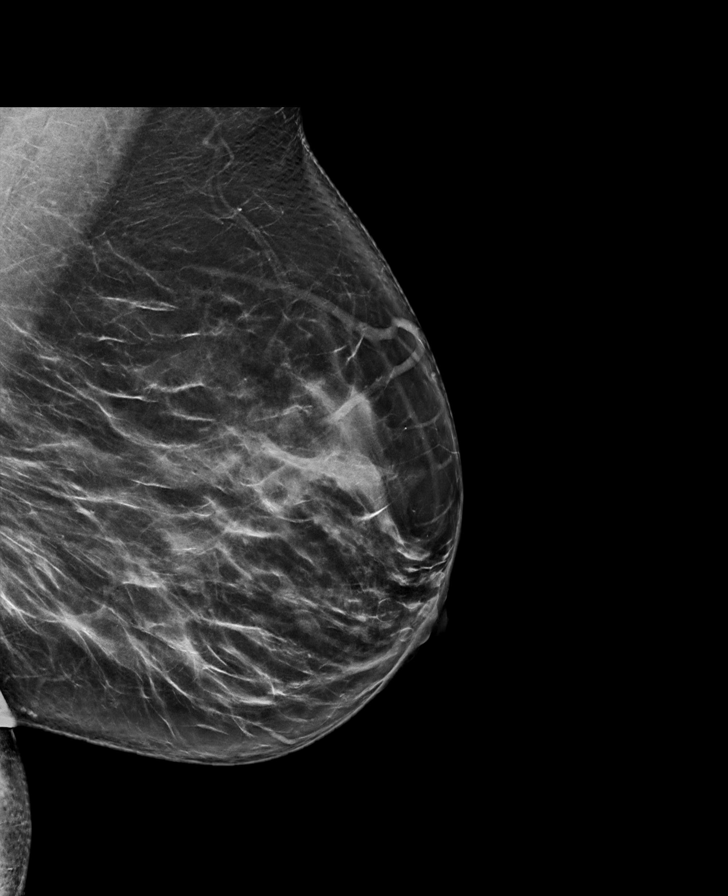

[R MLO synth-2D]
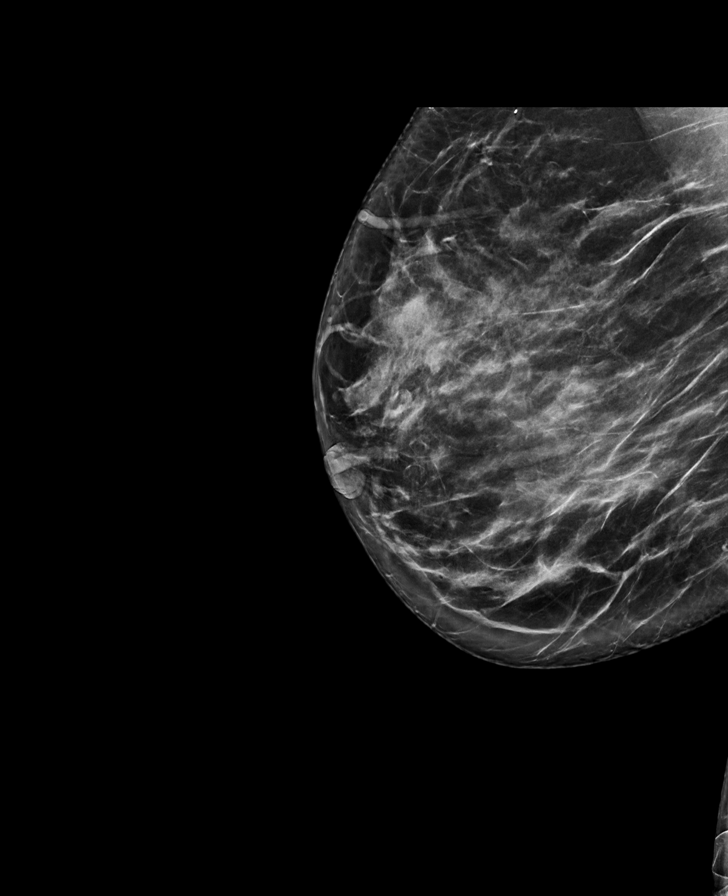

[L CC synth-2D]
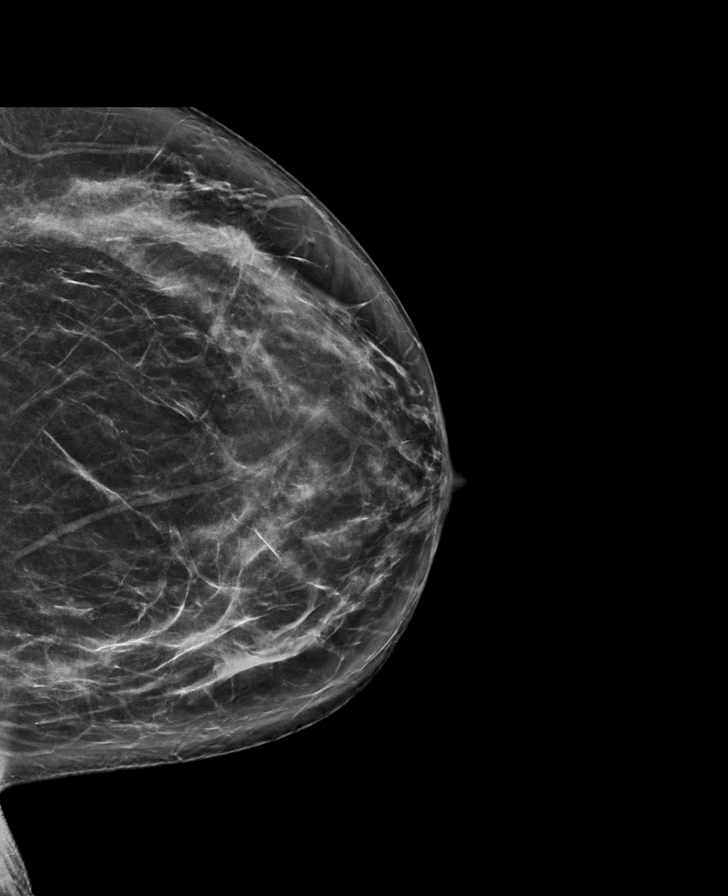

[R MLO tomo · tomo slice 43/84.0]
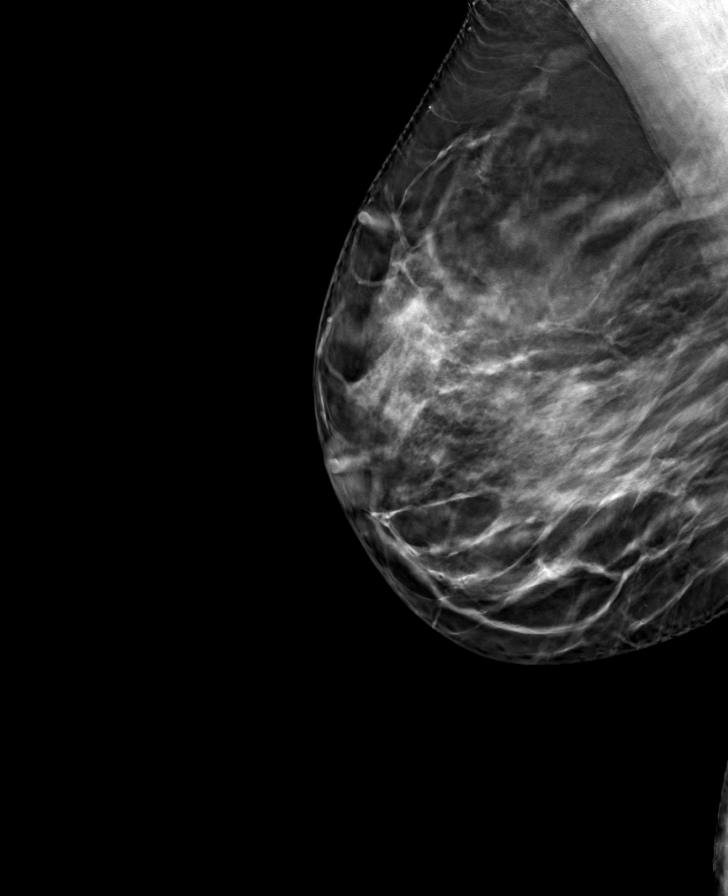

[L CC tomo · tomo slice 41/80.0]
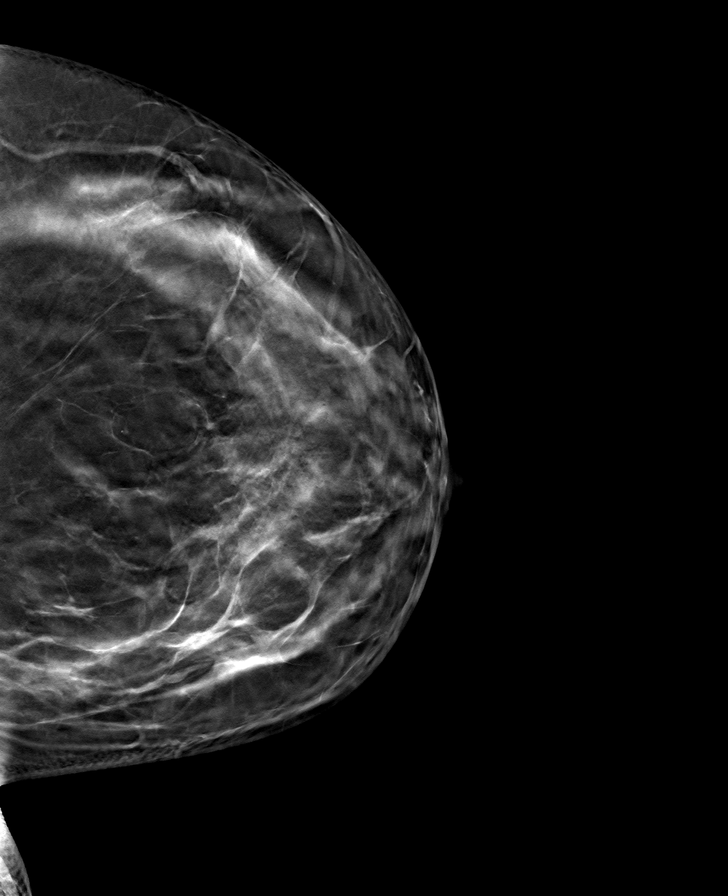

[R CC tomo · tomo slice 41/81.0]
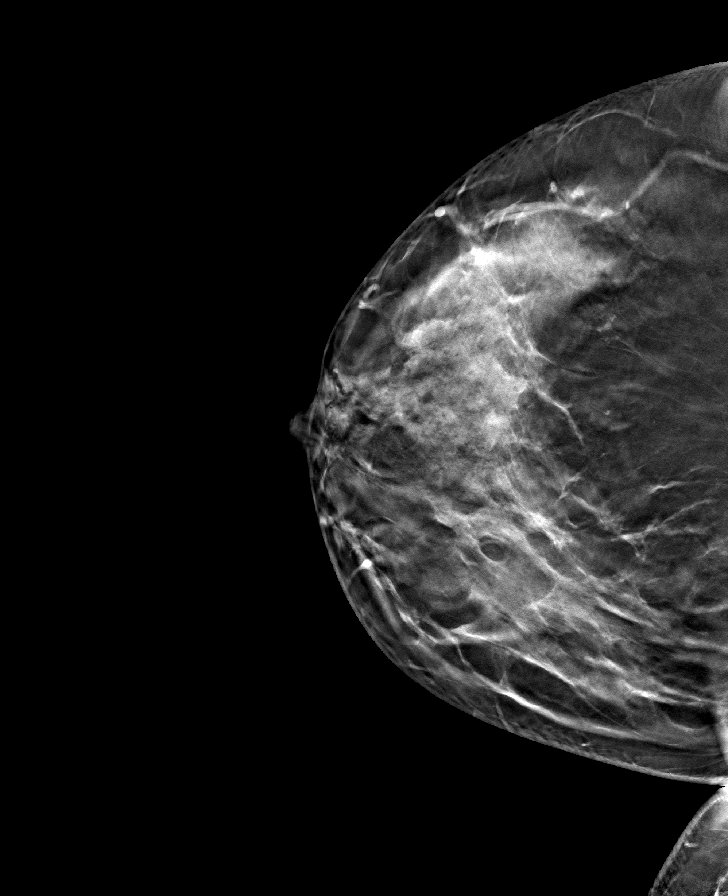

[L MLO tomo · tomo slice 45/88.0]
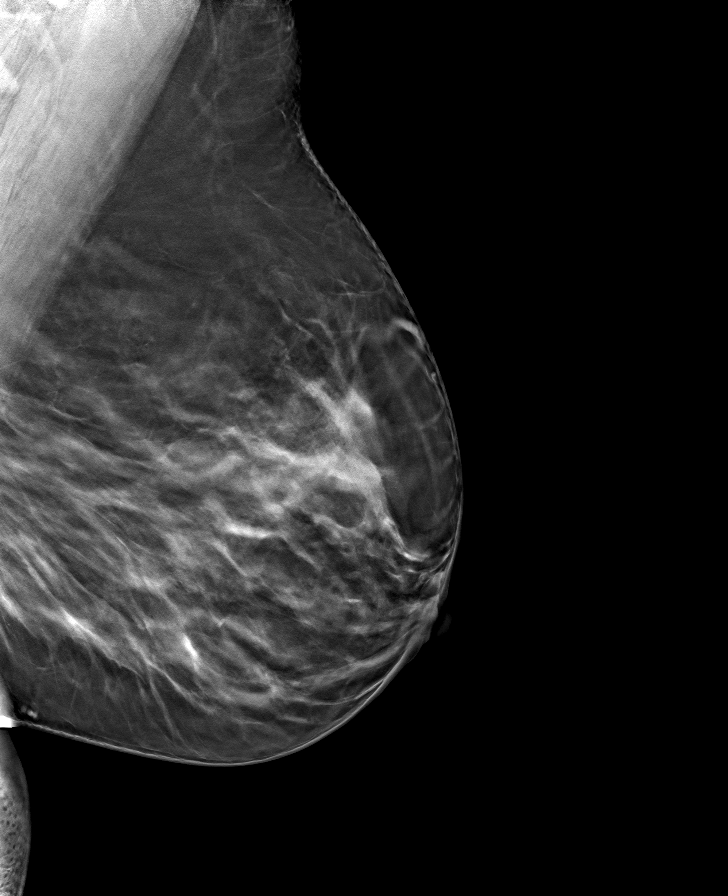

[8 of 24 positions shown; findings below may reference images not displayed]

ACR Breast Density Category c: The breast tissue is heterogeneously
dense, which may obscure small masses.
FINDINGS: A possible asymmetry in the lateral left breast resolves on
additional imaging. A possible asymmetry in the medial left breast
resolves on additional imaging. Possible distortion seen on the MLO
view resolves on additional imaging. There is a suspected mass in
the right retroareolar region. No other mammographic findings are
identified.

Mammographic images were processed with CAD.

On physical exam, no suspicious lumps are identified.

Targeted ultrasound is performed, showing multiple dilated ducts
containing mobile debris in the right retroareolar region accounting
for the mammographic findings.
IMPRESSION: No mammographic or sonographic evidence of malignancy.

RECOMMENDATION:
Annual screening mammography beginning at the age of 40.

I have discussed the findings and recommendations with the patient.
If applicable, a reminder letter will be sent to the patient
regarding the next appointment.

BI-RADS CATEGORY  2: Benign.

## 2022-07-20 ENCOUNTER — Other Ambulatory Visit: Payer: Self-pay

## 2022-07-20 DIAGNOSIS — E7800 Pure hypercholesterolemia, unspecified: Secondary | ICD-10-CM

## 2022-07-20 MED ORDER — ROSUVASTATIN CALCIUM 20 MG PO TABS: 20.0000 mg | ORAL_TABLET | Freq: Every day | ORAL | 1 refills | Status: DC

## 2022-10-22 ENCOUNTER — Encounter: Payer: Self-pay | Admitting: Internal Medicine

## 2022-10-22 ENCOUNTER — Ambulatory Visit: Payer: BC Managed Care – PPO | Admitting: Internal Medicine

## 2022-10-22 VITALS — BP 113/80 | HR 79 | Ht 62.0 in | Wt 197.0 lb

## 2022-10-22 DIAGNOSIS — E7800 Pure hypercholesterolemia, unspecified: Secondary | ICD-10-CM

## 2022-10-22 DIAGNOSIS — I10 Essential (primary) hypertension: Secondary | ICD-10-CM

## 2022-10-22 DIAGNOSIS — R931 Abnormal findings on diagnostic imaging of heart and coronary circulation: Secondary | ICD-10-CM

## 2022-10-22 NOTE — Progress Notes (Signed)
Primary Physician/Referring:  Stevphen Rochester, MD  Patient ID: Elizabeth Yang, female    DOB: [birth year], 42 y.o.   MRN: [id number]  Chief Complaint  Patient presents with   Essential hypertension   Follow-up   HPI:    Elizabeth Yang  is a 42 y.o. Caucasian female with mixed hyperlipdemia, mild obesity,  hypertension diagnosed at age 40 years, also has strong family history of premature CAD, mother had an MI at age 56, she has 3 maternal uncles who developed heart disease in their 61s and 38s, and her maternal grandfather died at age 94 of an MI.  Patient presents for her annual follow-up visit. She has been doing well since the last time she was here. No concerns or complaints today. She is tolerating her medications without any side effects and states she is complaint with her medication regiment. She is frustrated that she is unable to lose weight again. She had lost all of her baby weight after her third child but has since gained it all back. She is not eating excessively and does try to exercise regularly. She did have her thyroid checked and it was low so she was started on Synthroid. She feels like she needs some help losing weight but Reginal Lutes was not approved by her insurance. Denies dizziness, syncope, near syncope.  Denies chest pain, palpitations.   Past Medical History:  Diagnosis Date   Anxiety    Asthma    Blood per rectum 09/01/2020   Boils 09/01/2020   Constipation 09/01/2020   Dehydration 09/01/2020   Headache    Hematuria 09/01/2020   History of kidney stones    Hypertension    Hypertension affecting pregnancy, antepartum 09/13/2016   Kidney stones 09/01/2020   Loose stools 09/01/2020   Nausea and vomiting 09/01/2020   Vaginal Pap smear, abnormal    Vitamin D deficiency    Past Surgical History:  Procedure Laterality Date   CESAREAN SECTION N/A 09/27/2014   Procedure: CESAREAN SECTION;  Surgeon: Genia Del, MD;  Location: [facility];  Service: Obstetrics;   Laterality: N/A;   CESAREAN SECTION N/A 09/13/2016   Procedure: Repeat CESAREAN SECTION;  Surgeon: Shea Evans, MD;  Location: [facility] SU[facility];  Service: Obstetrics;  Laterality: N/A;  EDD: 09/26/16    CESAREAN SECTION N/A 04/16/2018   Procedure: Repeat CESAREAN SECTION;  Surgeon: Shea Evans, MD;  Location: [facility] BIRTHING SUIT[facility];  Service: Obstetrics;  Laterality: N/A;  EDD: 04/23/18   COLONOSCOPY     CYSTOSCOPY/URETEROSCOPY/HOLMIUM LASER/STENT PLACEMENT Right 09/13/2020   Procedure: CYSTOSCOPY/URETEROSCOPYLASER LITHOTRISY/STENT PLACEMENT;  Surgeon: Crist Fat, MD;  Location: [facility];  Service: Urology;  Laterality: Right;   KIDNEY STONE SURGERY     TONSILLECTOMY     WISDOM TOOTH EXTRACTION     Family History  Problem Relation Age of Onset   Heart disease Mother    Hypertension Mother    Heart attack Mother    Kidney disease Sister    Cancer Maternal Grandmother    Heart disease Maternal Grandfather    Diabetes Paternal Grandmother    Heart disease Maternal Aunt    Heart disease Maternal Uncle    Heart attack Maternal Uncle    Stomach cancer Neg Hx    Colon cancer Neg Hx    Esophageal cancer Neg Hx    Pancreatic cancer Neg Hx     Social History   Tobacco Use   Smoking status: Never   Smokeless tobacco: Never  Substance Use Topics  Alcohol use: Not Currently   Marital Status: Married  ROS  Review of Systems  Constitutional: Negative for malaise/fatigue and weight gain.  Cardiovascular:  Negative for chest pain, claudication, dyspnea on exertion, leg swelling, near-syncope, orthopnea, palpitations, paroxysmal nocturnal dyspnea and syncope.  Neurological:  Negative for dizziness.   Objective  Blood pressure 113/80, pulse 79, height 5\' 2"  (1.575 m), weight 197 lb (89.4 kg), SpO2 97 %.     10/22/2022    1:18 PM 11/16/2021    9:09 AM 02/10/2021   12:10 PM  Vitals with BMI  Height 5\' 2"  5\' 2"    Weight 197 lbs 169 lbs 10 oz   BMI 36.02 31.01   Systolic 113  116 130  Diastolic 80 80 95  Pulse 79 91 72      Physical Exam Vitals reviewed.  Constitutional:      General: She is not in acute distress.    Appearance: Normal appearance. She is well-developed and normal weight.  Cardiovascular:     Rate and Rhythm: Normal rate and regular rhythm.     Pulses: Intact distal pulses.     Heart sounds: Normal heart sounds, S1 normal and S2 normal. No murmur heard.    No gallop.     Comments: No leg edema, no JVD. Pulmonary:     Effort: Pulmonary effort is normal. No respiratory distress.     Breath sounds: Normal breath sounds. No wheezing, rhonchi or rales.  Musculoskeletal:     Right lower leg: No edema.     Left lower leg: No edema.  Skin:    General: Skin is warm and dry.  Neurological:     Mental Status: She is alert.   Physical exam unchanged compared to previous office visit.  Laboratory examination:   No results for input(s): "NA", "K", "CL", "CO2", "GLUCOSE", "BUN", "CREATININE", "CALCIUM", "GFRNONAA", "GFRAA" in the last 8760 hours.  CrCl cannot be calculated (Patient's most recent lab result is older than the maximum 21 days allowed.).     Latest Ref Rng & Units 07/15/2020    2:18 PM 07/31/2019    2:49 PM 04/20/2018    8:39 PM  CMP  Glucose 70 - 99 mg/dL 161  096  86   BUN 6 - 20 mg/dL 14  15  19    Creatinine 0.44 - 1.00 mg/dL 0.45  4.09  8.11   Sodium 135 - 145 mmol/L 137  137  137   Potassium 3.5 - 5.1 mmol/L 3.9  4.2  3.8   Chloride 98 - 111 mmol/L 108  105  107   CO2 22 - 32 mmol/L 20  23  21    Calcium 8.9 - 10.3 mg/dL 9.1  9.3  8.8   Total Protein 6.5 - 8.1 g/dL 7.1  7.7  6.6   Total Bilirubin 0.3 - 1.2 mg/dL 0.9  0.7  0.4   Alkaline Phos 38 - 126 U/L 49  63  106   AST 15 - 41 U/L 8  9  23    ALT 0 - 44 U/L 15  17  59       Latest Ref Rng & Units 07/15/2020    2:18 PM 07/31/2019    2:49 PM 04/20/2018    8:39 PM  CBC  WBC 4.0 - 10.5 K/uL 12.5  5.5  7.4   Hemoglobin 12.0 - 15.0 g/dL 91.4  78.2  95.6   Hematocrit  36.0 - 46.0 % 43.1  44.7  36.3  Platelets 150 - 400 K/uL 238  223  201     Lipid Panel     Component Value Date/Time   CHOL 188 11/18/2020 0907   TRIG 184 (H) 11/18/2020 0907   HDL 67 11/18/2020 0907   LDLCALC 90 11/18/2020 0907   LDLDIRECT 63 07/01/2019 0844   HEMOGLOBIN A1C No results found for: "HGBA1C", "MPG" TSH No results for input(s): "TSH" in the last 8760 hours. Ref Range & Units 05/18/2019   Vitamin D 1, 25 (OH)2 Total pg/mL 53    External labs:  08/25/2021: TSH 1.71 Total cholesterol 159, triglycerides 112, HDL 63, LDL 76 A1c 5.3% BUN 14, creatinine 0.89, GFR 84, sodium 138, potassium 4.0 Hgb 14.5, HCT 40.9, platelet 222  08/30/2020: BUN 12, creatinine 0.80, GFR 96, sodium 141, alk phos 50, AST 11, ALT 14 Hemoglobin 14.8, hematocrit 44.4, MCV 92, platelet 234 TSH 1.48  06/24/2020: Total cholesterol 171, triglycerides 149, HDL 57, LDL 88  12/31/2018: Serum glucose 100 g, BUN 17, creatinine 1.0, EGFR greater than 69, potassium 4.8.  Hb 15.3/HCT 44.8, platelets 216.  Normal indicis.   Total cholesterol 285, triglycerides 218, HDL 43, LDL 198.  Non-HDL cholesterol 242.  06/03/2015: Total cholesterol 288, triglycerides 245, HDL 62, LDL 177, serum glucose 107, creatinine 1.0, potassium 4.5, CMP otherwise normal.  Allergies  No Known Allergies    Medications Prior to Visit:   Outpatient Medications Prior to Visit  Medication Sig Dispense Refill   albuterol (PROVENTIL HFA;VENTOLIN HFA) 108 (90 BASE) MCG/ACT inhaler Inhale 2 puffs into the lungs every 6 (six) hours as needed for wheezing or shortness of breath.     amLODipine (NORVASC) 10 MG tablet Take 1 tablet (10 mg total) by mouth daily. 90 tablet 3   busPIRone (BUSPAR) 10 MG tablet 10 mg daily as needed.     butalbital-acetaminophen-caffeine (FIORICET, ESGIC) 50-325-40 MG tablet Take 1 tablet by mouth 2 (two) times daily as needed for migraine.     cyclobenzaprine (FLEXERIL) 10 MG tablet Take by mouth.      levothyroxine (SYNTHROID) 50 MCG tablet Take 1 tablet by mouth daily.     Nebivolol HCl 20 MG TABS Take 1 tablet (20 mg total) by mouth daily. 90 tablet 1   rosuvastatin (CRESTOR) 20 MG tablet Take 1 tablet (20 mg total) by mouth daily. 90 tablet 1   sertraline (ZOLOFT) 100 MG tablet Take 2 tablets by mouth daily.     aspirin EC 81 MG tablet Take 1 tablet (81 mg total) by mouth daily. Swallow whole. 90 tablet 3   gabapentin (NEURONTIN) 300 MG capsule Take by mouth.     Potassium Citrate 15 MEQ (1620 MG) TBCR Take by mouth.     WEGOVY 2.4 MG/0.75ML SOAJ Inject 1 mg into the skin once a week.     No facility-administered medications prior to visit.   Final Medications at End of Visit    Current Meds  Medication Sig   albuterol (PROVENTIL HFA;VENTOLIN HFA) 108 (90 BASE) MCG/ACT inhaler Inhale 2 puffs into the lungs every 6 (six) hours as needed for wheezing or shortness of breath.   amLODipine (NORVASC) 10 MG tablet Take 1 tablet (10 mg total) by mouth daily.   busPIRone (BUSPAR) 10 MG tablet 10 mg daily as needed.   butalbital-acetaminophen-caffeine (FIORICET, ESGIC) 50-325-40 MG tablet Take 1 tablet by mouth 2 (two) times daily as needed for migraine.   cyclobenzaprine (FLEXERIL) 10 MG tablet Take by mouth.   levothyroxine (SYNTHROID) 50 MCG  tablet Take 1 tablet by mouth daily.   Nebivolol HCl 20 MG TABS Take 1 tablet (20 mg total) by mouth daily.   rosuvastatin (CRESTOR) 20 MG tablet Take 1 tablet (20 mg total) by mouth daily.   sertraline (ZOLOFT) 100 MG tablet Take 2 tablets by mouth daily.    Radiology:   No results found.  Cardiac Studies:  Exercise Sestamibi stress test 11/28/2020: Exercise nuclear stress test was performed using Bruce protocol. Patient reached 13.4 METS, and 88% of age predicted maximum heart rate. Exercise capacity was excellent. No chest pain reported. Heart rate and hemodynamic response were normal. Stress EKG revealed no ischemic changes. Normal myocardial  perfusion. Stress LVEF 56%. Low risk study.  Echocardiogram 11/16/2020:  Left ventricle cavity is normal in size and wall thickness. Normal global  wall motion. Normal LV systolic function with EF 61%. Normal diastolic  filling pattern.  Mild (Grade I) mitral regurgitation.  No evidence of pulmonary hypertension.  Coronary calcium score 10/06/2020: Left Main: 0 LAD: 25.5 LCx: 0 RCA: 68 Total Agatston Score: 93.5 MESA database percentile: [place] only applies to patient's age 64 years and older. This patient shows age advanced coronary artery calcium. For instance, if this patient were 41 years old she would be in the ninety-ninth percentile for age, race and gender matched comparison.  Renal artery duplex  08/02/2020:  No evidence of renal artery occlusive disease in either renal artery.  Normal intrarenal vascular perfusion is noted in both kidneys.  Renal length is within normal limits for both kidneys.  Normal abdominal aorta flow velocities noted. No significant plaque burden  noted in the abdominal aorta.   EKG:  EKG 11/16/2021: Sinus rhythm and rate of 79 bpm.  Normal axis.  No evidence of ischemia or underlying injury pattern.  Compared EKG 06/30/2020, no significant change.  10/22/2022: normal sinus rhythm, rate 68 bpm. Normal axis, normal R wave progression. No evidence of ischemia. Unchanged from prior.  Assessment     ICD-10-CM   1. Hypercholesteremia  E78.00 EKG 12-Lead    2. Essential hypertension  I10     3. Elevated coronary artery calcium score  R93.1       No orders of the defined types were placed in this encounter.   Medications Discontinued During This Encounter  Medication Reason   aspirin EC 81 MG tablet    gabapentin (NEURONTIN) 300 MG capsule    Potassium Citrate 15 MEQ (1620 MG) TBCR    WEGOVY 2.4 MG/0.75ML SOAJ     Recommendations:   LATAJA CHEATWOOD  is a  42 y.o. Caucasian female with mixed hyperlipdemia, mild obesity,  hypertension  diagnosed at age 69 years, also has strong family history of premature CAD, mother had an MI at age 34, she has 3 maternal uncles who developed heart disease in their 43s and 51s, and her maternal grandfather died at age 38 of an MI. Therapy for hyperlipidemia has been held due to patient lactating /child bearing.  Her 1st pregnancy in 2014 had bad outcomes due to uncontrolled hypertension, PET, she lost her 1st child. Total coronary calcium score of 93.5 which is advanced for patient's age.  Hypercholesteremia Continue Crestor 30 mg daily  Essential hypertension Continue current cardiac medications. Encourage low-sodium diet, less than 2000 mg daily. Blood pressure is very well controlled today  Elevated coronary artery calcium score Continue Crestor Nuclear stress test and echo previously negative for ischemia   Follow-up in 1 year, sooner if needed.  Clotilde Dieter, DO 10/22/2022, 1:30 PM Office: [phone]

## 2022-11-15 ENCOUNTER — Ambulatory Visit: Payer: BC Managed Care – PPO | Admitting: Internal Medicine

## 2022-11-15 ENCOUNTER — Ambulatory Visit: Payer: BC Managed Care – PPO | Admitting: Student

## 2022-12-18 ENCOUNTER — Other Ambulatory Visit (HOSPITAL_BASED_OUTPATIENT_CLINIC_OR_DEPARTMENT_OTHER): Payer: Self-pay

## 2023-05-06 ENCOUNTER — Encounter: Payer: Self-pay | Admitting: Cardiology

## 2023-05-06 ENCOUNTER — Ambulatory Visit: Payer: BC Managed Care – PPO | Attending: Cardiology | Admitting: Cardiology

## 2023-05-06 VITALS — BP 120/84 | HR 79 | Ht 62.0 in | Wt 208.2 lb

## 2023-05-06 DIAGNOSIS — E782 Mixed hyperlipidemia: Secondary | ICD-10-CM | POA: Diagnosis not present

## 2023-05-06 DIAGNOSIS — I2510 Atherosclerotic heart disease of native coronary artery without angina pectoris: Secondary | ICD-10-CM

## 2023-05-06 DIAGNOSIS — Z3A08 8 weeks gestation of pregnancy: Secondary | ICD-10-CM | POA: Diagnosis not present

## 2023-05-06 DIAGNOSIS — O10919 Unspecified pre-existing hypertension complicating pregnancy, unspecified trimester: Secondary | ICD-10-CM

## 2023-05-06 DIAGNOSIS — I2584 Coronary atherosclerosis due to calcified coronary lesion: Secondary | ICD-10-CM

## 2023-05-06 DIAGNOSIS — I10 Essential (primary) hypertension: Secondary | ICD-10-CM

## 2023-05-06 NOTE — Progress Notes (Signed)
Cardio-Obstetrics Clinic  New Evaluation  Date:  05/06/2023   ID:  Elizabeth Yang, DOB Sep 21, 1980, MRN [id number]  PCP:  Stevphen Rochester, MD   [facility] Providers Cardiologist:  None  Electrophysiologist:  None       Referring MD: Shea Evans, MD   Chief Complaint:   History of Present Illness:    Elizabeth Yang is a 42 y.o. female [G3P2103] who is being seen today for the evaluation of cardiovascular care in pregnancy at the request of Shea Evans, MD.   Medical history includes coronary artery calcification, hyperlipidemia, hypertension which was diagnosed at the age of 8, moderate obesity.  She was seen at [facility] cardiology and during this time she had a nuclear stress test, an echocardiogram as well as a duplex ultrasound.  She is currently pregnant.  She is even a tomorrow weeks pregnant.  She recently found out she was pregnant after she went to get a MRI for kidney stone.   She has had three previous pregnancies, all of which ended in C-sections. Her first pregnancy was complicated by severe preeclampsia, leading to premature birth at seven months gestation, and the infant unfortunately passed away six days postpartum. Her second pregnancy was also complicated by fluid retention and hypertension, leading to delivery two weeks prior to her due date. Her third pregnancy was the most successful, with delivery at [redacted] weeks gestation. She is currently estimated to be at 8-[redacted] weeks gestation in her fourth pregnancy. The patient also reports a history of kidney stones, with recent imaging revealing multiple stones on both sides.    Prior CV Studies Reviewed: The following studies were reviewed today: Coronary CTA, echocardiogram, previous stress test and renal ultrasound duplex  Past Medical History:  Diagnosis Date   Anxiety    Asthma    Blood per rectum 09/01/2020   Boils 09/01/2020   Constipation 09/01/2020   Dehydration 09/01/2020   Headache     Hematuria 09/01/2020   History of kidney stones    Hypertension    Hypertension affecting pregnancy, antepartum 09/13/2016   Kidney stones 09/01/2020   Loose stools 09/01/2020   Nausea and vomiting 09/01/2020   Vaginal Pap smear, abnormal    Vitamin D deficiency     Past Surgical History:  Procedure Laterality Date   CESAREAN SECTION N/A 09/27/2014   Procedure: CESAREAN SECTION;  Surgeon: Genia Del, MD;  Location: [facility];  Service: Obstetrics;  Laterality: N/A;   CESAREAN SECTION N/A 09/13/2016   Procedure: Repeat CESAREAN SECTION;  Surgeon: Shea Evans, MD;  Location: [facility]IRTHING SUITES;  Service: Obstetrics;  Laterality: N/A;  EDD: 09/26/16    CESAREAN SECTION N/A 04/16/2018   Procedure: Repeat CESAREAN SECTION;  Surgeon: Shea Evans, MD;  Location: [facility] BIRTHING SUITES;  Service: Obstetrics;  Laterality: N/A;  EDD: 04/23/18   COLONOSCOPY     CYSTOSCOPY/URETEROSCOPY/HOLMIUM LASER/STENT PLACEMENT Right 09/13/2020   Procedure: CYSTOSCOPY/URETEROSCOPYLASER LITHOTRISY/STENT PLACEMENT;  Surgeon: Crist Fat, MD;  Location: [facility];  Service: Urology;  Laterality: Right;   KIDNEY STONE SURGERY     TONSILLECTOMY     WISDOM TOOTH EXTRACTION        OB History     Gravida  3   Para  3   Term  2   Preterm  1   AB  0   Living  3      SAB  0   IAB  0   Ectopic  0   Multiple  0  Live Births  3               Current Medications: Current Meds  Medication Sig   albuterol (PROVENTIL HFA;VENTOLIN HFA) 108 (90 BASE) MCG/ACT inhaler Inhale 2 puffs into the lungs every 6 (six) hours as needed for wheezing or shortness of breath.   busPIRone (BUSPAR) 10 MG tablet 10 mg daily as needed.   labetalol (NORMODYNE) 100 MG tablet Take 100 mg by mouth 2 (two) times daily.   levothyroxine (SYNTHROID) 50 MCG tablet Take 1 tablet by mouth daily.   sertraline (ZOLOFT) 100 MG tablet Take 2 tablets by mouth daily.     Allergies:   Patient has no known allergies.    Social History   Socioeconomic History   Marital status: Married    Spouse name: Not on file   Number of children: 3   Years of education: Not on file   Highest education level: Not on file  Occupational History   Not on file  Tobacco Use   Smoking status: Never   Smokeless tobacco: Never  Vaping Use   Vaping status: Never Used  Substance and Sexual Activity   Alcohol use: Not Currently   Drug use: No   Sexual activity: Yes    Birth control/protection: Pill  Other Topics Concern   Not on file  Social History Narrative   Not on file   Social Determinants of Health   Financial Resource Strain: Low Risk  (07/06/2022)   Received from [facility], [facility]   Overall Financial Resource Strain (CARDIA)    Difficulty of Paying Living Expenses: Not very hard  Food Insecurity: No Food Insecurity (07/06/2022)   Received from [facility], [facility]   Hunger Vital Sign    Worried About Running Out of Food in the Last Year: Never true    Ran Out of Food in the Last Year: Never true  Transportation Needs: Unmet Transportation Needs (07/06/2022)   Received from [facility], [facility]   PRAPARE - Transportation    Lack of Transportation (Medical): Yes    Lack of Transportation (Non-Medical): No  Physical Activity: Sufficiently Active (07/06/2022)   Received from [facility], [facility]   Exercise Vital Sign    Days of Exercise per Week: 6 days    Minutes of Exercise per Session: 120 min  Stress: Stress Concern Present (07/06/2022)   Received from [facility] - Occupational Stress Questionnaire    Feeling of Stress : To some extent  Social Connections: Moderately Integrated (07/06/2022)   Received from [facility], [facility]   Social Network    How would you rate your social network (family, work, friends)?: Adequate participation with social networks      Family History  Problem Relation Age of  Onset   Heart disease Mother    Hypertension Mother    Heart attack Mother    Kidney disease Sister    Cancer Maternal Grandmother    Heart disease Maternal Grandfather    Diabetes Paternal Grandmother    Heart disease Maternal Aunt    Heart disease Maternal Uncle    Heart attack Maternal Uncle    Stomach cancer Neg Hx    Colon cancer Neg Hx    Esophageal cancer Neg Hx    Pancreatic cancer Neg Hx       ROS:   Please see the history of present illness.     All other  systems reviewed and are negative.   Labs/EKG Reviewed:    EKG:   EKG was not ordered today.  ]  Recent Labs: No results found for requested labs within last 365 days.   Recent Lipid Panel Lab Results  Component Value Date/Time   CHOL 188 11/18/2020 09:07 AM   TRIG 184 (H) 11/18/2020 09:07 AM   HDL 67 11/18/2020 09:07 AM   LDLCALC 90 11/18/2020 09:07 AM   LDLDIRECT 63 07/01/2019 08:44 AM    Physical Exam:    VS:  BP 120/84 (BP Location: Left Arm, Patient Position: Sitting, Cuff Size: Normal)   Pulse 79   Ht 5\' 2"  (1.575 m)   Wt 208 lb 3.2 oz (94.4 kg)   SpO2 97%   BMI 38.08 kg/m     Wt Readings from Last 3 Encounters:  05/06/23 208 lb 3.2 oz (94.4 kg)  10/22/22 197 lb (89.4 kg)  11/16/21 169 lb 9.6 oz (76.9 kg)     GEN:  Well nourished, well developed in no acute distress HEENT: Normal NECK: No JVD; No carotid bruits LYMPHATICS: No lymphadenopathy CARDIAC: RRR, no murmurs, rubs, gallops RESPIRATORY:  Clear to auscultation without rales, wheezing or rhonchi  ABDOMEN: Soft, non-tender, non-distended MUSCULOSKELETAL:  No edema; No deformity  SKIN: Warm and dry NEUROLOGIC:  Alert and oriented x 3 PSYCHIATRIC:  Normal affect    Risk Assessment/Risk Calculators:     CARPREG II Risk Prediction Index Score:  3.  The patient's risk for a primary cardiac event is 15%.            ASSESSMENT & PLAN:    Chronic hypertension pregnancy-blood pressure is at target today.  No changes in her  medication.  Will continue amlodipine 10 mg daily with her labetalol 100 mg twice a day.  Coronary artery calcification/hyperlipidemia - will stop crestor today.   High Risk Pregnancy -  History of preeclampsia and premature birth in previous pregnancies. Currently 8-[redacted] weeks pregnant. No current signs of preeclampsia. -Plan to start low dose aspirin at 12 weeks to  -Monitor blood pressure closely starting at 12 weeks. -Follow up in 4 weeks.   Ankle Swelling Unilateral ankle swelling, cause unknown. -Monitor during pregnancy.  General Health Maintenance  Continue healthy diet and lifestyle modifications. Perform ultrasound to confirm gestational age.  Will follow-up in 4 weeks.   Patient Instructions  Medication Instructions:  Your physician recommends that you continue on your current medications as directed. Please refer to the Current Medication list given to you today.  *If you need a refill on your cardiac medications before your next appointment, please call your pharmacy*   Follow-Up: At [facility], you and your health needs are our priority.  As part of our continuing mission to provide you with exceptional heart care, we have created designated Provider Care Teams.  These Care Teams include your primary Cardiologist (physician) and Advanced Practice Providers (APPs -  Physician Assistants and Nurse Practitioners) who all work together to provide you with the care you need, when you need it.   Your next appointment:   4 week(s)  Provider:   Thomasene Ripple, DO     Dispo:  No follow-ups on file.   Medication Adjustments/Labs and Tests Ordered: Current medicines are reviewed at length with the patient today.  Concerns regarding medicines are outlined above.  Tests Ordered: No orders of the defined types were placed in this encounter.  Medication Changes: No orders of the defined types were placed in  this encounter.

## 2023-05-06 NOTE — Patient Instructions (Signed)
 Medication Instructions:  Your physician recommends that you continue on your current medications as directed. Please refer to the Current Medication list given to you today.  *If you need a refill on your cardiac medications before your next appointment, please call your pharmacy*    Follow-Up: At [facility], you and your health needs are our priority.  As part of our continuing mission to provide you with exceptional heart care, we have created designated Provider Care Teams.  These Care Teams include your primary Cardiologist (physician) and Advanced Practice Providers (APPs -  Physician Assistants and Nurse Practitioners) who all work together to provide you with the care you need, when you need it.   Your next appointment:   4 week(s)  Provider:   Thomasene Ripple, DO

## 2023-05-30 LAB — OB RESULTS CONSOLE RPR: RPR: NONREACTIVE

## 2023-05-30 LAB — OB RESULTS CONSOLE RUBELLA ANTIBODY, IGM: Rubella: IMMUNE

## 2023-05-30 LAB — OB RESULTS CONSOLE GC/CHLAMYDIA
Chlamydia: NEGATIVE
Neisseria Gonorrhea: NEGATIVE

## 2023-05-30 LAB — OB RESULTS CONSOLE HEPATITIS B SURFACE ANTIGEN: Hepatitis B Surface Ag: NEGATIVE

## 2023-05-30 LAB — OB RESULTS CONSOLE HIV ANTIBODY (ROUTINE TESTING): HIV: NONREACTIVE

## 2023-06-13 ENCOUNTER — Ambulatory Visit: Payer: 59 | Attending: Cardiology | Admitting: Cardiology

## 2023-06-13 ENCOUNTER — Encounter: Payer: Self-pay | Admitting: Cardiology

## 2023-06-13 VITALS — BP 132/84 | HR 74 | Ht 62.0 in | Wt 204.8 lb

## 2023-06-13 DIAGNOSIS — Z3A13 13 weeks gestation of pregnancy: Secondary | ICD-10-CM

## 2023-06-13 DIAGNOSIS — O10919 Unspecified pre-existing hypertension complicating pregnancy, unspecified trimester: Secondary | ICD-10-CM

## 2023-06-13 DIAGNOSIS — I10 Essential (primary) hypertension: Secondary | ICD-10-CM

## 2023-06-13 DIAGNOSIS — I2510 Atherosclerotic heart disease of native coronary artery without angina pectoris: Secondary | ICD-10-CM

## 2023-06-13 DIAGNOSIS — I2584 Coronary atherosclerosis due to calcified coronary lesion: Secondary | ICD-10-CM

## 2023-06-13 MED ORDER — ASPIRIN 81 MG PO TBEC: 81.0000 mg | DELAYED_RELEASE_TABLET | Freq: Every day | ORAL | Status: DC

## 2023-06-13 NOTE — Progress Notes (Signed)
 Cardio-Obstetrics Clinic  Follow Up Note   Date:  06/13/2023   ID:  Elizabeth Yang, DOB 1981-05-20, MRN [id number]  PCP:  Gladystine Erminio LITTIE, MD   [facility] Providers Cardiologist:  Dub Huntsman, DO  Electrophysiologist:  None        Referring MD: Gladystine Erminio LITTIE, MD   Chief Complaint:  I am doing ok:  History of Present Illness:    Elizabeth Yang is a 43 y.o. female [G3P2103] who returns for follow up cardiovascular care in pregnancy.   Medical history includes coronary artery calcification, hyperlipidemia, hypertension which was diagnosed at the age of 81, obesity, history of premature birth.  She is here for follow-up visit she is currently [redacted] weeks pregnant.  She is doing well.  She will follow complaints today.   Prior CV Studies Reviewed: The following studies were reviewed today: Echo, nuclear stress test and duplex ultrasound  Past Medical History:  Diagnosis Date   Anxiety    Asthma    Blood per rectum 09/01/2020   Boils 09/01/2020   Constipation 09/01/2020   Dehydration 09/01/2020   Headache    Hematuria 09/01/2020   History of kidney stones    Hypertension    Hypertension affecting pregnancy, antepartum 09/13/2016   Kidney stones 09/01/2020   Loose stools 09/01/2020   Nausea and vomiting 09/01/2020   Vaginal Pap smear, abnormal    Vitamin D  deficiency     Past Surgical History:  Procedure Laterality Date   CESAREAN SECTION N/A 09/27/2014   Procedure: CESAREAN SECTION;  Surgeon: Marie-Lyne Lavoie, MD;  Location: [facility];  Service: Obstetrics;  Laterality: N/A;   CESAREAN SECTION N/A 09/13/2016   Procedure: Repeat CESAREAN SECTION;  Surgeon: Robbi Render, MD;  Location: [facility] BIRTHING SU[facility];  Service: Obstetrics;  Laterality: N/A;  EDD: 09/26/16    CESAREAN SECTION N/A 04/16/2018   Procedure: Repeat CESAREAN SECTION;  Surgeon: Render Robbi, MD;  Location: [facility] BIRTHING SUIT[facility];  Service: Obstetrics;  Laterality: N/A;  EDD: 04/23/18    COLONOSCOPY     CYSTOSCOPY/URETEROSCOPY/HOLMIUM LASER/STENT PLACEMENT Right 09/13/2020   Procedure: CYSTOSCOPY/URETEROSCOPYLASER LITHOTRISY/STENT PLACEMENT;  Surgeon: Cam Morene ORN, MD;  Location: [facility];  Service: Urology;  Laterality: Right;   KIDNEY STONE SURGERY     TONSILLECTOMY     WISDOM TOOTH EXTRACTION        OB History     Gravida  3   Para  3   Term  2   Preterm  1   AB  0   Living  3      SAB  0   IAB  0   Ectopic  0   Multiple  0   Live Births  3               Current Medications: Current Meds  Medication Sig   albuterol  (PROVENTIL  HFA;VENTOLIN  HFA) 108 (90 BASE) MCG/ACT inhaler Inhale 2 puffs into the lungs every 6 (six) hours as needed for wheezing or shortness of breath.   aspirin  EC 81 MG tablet Take 1 tablet (81 mg total) by mouth daily. Swallow whole.   busPIRone (BUSPAR) 10 MG tablet 10 mg daily as needed.   butalbital -acetaminophen -caffeine  (FIORICET , ESGIC ) 50-325-40 MG tablet Take 1 tablet by mouth 2 (two) times daily as needed for migraine.   cyclobenzaprine  (FLEXERIL ) 10 MG tablet Take by mouth.   labetalol  (NORMODYNE ) 100 MG tablet Take 100 mg by mouth 2 (two) times daily.   levothyroxine  (SYNTHROID ) 50 MCG  tablet Take 1 tablet by mouth daily.   sertraline  (ZOLOFT ) 100 MG tablet Take 2 tablets by mouth daily.     Allergies:   Patient has no known allergies.   Social History   Socioeconomic History   Marital status: Married    Spouse name: Not on file   Number of children: 3   Years of education: Not on file   Highest education level: Not on file  Occupational History   Not on file  Tobacco Use   Smoking status: Never   Smokeless tobacco: Never  Vaping Use   Vaping status: Never Used  Substance and Sexual Activity   Alcohol use: Not Currently   Drug use: No   Sexual activity: Yes    Birth control/protection: Pill  Other Topics Concern   Not on file  Social History Narrative   Not on file   Social Drivers of  Health   Financial Resource Strain: Low Risk  (07/06/2022)   Received from [facility], [facility]   Overall Financial Resource Strain (CARDIA)    Difficulty of Paying Living Expenses: Not very hard  Food Insecurity: No Food Insecurity (07/06/2022)   Received from [facility], [facility]   Hunger Vital Sign    Worried About Running Out of Food in the Last Year: Never true    Ran Out of Food in the Last Year: Never true  Transportation Needs: Unmet Transportation Needs (07/06/2022)   Received from [facility], [facility]   PRAPARE - Transportation    Lack of Transportation (Medical): Yes    Lack of Transportation (Non-Medical): No  Physical Activity: Sufficiently Active (07/06/2022)   Received from [facility], [facility]   Exercise Vital Sign    Days of Exercise per Week: 6 days    Minutes of Exercise per Session: 120 min  Stress: Stress Concern Present (07/06/2022)   Received from [facility], [facility] - Occupational Stress Questionnaire    Feeling of Stress : To some extent  Social Connections: Moderately Integrated (07/06/2022)   Received from [facility], [facility]   Social Network    How would you rate your social network (family, work, friends)?: Adequate participation with social networks      Family History  Problem Relation Age of Onset   Heart disease Mother    Hypertension Mother    Heart attack Mother    Kidney disease Sister    Cancer Maternal Grandmother    Heart disease Maternal Grandfather    Diabetes Paternal Grandmother    Heart disease Maternal Aunt    Heart disease Maternal Uncle    Heart attack Maternal Uncle    Stomach cancer Neg Hx    Colon cancer Neg Hx    Esophageal cancer Neg Hx    Pancreatic cancer Neg Hx       ROS:   Please see the history of present illness.     All other systems reviewed and are negative.   Labs/EKG Reviewed:    EKG:   EKG shows sinus rhythm, heart  rate 74 bpm Recent Labs: No results found for requested labs within last 365 days.   Recent Lipid Panel Lab Results  Component Value Date/Time   CHOL 188 11/18/2020 09:07 AM   TRIG 184 (H) 11/18/2020 09:07 AM   HDL 67 11/18/2020 09:07 AM   LDLCALC 90 11/18/2020 09:07 AM   LDLDIRECT 63 07/01/2019 08:44 AM    Physical Exam:  VS:  BP 132/84 (BP Location: Right Arm, Patient Position: Sitting)   Pulse 74   Ht 5' 2 (1.575 m)   Wt 204 lb 12.8 oz (92.9 kg)   SpO2 98%   BMI 37.46 kg/m     Wt Readings from Last 3 Encounters:  06/13/23 204 lb 12.8 oz (92.9 kg)  05/06/23 208 lb 3.2 oz (94.4 kg)  10/22/22 197 lb (89.4 kg)     GEN: Well nourished, well developed in no acute distress HEENT: Normal NECK: No JVD; No carotid bruits LYMPHATICS: No lymphadenopathy CARDIAC: RRR, no murmurs, rubs, gallops RESPIRATORY:  Clear to auscultation without rales, wheezing or rhonchi  ABDOMEN: Soft, non-tender, non-distended MUSCULOSKELETAL:  No edema; No deformity  SKIN: Warm and dry NEUROLOGIC:  Alert and oriented x 3 PSYCHIATRIC:  Normal affect    Risk Assessment/Risk Calculators:     CARPREG II Risk Prediction Index Score:  1.  The patient's risk for a primary cardiac event is 5%.   Modified World Health Organization [facility]) Classification of Maternal CV Risk   Class I         ASSESSMENT & PLAN:    Chronic hypertension pregnancy, blood pressure is at target today we will continue her current medication regimen which includes amlodipine  10 mg daily and labetalol  10 mg twice a day.  Coronary artery calcification/hyperlipidemia-holding statin during pregnancy.  High risk pregnancy-history of preeclampsia and premature birth in previous pregnancy.  Will start aspirin  81 mg daily for pregnancy.  Prophylaxis at this time.  Will continue monitoring.  Will follow-up with the patient in 4 weeks.    Patient Instructions  Medication Instructions:  Your physician has recommended you  make the following change in your medication:  START: Aspirin  81 mg once daily *If you need a refill on your cardiac medications before your next appointment, please call your pharmacy*   Follow-Up: At [facility], you and your health needs are our priority.  As part of our continuing mission to provide you with exceptional heart care, we have created designated Provider Care Teams.  These Care Teams include your primary Cardiologist (physician) and Advanced Practice Providers (APPs -  Physician Assistants and Nurse Practitioners) who all work together to provide you with the care you need, when you need it.  Your next appointment:   10 week(s)  Provider:   Keyauna Graefe, DO    Other Instructions Please take your blood pressure daily for 2 weeks and send in a MyChart message. Please include heart rates. (One message at the end of the 2 weeks).   HOW TO TAKE YOUR BLOOD PRESSURE: Rest 5 minutes before taking your blood pressure. Don't smoke or drink caffeinated beverages for at least 30 minutes before. Take your blood pressure before (not after) you eat. Sit comfortably with your back supported and both feet on the floor (don't cross your legs). Elevate your arm to heart level on a table or a desk. Use the proper sized cuff. It should fit smoothly and snugly around your bare upper arm. There should be enough room to slip a fingertip under the cuff. The bottom edge of the cuff should be 1 inch above the crease of the elbow. Ideally, take 3 measurements at one sitting and record the average.           Dispo:  No follow-ups on file.   Medication Adjustments/Labs and Tests Ordered: Current medicines are reviewed at length with the patient today.  Concerns regarding medicines are outlined above.  Tests Ordered: Orders Placed This Encounter  Procedures   EKG 12-Lead   Medication Changes: Meds ordered this encounter  Medications   aspirin  EC 81 MG tablet    Sig: Take 1  tablet (81 mg total) by mouth daily. Swallow whole.

## 2023-06-13 NOTE — Patient Instructions (Addendum)
 Medication Instructions:  Your physician has recommended you make the following change in your medication:  START: Aspirin  81 mg once daily *If you need a refill on your cardiac medications before your next appointment, please call your pharmacy*   Follow-Up: At [facility], you and your health needs are our priority.  As part of our continuing mission to provide you with exceptional heart care, we have created designated Provider Care Teams.  These Care Teams include your primary Cardiologist (physician) and Advanced Practice Providers (APPs -  Physician Assistants and Nurse Practitioners) who all work together to provide you with the care you need, when you need it.  Your next appointment:   10 week(s)  Provider:   Kardie Tobb, DO    Other Instructions Please take your blood pressure daily for 2 weeks and send in a MyChart message. Please include heart rates. (One message at the end of the 2 weeks).   HOW TO TAKE YOUR BLOOD PRESSURE: Rest 5 minutes before taking your blood pressure. Don't smoke or drink caffeinated beverages for at least 30 minutes before. Take your blood pressure before (not after) you eat. Sit comfortably with your back supported and both feet on the floor (don't cross your legs). Elevate your arm to heart level on a table or a desk. Use the proper sized cuff. It should fit smoothly and snugly around your bare upper arm. There should be enough room to slip a fingertip under the cuff. The bottom edge of the cuff should be 1 inch above the crease of the elbow. Ideally, take 3 measurements at one sitting and record the average.

## 2023-08-22 ENCOUNTER — Encounter: Payer: Self-pay | Admitting: Cardiology

## 2023-08-22 ENCOUNTER — Ambulatory Visit: Payer: 59 | Attending: Cardiology | Admitting: Cardiology

## 2023-08-22 VITALS — BP 122/70 | HR 82 | Ht 62.0 in | Wt 214.8 lb

## 2023-08-22 DIAGNOSIS — O10919 Unspecified pre-existing hypertension complicating pregnancy, unspecified trimester: Secondary | ICD-10-CM | POA: Diagnosis not present

## 2023-08-22 DIAGNOSIS — O09299 Supervision of pregnancy with other poor reproductive or obstetric history, unspecified trimester: Secondary | ICD-10-CM | POA: Diagnosis not present

## 2023-08-22 DIAGNOSIS — R600 Localized edema: Secondary | ICD-10-CM

## 2023-08-22 DIAGNOSIS — I2510 Atherosclerotic heart disease of native coronary artery without angina pectoris: Secondary | ICD-10-CM

## 2023-08-22 DIAGNOSIS — I2584 Coronary atherosclerosis due to calcified coronary lesion: Secondary | ICD-10-CM

## 2023-08-22 DIAGNOSIS — Z3A28 28 weeks gestation of pregnancy: Secondary | ICD-10-CM

## 2023-08-22 NOTE — Patient Instructions (Signed)
 Medication Instructions:  Your physician recommends that you continue on your current medications as directed. Please refer to the Current Medication list given to you today.  *If you need a refill on your cardiac medications before your next appointment, please call your pharmacy*   Follow-Up: At [facility], you and your health needs are our priority.  As part of our continuing mission to provide you with exceptional heart care, we have created designated Provider Care Teams.  These Care Teams include your primary Cardiologist (physician) and Advanced Practice Providers (APPs -  Physician Assistants and Nurse Practitioners) who all work together to provide you with the care you need, when you need it.   Your next appointment:   12 week(s)  Provider:   Thomasene Ripple, DO     Other Instructions PLEASE PURCHASE AND WEAR COMPRESSION STOCKINGS DAILY AND TAKE OFF AT BEDTIME.  I recommend getting support socks/stockings. 20-30 mmHg is the preferred amount of compression.    Compression stockings are elastic socks that squeeze the legs. They help to increase blood flow to the legs and to decrease swelling in the legs from fluid retention, and reduce the chance of developing blood clots in the lower legs. Please put on in the AM when dressing and off at night when dressing for bed.   [employer]  [address] ([phone]); [place], [place] [address]; [phone]  EMAIL:   [email or web address].   PLEASE MAKE SURE TO ELEVATE YOUR FEET & LEGS ABOVE YOUR HEART WHILE SITTING, THIS WILL HELP WITH THE SWELLING ALSO.

## 2023-08-23 NOTE — Progress Notes (Signed)
 Cardio-Obstetrics Clinic  Follow Up Note   Date:  08/23/2023   ID:  Elizabeth Yang, DOB April 01, 1981, MRN [id number]  PCP:  Stevphen Rochester, MD   [facility] Providers Cardiologist:  Thomasene Ripple, DO  Electrophysiologist:  None        Referring MD: Stevphen Rochester, MD   Chief Complaint: " I am doing ok":  History of Present Illness:    Elizabeth Yang is a 43 y.o. female [G4P2103] who returns for follow up cardiovascular care in pregnancy.   Medical history includes coronary artery calcification, hyperlipidemia, hypertension which was diagnosed at the age of 47, obesity, history of premature birth.  She is in her late second trimester and experiences fluctuations in blood pressure, typically in the 130s, occasionally rising into the 90s. She is on labetalol and amlodipine, with potential adjustments as she approaches 28 weeks of pregnancy.  She experiences shortness of breath, particularly when climbing stairs at work, which has worsened over the past week or two. She finds it difficult to lay flat at night due to shortness of breath and props her shoulder up even when lying on her side. She feels pressure, especially when climbing stairs, but no chest pain.  She has noticed swelling in her ankles and legs, particularly after being on her feet all day. Her socks have become tight, indicating swelling. She recalls wearing compression socks during her last pregnancy, which helped alleviate the swelling.   Prior CV Studies Reviewed: The following studies were reviewed today: Echo, nuclear stress test and duplex ultrasound  Past Medical History:  Diagnosis Date   Anxiety    Asthma    Blood per rectum 09/01/2020   Boils 09/01/2020   Constipation 09/01/2020   Dehydration 09/01/2020   Headache    Hematuria 09/01/2020   History of kidney stones    Hypertension    Hypertension affecting pregnancy, antepartum 09/13/2016   Kidney stones 09/01/2020   Loose stools  09/01/2020   Nausea and vomiting 09/01/2020   Vaginal Pap smear, abnormal    Vitamin D deficiency     Past Surgical History:  Procedure Laterality Date   CESAREAN SECTION N/A 09/27/2014   Procedure: CESAREAN SECTION;  Surgeon: Genia Del, MD;  Location: [facility];  Service: Obstetrics;  Laterality: N/A;   CESAREAN SECTION N/A 09/13/2016   Procedure: Repeat CESAREAN SECTION;  Surgeon: Shea Evans, MD;  Location: [facility];  Service: Obstetrics;  Laterality: N/A;  EDD: 09/26/16    CESAREAN SECTION N/A 04/16/2018   Procedure: Repeat CESAREAN SECTION;  Surgeon: Shea Evans, MD;  Location: [facility];  Service: Obstetrics;  Laterality: N/A;  EDD: 04/23/18   COLONOSCOPY     CYSTOSCOPY/URETEROSCOPY/HOLMIUM LASER/STENT PLACEMENT Right 09/13/2020   Procedure: CYSTOSCOPY/URETEROSCOPYLASER LITHOTRISY/STENT PLACEMENT;  Surgeon: Crist Fat, MD;  Location: [facility];  Service: Urology;  Laterality: Right;   KIDNEY STONE SURGERY     TONSILLECTOMY     WISDOM TOOTH EXTRACTION        OB History     Gravida  4   Para  3   Term  2   Preterm  1   AB  0   Living  3      SAB  0   IAB  0   Ectopic  0   Multiple  0   Live Births  3               Current Medications: Current Meds  Medication Sig  albuterol (PROVENTIL HFA;VENTOLIN HFA) 108 (90 BASE) MCG/ACT inhaler Inhale 2 puffs into the lungs every 6 (six) hours as needed for wheezing or shortness of breath.   aspirin EC 81 MG tablet Take 1 tablet (81 mg total) by mouth daily. Swallow whole.   busPIRone (BUSPAR) 10 MG tablet 10 mg daily as needed.   butalbital-acetaminophen-caffeine (FIORICET, ESGIC) 50-325-40 MG tablet Take 1 tablet by mouth 2 (two) times daily as needed for migraine.   cyclobenzaprine (FLEXERIL) 10 MG tablet Take by mouth.   labetalol (NORMODYNE) 100 MG tablet Take 100 mg by mouth 2 (two) times daily.   levothyroxine (SYNTHROID) 50 MCG tablet Take 1 tablet by mouth daily.    Nebivolol HCl 20 MG TABS Take 1 tablet (20 mg total) by mouth daily.   rosuvastatin (CRESTOR) 20 MG tablet Take 1 tablet (20 mg total) by mouth daily.   sertraline (ZOLOFT) 100 MG tablet Take 2 tablets by mouth daily.     Allergies:   Patient has no known allergies.   Social History   Socioeconomic History   Marital status: Married    Spouse name: Not on file   Number of children: 3   Years of education: Not on file   Highest education level: Not on file  Occupational History   Not on file  Tobacco Use   Smoking status: Never   Smokeless tobacco: Never  Vaping Use   Vaping status: Never Used  Substance and Sexual Activity   Alcohol use: Not Currently   Drug use: No   Sexual activity: Yes    Birth control/protection: Pill  Other Topics Concern   Not on file  Social History Narrative   Not on file   Social Drivers of Health   Financial Resource Strain: Low Risk  (07/16/2023)   Received from [facility]   Overall Financial Resource Strain (CARDIA)    Difficulty of Paying Living Expenses: Not very hard  Food Insecurity: No Food Insecurity (07/16/2023)   Received from [facility]   Hunger Vital Sign    Worried About Running Out of Food in the Last Year: Never true    Ran Out of Food in the Last Year: Never true  Transportation Needs: No Transportation Needs (07/16/2023)   Received from [facility] - Transportation    Lack of Transportation (Medical): No    Lack of Transportation (Non-Medical): No  Physical Activity: Sufficiently Active (07/16/2023)   [facility]   Exercise Vital Sign    Days of Exercise per Week: 5 days    Minutes of Exercise per Session: 30 min  Stress: No Stress Concern Present (07/16/2023)   Received from [facility] - Occupational Stress Questionnaire    Feeling of Stress : Only a little  Social Connections: Socially Integrated (07/16/2023)   Received from [facility]    Social Network    How would you rate your social network (family, work, friends)?: Good participation with social networks      Family History  Problem Relation Age of Onset   Heart disease Mother    Hypertension Mother    Heart attack Mother    Kidney disease Sister    Cancer Maternal Grandmother    Heart disease Maternal Grandfather    Diabetes Paternal Grandmother    Heart disease Maternal Aunt    Heart disease Maternal Uncle    Heart attack Maternal Uncle    Stomach cancer Neg Hx  Colon cancer Neg Hx    Esophageal cancer Neg Hx    Pancreatic cancer Neg Hx       ROS:   Please see the history of present illness.     All other systems reviewed and are negative.   Labs/EKG Reviewed:    EKG:   EKG shows sinus rhythm, heart rate 74 bpm Recent Labs: No results found for requested labs within last 365 days.   Recent Lipid Panel Lab Results  Component Value Date/Time   CHOL 188 11/18/2020 09:07 AM   TRIG 184 (H) 11/18/2020 09:07 AM   HDL 67 11/18/2020 09:07 AM   LDLCALC 90 11/18/2020 09:07 AM   LDLDIRECT 63 07/01/2019 08:44 AM    Physical Exam:    VS:  BP 122/70 (BP Location: Right Arm, Patient Position: Sitting, Cuff Size: Normal)   Pulse 82   Ht 5\' 2"  (1.575 m)   Wt 214 lb 12.8 oz (97.4 kg)   SpO2 99%   BMI 39.29 kg/m     Wt Readings from Last 3 Encounters:  08/22/23 214 lb 12.8 oz (97.4 kg)  06/13/23 204 lb 12.8 oz (92.9 kg)  05/06/23 208 lb 3.2 oz (94.4 kg)     GEN: Well nourished, well developed in no acute distress HEENT: Normal NECK: No JVD; No carotid bruits LYMPHATICS: No lymphadenopathy CARDIAC: RRR, no murmurs, rubs, gallops RESPIRATORY:  Clear to auscultation without rales, wheezing or rhonchi  ABDOMEN: Soft, non-tender, non-distended MUSCULOSKELETAL:  No edema; No deformity  SKIN: Warm and dry NEUROLOGIC:  Alert and oriented x 3 PSYCHIATRIC:  Normal affect    Risk Assessment/Risk Calculators:                  ASSESSMENT &  PLAN:    Chronic hypertension pregnancy,- managed with labetalol and amlodipine. Blood pressure generally in the 130s, occasionally rising. . Current regimen effective, adjustments may be needed. - Continue labetalol and amlodipine. - Monitor blood pressure regularly. - Increase labetalol to 200 mg twice daily if blood pressure rises to 140/90 or higher. - Communicate blood pressure readings via MyChart if concerning.  Orthopnea and dyspnea on exertion Shortness of breath when lying flat and after exertion, likely pregnancy-related. Symptoms worsened recently. Advised to avoid lying flat to prevent inferior vena cava compression. - Avoid lying flat. - Sleep on side with shoulder propped up.  Pregnancy-related edema Swelling in ankles and legs due to pregnancy. Compression stockings recommended. Lasix considered a last resort due to fetal growth impact. - Use medical-grade compression stockings (20-30 mmHg). - Avoid Lasix unless absolutely necessary.  Eustachian tube dysfunction Fluid in right ear causing discomfort, possibly related to mild seasonal allergies. Decongestants not recommended during pregnancy. - Continue using Flonase. - Avoid decongestants.  Coronary artery calcification/hyperlipidemia-holding statin during pregnancy.  High risk pregnancy-history of preeclampsia and premature birth in previous pregnancy.  Will start aspirin 81 mg daily for pregnancy.  Prophylaxis at this time.  Will continue monitoring.  Will follow-up with the patient in 4 weeks.    Patient Instructions  Medication Instructions:  Your physician recommends that you continue on your current medications as directed. Please refer to the Current Medication list given to you today.  *If you need a refill on your cardiac medications before your next appointment, please call your pharmacy*   Follow-Up: At [facility], you and your health needs are our priority.  As part of our continuing  mission to provide you with exceptional heart care, we have created  designated Provider Care Teams.  These Care Teams include your primary Cardiologist (physician) and Advanced Practice Providers (APPs -  Physician Assistants and Nurse Practitioners) who all work together to provide you with the care you need, when you need it.   Your next appointment:   12 week(s)  Provider:   Thomasene Ripple, DO     Other Instructions PLEASE PURCHASE AND WEAR COMPRESSION STOCKINGS DAILY AND TAKE OFF AT BEDTIME.  I recommend getting support socks/stockings. 20-30 mmHg is the preferred amount of compression.    Compression stockings are elastic socks that squeeze the legs. They help to increase blood flow to the legs and to decrease swelling in the legs from fluid retention, and reduce the chance of developing blood clots in the lower legs. Please put on in the AM when dressing and off at night when dressing for bed.   [employer]  [address] ([phone]); [place], [place] [address]; [phone]  EMAIL:   [email or web address].   PLEASE MAKE SURE TO ELEVATE YOUR FEET & LEGS ABOVE YOUR HEART WHILE SITTING, THIS WILL HELP WITH THE SWELLING ALSO.     Dispo:  No follow-ups on file.   Medication Adjustments/Labs and Tests Ordered: Current medicines are reviewed at length with the patient today.  Concerns regarding medicines are outlined above.  Tests Ordered: No orders of the defined types were placed in this encounter.  Medication Changes: No orders of the defined types were placed in this encounter.

## 2023-09-16 ENCOUNTER — Encounter: Payer: Self-pay | Admitting: Cardiology

## 2023-09-23 NOTE — Progress Notes (Deleted)
 Patient was seen on 09/25/2023 for Gestational Diabetes self-management class at the [facility]. The following learning objectives were met by the patient during this course:  States the definition of Gestational Diabetes States why dietary management is important in controlling blood glucose Describes the effects each nutrient has on blood glucose levels Demonstrates ability to create a balanced meal plan Demonstrates carbohydrate counting  States when to check blood glucose levels Demonstrates proper blood glucose monitoring techniques States the effect of stress and exercise on blood glucose levels States the importance of limiting caffeine  and abstaining from alcohol and smoking  Blood glucose monitor given: *** Lot # *** Exp: *** Blood glucose reading: ***  *** Patient has a meter prior to visit. Patient is *** testing pre breakfast and 2 hours after each meal. FBS: *** Postprandial: *** Blood glucose today in class ***  Patient instructed to monitor glucose levels: QID FBS: 60 - <90 1 hour: <140 2 hour: <120  *Patient received handouts: Nutrition Diabetes and Pregnancy Carbohydrate Counting List Blood glucose log Snack ideas for diabetes during pregnancy  Patient will be seen for follow-up as needed.

## 2023-09-25 ENCOUNTER — Ambulatory Visit

## 2023-09-25 DIAGNOSIS — O99810 Abnormal glucose complicating pregnancy: Secondary | ICD-10-CM

## 2023-09-30 ENCOUNTER — Encounter: Payer: Self-pay | Admitting: Skilled Nursing Facility1

## 2023-09-30 ENCOUNTER — Encounter: Attending: Obstetrics & Gynecology | Admitting: Skilled Nursing Facility1

## 2023-09-30 VITALS — Ht 62.0 in | Wt 214.9 lb

## 2023-09-30 DIAGNOSIS — O24419 Gestational diabetes mellitus in pregnancy, unspecified control: Secondary | ICD-10-CM | POA: Insufficient documentation

## 2023-09-30 NOTE — Progress Notes (Signed)
 Patient was seen for Gestational Diabetes on 09/30/2023  Start time 9:16 and End time 10:20   Estimated due date: July 11th   Clinical: Medications: none prescribed for GDM Medical History: HTN, anxiety, asthma, kidney stones Labs: OGTT fasting 94, 1 hour 234, 2 hour 202 on 09/18/2023  Dietary and Lifestyle History:  Pt states this is her 4th child.  Pt states the stove top has been broken for about 3 weeks.  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   Physical Activity: walking (unknown amount of time) Stress: 8 out of 10 due to work and "some stuff at home" Sleep: pt stated not great due to stress making it difficult to "turn her brain off"  24 hr Recall:  First Meal: half a bagel + cream cheese or fast food grits + butter Snack:   Second meal:  peanut butter jelly sandwich or leftovers  Snack:   Third meal 6-6:30: pasta + meat + broccoli + salad: cheese, iceburg, ranch or rice + payaya or yellow rice and chicken  Snack 8pm: strawberries or wheat thins + hummus Beverages:  water, 1% milk  NUTRITION INTERVENTION  Nutrition education (E-1) on the following topics:   Initial Follow-up  [x]  []  Definition of Gestational Diabetes [x]  []  Why dietary management is important in controlling blood glucose [x]  []  Effects each nutrient has on blood glucose levels [x]  []  Simple carbohydrates vs complex carbohydrates [x]  []  Fluid intake [x]  []  Creating a balanced meal plan [x]  []  Carbohydrate counting  [x]  []  When to check blood glucose levels [x]  []  Proper blood glucose monitoring techniques [x]  []  Effect of stress and stress reduction techniques  [x]  []  Exercise effect on blood glucose levels, appropriate exercise during pregnancy [x]  []  Importance of limiting caffeine  and abstaining from alcohol and smoking [x]  []  Medications used for blood sugar control during pregnancy [x]  []  Hypoglycemia and rule of 15 [x]  []  Postpartum self care     Patient has a meter prior to visit.  Patient is testing pre breakfast and 2 hours after each meal. FBS: 126, 133, 124, 115 Postprandial: 1124, 123, 136  *pt is under a significant amount of stress at home most likely contributing to those high blood sugars   Patient instructed to monitor glucose levels: FBS: 60 - <= 95 mg/dL; 2 hour: <= 161 mg/dL  Patient received handouts: Nutrition Diabetes and Pregnancy Carbohydrate Counting List Blood glucose log Snack ideas for diabetes during pregnancy  Patient will be seen for follow-up in 3 weeks

## 2023-10-22 ENCOUNTER — Encounter: Payer: Self-pay | Admitting: Skilled Nursing Facility1

## 2023-10-22 ENCOUNTER — Encounter: Attending: Obstetrics & Gynecology | Admitting: Skilled Nursing Facility1

## 2023-10-22 ENCOUNTER — Ambulatory Visit: Payer: Self-pay | Admitting: Cardiology

## 2023-10-22 DIAGNOSIS — O24419 Gestational diabetes mellitus in pregnancy, unspecified control: Secondary | ICD-10-CM | POA: Insufficient documentation

## 2023-10-22 NOTE — Progress Notes (Signed)
 Patient was seen for Gestational Diabetes Start time 11:45 and End time 12:03   Appointed conducted virtually: pt agreeable to limitations of this visit type and identified by name and DOB  Estimated due date: July 11th   Appt under 30 minutes unable to charge.    Clinical: Medications: none prescribed for GDM Medical History: HTN, anxiety, asthma, kidney stones Labs: OGTT fasting 94, 1 hour 234, 2 hour 202 on 09/18/2023  Dietary and Lifestyle History:   Pt states she started on insulin 6 units every evening before bed and is okay with having to take insulin. This morning fasting 116, 120 after breakfast  9:14pm 171 after 2 hotdogs with ketchup  Pt states she has beene ating whole wheat pasta.   Discussion In today's visit centered around meal creation to create a more leveled rise in blood sugar resulting in controlled sugars.  Pt is having sugars that make sense for the foods she is having lending to the suspicion of her hormones/stress levels having the biggest impact on her sugars.  Educated pt on site rotation of her insulin injection.   Physical Activity: walking (unknown amount of time) Stress: 8 out of 10 due to work and "some stuff at home" Sleep: pt stated not great due to stress making it difficult to "turn her brain off"  24 hr Recall:  First Meal: half a bagel + cream cheese or fast food grits + butter Snack:   Second meal:  peanut butter jelly sandwich or leftovers  Snack:   Third meal 6-6:30: pasta + meat + broccoli + salad: cheese, iceburg, ranch or rice + payaya or yellow rice and chicken  Snack 8pm: strawberries or wheat thins + hummus Beverages:  water, 1% milk  NUTRITION INTERVENTION  Nutrition education (E-1) on the following topics:   Initial Follow-up  [x]  []  Definition of Gestational Diabetes [x]  []  Why dietary management is important in controlling blood glucose [x]  []  Effects each nutrient has on blood glucose levels [x]  []  Simple  carbohydrates vs complex carbohydrates [x]  []  Fluid intake [x]  [x]  Creating a balanced meal plan [x]  [x]  Carbohydrate counting  [x]  []  When to check blood glucose levels [x]  [x]  Proper blood glucose monitoring techniques [x]  []  Effect of stress and stress reduction techniques  [x]  []  Exercise effect on blood glucose levels, appropriate exercise during pregnancy [x]  []  Importance of limiting caffeine  and abstaining from alcohol and smoking [x]  [x]  Medications used for blood sugar control during pregnancy [x]  [x]  Hypoglycemia and rule of 15 [x]  []  Postpartum self care     Patient has a meter prior to visit. Patient is testing pre breakfast and 2 hours after each meal. FBS: 126, 133, 124, 115 Postprandial: 1124, 123, 136  *pt is under a significant amount of stress at home most likely contributing to those high blood sugars   Patient instructed to monitor glucose levels: FBS: 60 - <= 95 mg/dL; 2 hour: <= 295 mg/dL  Patient received handouts: Nutrition Diabetes and Pregnancy Carbohydrate Counting List Blood glucose log Snack ideas for diabetes during pregnancy  Patient will be seen for follow-up as needed

## 2023-10-24 ENCOUNTER — Other Ambulatory Visit: Payer: Self-pay | Admitting: Obstetrics & Gynecology

## 2023-11-15 ENCOUNTER — Ambulatory Visit: Attending: Cardiology | Admitting: Cardiology

## 2023-11-15 ENCOUNTER — Encounter: Payer: Self-pay | Admitting: Cardiology

## 2023-11-15 VITALS — BP 114/82 | HR 83 | Ht 62.0 in | Wt 211.8 lb

## 2023-11-15 DIAGNOSIS — O10919 Unspecified pre-existing hypertension complicating pregnancy, unspecified trimester: Secondary | ICD-10-CM | POA: Diagnosis not present

## 2023-11-15 DIAGNOSIS — I2584 Coronary atherosclerosis due to calcified coronary lesion: Secondary | ICD-10-CM | POA: Diagnosis not present

## 2023-11-15 DIAGNOSIS — O0993 Supervision of high risk pregnancy, unspecified, third trimester: Secondary | ICD-10-CM

## 2023-11-15 DIAGNOSIS — Z3A39 39 weeks gestation of pregnancy: Secondary | ICD-10-CM

## 2023-11-15 DIAGNOSIS — I2510 Atherosclerotic heart disease of native coronary artery without angina pectoris: Secondary | ICD-10-CM | POA: Diagnosis not present

## 2023-11-15 NOTE — Progress Notes (Signed)
 Cardio-Obstetrics Clinic  Follow Up Note   Date:  11/15/2023   ID:  Elizabeth Yang, DOB 02-07-1981, MRN [id number]  PCP:  Authur Leghorn, MD    [facility] Providers Cardiologist:  Jerryl Morin, DO  Electrophysiologist:  None        Referring MD: Authur Leghorn, MD   Chief Complaint:  I am doing ok:  History of Present Illness:    Elizabeth Yang is a 43 y.o. female [G4P2103] who returns for follow up cardiovascular care in pregnancy.   Medical history includes coronary artery calcification, hyperlipidemia, hypertension which was diagnosed at the age of 78, obesity, history of premature birth.  Her last visit with me was on August 22, 2023 at that time I increased her labetalol  to 200 mg twice a day.  She tells me since that time she has been doing well.  No complaints at this time.  She is looking forward to delivery.  She has been scheduled for her C-section on July 4.  Prior CV Studies Reviewed: The following studies were reviewed today: Echo, nuclear stress test and duplex ultrasound  Past Medical History:  Diagnosis Date   Anxiety    Asthma    Blood per rectum 09/01/2020   Boils 09/01/2020   Constipation 09/01/2020   Dehydration 09/01/2020   Diabetes mellitus without complication (HCC)    Headache    Hematuria 09/01/2020   History of kidney stones    Hypertension    Hypertension affecting pregnancy, antepartum 09/13/2016   Kidney stones 09/01/2020   Loose stools 09/01/2020   Nausea and vomiting 09/01/2020   Vaginal Pap smear, abnormal    Vitamin D  deficiency     Past Surgical History:  Procedure Laterality Date   CESAREAN SECTION N/A 09/27/2014   Procedure: CESAREAN SECTION;  Surgeon: Marie-Lyne Lavoie, MD;  Location: [facility];  Service: Obstetrics;  Laterality: N/A;   CESAREAN SECTION N/A 09/13/2016   Procedure: Repeat CESAREAN SECTION;  Surgeon: Terri Fester, MD;  Location: [facility] BIRTHING SUITES;  Service: Obstetrics;  Laterality:  N/A;  EDD: 09/26/16    CESAREAN SECTION N/A 04/16/2018   Procedure: Repeat CESAREAN SECTION;  Surgeon: Terri Fester, MD;  Location: [facility] BIRTHING SUITES;  Service: Obstetrics;  Laterality: N/A;  EDD: 04/23/18   COLONOSCOPY     CYSTOSCOPY/URETEROSCOPY/HOLMIUM LASER/STENT PLACEMENT Right 09/13/2020   Procedure: CYSTOSCOPY/URETEROSCOPYLASER LITHOTRISY/STENT PLACEMENT;  Surgeon: Andrez Banker, MD;  Location: [facility];  Service: Urology;  Laterality: Right;   KIDNEY STONE SURGERY     TONSILLECTOMY     WISDOM TOOTH EXTRACTION        OB History     Gravida  4   Para  3   Term  2   Preterm  1   AB  0   Living  3      SAB  0   IAB  0   Ectopic  0   Multiple  0   Live Births  3               Current Medications: Current Meds  Medication Sig   albuterol  (PROVENTIL  HFA;VENTOLIN  HFA) 108 (90 BASE) MCG/ACT inhaler Inhale 2 puffs into the lungs every 6 (six) hours as needed for wheezing or shortness of breath.   amLODipine  (NORVASC ) 10 MG tablet Take 1 tablet (10 mg total) by mouth daily.   aspirin  EC 81 MG tablet Take 1 tablet (81 mg total) by mouth daily. Swallow whole.   busPIRone (BUSPAR) 10 MG  tablet 10 mg daily as needed.   butalbital -acetaminophen -caffeine  (FIORICET , ESGIC ) 50-325-40 MG tablet Take 1 tablet by mouth 2 (two) times daily as needed for migraine.   cyclobenzaprine (FLEXERIL) 10 MG tablet Take by mouth.   levothyroxine (SYNTHROID) 50 MCG tablet Take 1 tablet by mouth daily.   rosuvastatin  (CRESTOR ) 20 MG tablet Take 1 tablet (20 mg total) by mouth daily.   sertraline (ZOLOFT) 100 MG tablet Take 2 tablets by mouth daily.   [DISCONTINUED] labetalol  (NORMODYNE ) 100 MG tablet Take 100 mg by mouth 2 (two) times daily.   [DISCONTINUED] Nebivolol  HCl 20 MG TABS Take 1 tablet (20 mg total) by mouth daily.     Allergies:   Patient has no known allergies.   Social History   Socioeconomic History   Marital status: Married    Spouse name: Not on file    Number of children: 3   Years of education: Not on file   Highest education level: Not on file  Occupational History   Not on file  Tobacco Use   Smoking status: Never   Smokeless tobacco: Never  Vaping Use   Vaping status: Never Used  Substance and Sexual Activity   Alcohol use: Not Currently   Drug use: No   Sexual activity: Yes    Birth control/protection: Pill  Other Topics Concern   Not on file  Social History Narrative   Not on file   Social Drivers of Health   Financial Resource Strain: Low Risk  (07/16/2023)   Received from [facility]   Overall Financial Resource Strain (CARDIA)    Difficulty of Paying Living Expenses: Not very hard  Food Insecurity: No Food Insecurity (07/16/2023)   Received from [facility]   Hunger Vital Sign    Within the past 12 months, you worried that your food would run out before you got the money to buy more.: Never true    Within the past 12 months, the food you bought just didn't last and you didn't have money to get more.: Never true  Transportation Needs: No Transportation Needs (07/16/2023)   Received from [facility] - Transportation    Lack of Transportation (Medical): No    Lack of Transportation (Non-Medical): No  Physical Activity: Sufficiently Active (07/16/2023)   Received from [facility]   Exercise Vital Sign    On average, how many days per week do you engage in moderate to strenuous exercise (like a brisk walk)?: 5 days    On average, how many minutes do you engage in exercise at this level?: 30 min  Stress: No Stress Concern Present (07/16/2023)   Received from [facility] - Occupational Stress Questionnaire    Feeling of Stress : Only a little  Social Connections: Socially Integrated (07/16/2023)   Received from [facility]   Social Network    How would you rate your social network (family, work, friends)?: Good participation with social networks       Family History  Problem Relation Age of Onset   Heart disease Mother    Hypertension Mother    Heart attack Mother    Kidney disease Sister    Cancer Maternal Grandmother    Heart disease Maternal Grandfather    Diabetes Paternal Grandmother    Heart disease Maternal Aunt    Heart disease Maternal Uncle    Heart attack Maternal Uncle    Stomach cancer Neg Hx    Colon  cancer Neg Hx    Esophageal cancer Neg Hx    Pancreatic cancer Neg Hx       ROS:   Please see the history of present illness.     All other systems reviewed and are negative.   Labs/EKG Reviewed:    EKG:   EKG shows sinus rhythm, heart rate 74 bpm Recent Labs: No results found for requested labs within last 365 days.   Recent Lipid Panel Lab Results  Component Value Date/Time   CHOL 188 11/18/2020 09:07 AM   TRIG 184 (H) 11/18/2020 09:07 AM   HDL 67 11/18/2020 09:07 AM   LDLCALC 90 11/18/2020 09:07 AM   LDLDIRECT 63 07/01/2019 08:44 AM    Physical Exam:    VS:  BP 114/82 (BP Location: Left Arm, Patient Position: Sitting, Cuff Size: Normal)   Pulse 83   Ht 5' 2 (1.575 m)   Wt 211 lb 12.8 oz (96.1 kg)   SpO2 98%   BMI 38.74 kg/m     Wt Readings from Last 3 Encounters:  11/15/23 211 lb 12.8 oz (96.1 kg)  09/30/23 214 lb 14.4 oz (97.5 kg)  08/22/23 214 lb 12.8 oz (97.4 kg)     GEN: Well nourished, well developed in no acute distress HEENT: Normal NECK: No JVD; No carotid bruits LYMPHATICS: No lymphadenopathy CARDIAC: RRR, no murmurs, rubs, gallops RESPIRATORY:  Clear to auscultation without rales, wheezing or rhonchi  ABDOMEN: Soft, non-tender, non-distended MUSCULOSKELETAL:  No edema; No deformity  SKIN: Warm and dry NEUROLOGIC:  Alert and oriented x 3 PSYCHIATRIC:  Normal affect    Risk Assessment/Risk Calculators:                  ASSESSMENT & PLAN:    Chronic hypertension pregnancy,- managed with labetalol  and amlodipine .  Blood pressure is at target this is really  good.  Continue her current regimen.     Coronary artery calcification/hyperlipidemia-holding statin during pregnancy.  High risk pregnancy-history of preeclampsia and premature birth in previous pregnancy.  Will start aspirin  81 mg daily for pregnancy.  Prophylaxis at this time.  Will continue monitoring.  Will follow-up with the patient in a postpartum.  She has been scheduled for her visit for July 11.    Patient Instructions  Medication Instructions:  Your physician recommends that you continue on your current medications as directed. Please refer to the Current Medication list given to you today.  *If you need a refill on your cardiac medications before your next appointment, please call your pharmacy*   Follow-Up: At [facility], you and your health needs are our priority.  As part of our continuing mission to provide you with exceptional heart care, our providers are all part of one team.  This team includes your primary Cardiologist (physician) and Advanced Practice Providers or APPs (Physician Assistants and Nurse Practitioners) who all work together to provide you with the care you need, when you need it.  Your next appointment:    July 11th at 8:20am via MyChart  Provider:   Brizza Nathanson, DO          Dispo:  No follow-ups on file.   Medication Adjustments/Labs and Tests Ordered: Current medicines are reviewed at length with the patient today.  Concerns regarding medicines are outlined above.  Tests Ordered: No orders of the defined types were placed in this encounter.  Medication Changes: No orders of the defined types were placed in this encounter.

## 2023-11-15 NOTE — Patient Instructions (Signed)
 Medication Instructions:  Your physician recommends that you continue on your current medications as directed. Please refer to the Current Medication list given to you today.  *If you need a refill on your cardiac medications before your next appointment, please call your pharmacy*   Follow-Up: At [facility], you and your health needs are our priority.  As part of our continuing mission to provide you with exceptional heart care, our providers are all part of one team.  This team includes your primary Cardiologist (physician) and Advanced Practice Providers or APPs (Physician Assistants and Nurse Practitioners) who all work together to provide you with the care you need, when you need it.  Your next appointment:    July 11th at 8:20am via MyChart  Provider:   Kardie Tobb, DO

## 2023-11-21 ENCOUNTER — Encounter (HOSPITAL_COMMUNITY): Payer: Self-pay

## 2023-11-21 ENCOUNTER — Telehealth (HOSPITAL_COMMUNITY): Payer: Self-pay | Admitting: *Deleted

## 2023-11-21 NOTE — Patient Instructions (Addendum)
 Elizabeth Yang  11/21/2023   Your procedure is scheduled on:  12/06/2023  Arrive at 0800 at Entrance C on [address] at [facility]  and [employer]. You are invited to use the FREE valet parking or use the Visitor's parking deck.  Pick up the phone at the desk and dial [phone].  Call this number if you have problems the morning of surgery: [phone]  Remember:   Do not eat food:(After Midnight) Desps de medianoche.  You may drink clear liquids until  ___0600__.  Clear liquids means a liquid you can see thru.  It can have color such as Cola or Kool aid.  Tea is OK and coffee as long as no milk or creamer of any kind.  Take these medicines the morning of surgery with A SIP OF WATER:  Take labetalol , zoloft, levothyroxine, as prescribed.  Take half of the prescribed insulin the night before surgery.     Do not wear jewelry, make-up or nail polish.  Do not wear lotions, powders, or perfumes. Do not wear deodorant.  Do not shave 48 hours prior to surgery.  Do not bring valuables to the hospital.  [facility] is not   responsible for any belongings or valuables brought to the hospital.  Contacts, dentures or bridgework may not be worn into surgery.  Leave suitcase in the car. After surgery it may be brought to your room.  For patients admitted to the hospital, checkout time is 11:00 AM the day of              discharge.      Please read over the following fact sheets that you were given:     Preparing for Surgery

## 2023-11-21 NOTE — Telephone Encounter (Signed)
 Preadmission screen

## 2023-11-26 ENCOUNTER — Encounter (HOSPITAL_COMMUNITY): Payer: Self-pay

## 2023-11-26 NOTE — Patient Instructions (Signed)
 Elizabeth Yang  11/26/2023   Your procedure is scheduled on:  11/29/2023  Arrive at 1215 at Entrance C on [address] at [facility]  and [employer]. You are invited to use the FREE valet parking or use the Visitor's parking deck.  Pick up the phone at the desk and dial [phone].  Call this number if you have problems the morning of surgery: [phone]  Remember:   Do not eat food:(After Midnight) Desps de medianoche.  You may drink clear liquids until  ___1015__.  Clear liquids means a liquid you can see thru.  It can have color such as Cola or Kool aid.  Tea is OK and coffee as long as no milk or creamer of any kind.  Take these medicines the morning of surgery with A SIP OF WATER:  Bring your inhaler with you.  Take levothyroxine, labetalol  and zoloft as prescribed.   Do not wear jewelry, make-up or nail polish.  Do not wear lotions, powders, or perfumes. Do not wear deodorant.  Do not shave 48 hours prior to surgery.  Do not bring valuables to the hospital.  [facility] is not   responsible for any belongings or valuables brought to the hospital.  Contacts, dentures or bridgework may not be worn into surgery.  Leave suitcase in the car. After surgery it may be brought to your room.  For patients admitted to the hospital, checkout time is 11:00 AM the day of              discharge.      Please read over the following fact sheets that you were given:     Preparing for Surgery

## 2023-11-27 ENCOUNTER — Encounter (HOSPITAL_COMMUNITY)
Admission: RE | Admit: 2023-11-27 | Discharge: 2023-11-27 | Disposition: A | Discharge status: Home / Self Care | Source: Ambulatory Visit | Attending: Obstetrics & Gynecology | Admitting: Obstetrics & Gynecology

## 2023-11-27 DIAGNOSIS — Z01812 Encounter for preprocedural laboratory examination: Secondary | ICD-10-CM | POA: Insufficient documentation

## 2023-11-27 LAB — CBC
HCT: 35.9 % — ABNORMAL LOW (ref 36.0–46.0)
Hemoglobin: 12.3 g/dL (ref 12.0–15.0)
MCH: 30 pg (ref 26.0–34.0)
MCHC: 34.3 g/dL (ref 30.0–36.0)
MCV: 87.6 fL (ref 80.0–100.0)
Platelets: 195 10*3/uL (ref 150–400)
RBC: 4.1 MIL/uL (ref 3.87–5.11)
RDW: 14.1 % (ref 11.5–15.5)
WBC: 10 10*3/uL (ref 4.0–10.5)
nRBC: 0 % (ref 0.0–0.2)

## 2023-11-27 LAB — RPR: RPR Ser Ql: NONREACTIVE

## 2023-11-27 LAB — TYPE AND SCREEN
ABO/RH(D): A POS
Antibody Screen: NEGATIVE

## 2023-11-29 ENCOUNTER — Other Ambulatory Visit: Payer: Self-pay

## 2023-11-29 ENCOUNTER — Inpatient Hospital Stay (HOSPITAL_COMMUNITY): Admitting: Anesthesiology

## 2023-11-29 ENCOUNTER — Encounter (HOSPITAL_COMMUNITY): Payer: Self-pay | Admitting: Obstetrics & Gynecology

## 2023-11-29 ENCOUNTER — Inpatient Hospital Stay (HOSPITAL_COMMUNITY)
Admission: RE | Admit: 2023-11-29 | Discharge: 2023-12-01 | DRG: 784 | Disposition: A | Discharge status: Home / Self Care | Attending: Obstetrics & Gynecology | Admitting: Obstetrics & Gynecology

## 2023-11-29 ENCOUNTER — Encounter (HOSPITAL_COMMUNITY)
Admission: RE | Disposition: A | Discharge status: Home / Self Care | Payer: Self-pay | Source: Home / Self Care | Attending: Obstetrics & Gynecology

## 2023-11-29 DIAGNOSIS — Z302 Encounter for sterilization: Secondary | ICD-10-CM

## 2023-11-29 DIAGNOSIS — F32A Depression, unspecified: Secondary | ICD-10-CM | POA: Diagnosis present

## 2023-11-29 DIAGNOSIS — Z833 Family history of diabetes mellitus: Secondary | ICD-10-CM | POA: Diagnosis not present

## 2023-11-29 DIAGNOSIS — Z8249 Family history of ischemic heart disease and other diseases of the circulatory system: Secondary | ICD-10-CM

## 2023-11-29 DIAGNOSIS — O1092 Unspecified pre-existing hypertension complicating childbirth: Secondary | ICD-10-CM | POA: Diagnosis present

## 2023-11-29 DIAGNOSIS — O99892 Other specified diseases and conditions complicating childbirth: Secondary | ICD-10-CM | POA: Diagnosis present

## 2023-11-29 DIAGNOSIS — O34211 Maternal care for low transverse scar from previous cesarean delivery: Secondary | ICD-10-CM

## 2023-11-29 DIAGNOSIS — O99344 Other mental disorders complicating childbirth: Secondary | ICD-10-CM | POA: Diagnosis present

## 2023-11-29 DIAGNOSIS — Z3A37 37 weeks gestation of pregnancy: Secondary | ICD-10-CM | POA: Diagnosis not present

## 2023-11-29 DIAGNOSIS — F419 Anxiety disorder, unspecified: Secondary | ICD-10-CM | POA: Diagnosis present

## 2023-11-29 DIAGNOSIS — E039 Hypothyroidism, unspecified: Secondary | ICD-10-CM | POA: Diagnosis present

## 2023-11-29 DIAGNOSIS — O99284 Endocrine, nutritional and metabolic diseases complicating childbirth: Secondary | ICD-10-CM | POA: Diagnosis present

## 2023-11-29 DIAGNOSIS — N736 Female pelvic peritoneal adhesions (postinfective): Secondary | ICD-10-CM | POA: Diagnosis present

## 2023-11-29 DIAGNOSIS — O24425 Gestational diabetes mellitus in childbirth, controlled by oral hypoglycemic drugs: Secondary | ICD-10-CM | POA: Diagnosis present

## 2023-11-29 DIAGNOSIS — Z98891 History of uterine scar from previous surgery: Secondary | ICD-10-CM

## 2023-11-29 LAB — GLUCOSE, CAPILLARY
Glucose-Capillary: 82 mg/dL (ref 70–99)
Glucose-Capillary: 89 mg/dL (ref 70–99)
Glucose-Capillary: 141 mg/dL — ABNORMAL HIGH (ref 70–99)

## 2023-11-29 SURGERY — Surgical Case
Anesthesia: Spinal | Laterality: Bilateral

## 2023-11-29 MED ORDER — SIMETHICONE 80 MG PO CHEW
80.0000 mg | CHEWABLE_TABLET | Freq: Three times a day (TID) | ORAL | Status: DC
  Administered 2023-11-30 – 2023-12-01 (×4): 80 mg via ORAL
  Filled 2023-11-29 (×4): qty 1

## 2023-11-29 MED ORDER — GABAPENTIN 300 MG PO CAPS
ORAL_CAPSULE | ORAL | Status: AC
  Filled 2023-11-29: qty 1

## 2023-11-29 MED ORDER — DIPHENHYDRAMINE HCL 50 MG/ML IJ SOLN: 12.5000 mg | INTRAMUSCULAR | Status: DC | PRN

## 2023-11-29 MED ORDER — ONDANSETRON HCL 4 MG/2ML IJ SOLN: 4.0000 mg | Freq: Three times a day (TID) | INTRAMUSCULAR | Status: DC | PRN

## 2023-11-29 MED ORDER — KETOROLAC TROMETHAMINE 30 MG/ML IJ SOLN
30.0000 mg | Freq: Four times a day (QID) | INTRAMUSCULAR | Status: AC
  Administered 2023-11-29 – 2023-11-30 (×4): 30 mg via INTRAVENOUS
  Filled 2023-11-29 (×4): qty 1

## 2023-11-29 MED ORDER — LACTATED RINGERS IV SOLN: INTRAVENOUS | Status: DC

## 2023-11-29 MED ORDER — DIPHENHYDRAMINE HCL 25 MG PO CAPS: 25.0000 mg | ORAL_CAPSULE | ORAL | Status: DC | PRN

## 2023-11-29 MED ORDER — ALBUTEROL SULFATE (2.5 MG/3ML) 0.083% IN NEBU: 2.5000 mg | INHALATION_SOLUTION | Freq: Four times a day (QID) | RESPIRATORY_TRACT | Status: DC | PRN

## 2023-11-29 MED ORDER — ACETAMINOPHEN 500 MG PO TABS
ORAL_TABLET | ORAL | Status: AC
Start: 2023-11-29 — End: 2023-11-29
  Filled 2023-11-29: qty 2

## 2023-11-29 MED ORDER — STERILE WATER FOR IRRIGATION IR SOLN
Status: DC | PRN
  Administered 2023-11-29: 1000 mL

## 2023-11-29 MED ORDER — SENNOSIDES-DOCUSATE SODIUM 8.6-50 MG PO TABS
2.0000 | ORAL_TABLET | Freq: Every day | ORAL | Status: DC
Start: 2023-11-30 — End: 2023-12-01
  Administered 2023-11-30: 2 via ORAL
  Filled 2023-11-29: qty 2

## 2023-11-29 MED ORDER — SOD CITRATE-CITRIC ACID 500-334 MG/5ML PO SOLN
30.0000 mL | Freq: Once | ORAL | Status: AC
  Administered 2023-11-29: 30 mL via ORAL

## 2023-11-29 MED ORDER — ZOLPIDEM TARTRATE 5 MG PO TABS: 5.0000 mg | ORAL_TABLET | Freq: Every evening | ORAL | Status: DC | PRN

## 2023-11-29 MED ORDER — FENTANYL CITRATE (PF) 100 MCG/2ML IJ SOLN
INTRAMUSCULAR | Status: AC
  Filled 2023-11-29: qty 2

## 2023-11-29 MED ORDER — NALOXONE HCL 4 MG/10ML IJ SOLN: 1.0000 ug/kg/h | INTRAVENOUS | Status: DC | PRN

## 2023-11-29 MED ORDER — TRANEXAMIC ACID-NACL 1000-0.7 MG/100ML-% IV SOLN
INTRAVENOUS | Status: DC | PRN
  Administered 2023-11-29: 1000 mg via INTRAVENOUS

## 2023-11-29 MED ORDER — DEXAMETHASONE SODIUM PHOSPHATE 10 MG/ML IJ SOLN
INTRAMUSCULAR | Status: AC
  Filled 2023-11-29: qty 1

## 2023-11-29 MED ORDER — HYDROMORPHONE HCL 1 MG/ML IJ SOLN: 0.2500 mg | INTRAMUSCULAR | Status: DC | PRN

## 2023-11-29 MED ORDER — PRENATAL 27-0.8 MG PO TABS: 1.0000 | ORAL_TABLET | Freq: Every day | ORAL | Status: DC

## 2023-11-29 MED ORDER — ACETAMINOPHEN 500 MG PO TABS
1000.0000 mg | ORAL_TABLET | Freq: Four times a day (QID) | ORAL | Status: DC
  Administered 2023-11-29 – 2023-12-01 (×6): 1000 mg via ORAL
  Filled 2023-11-29 (×6): qty 2

## 2023-11-29 MED ORDER — LABETALOL HCL 200 MG PO TABS
200.0000 mg | ORAL_TABLET | Freq: Two times a day (BID) | ORAL | Status: DC
  Administered 2023-11-29 – 2023-12-01 (×4): 200 mg via ORAL
  Filled 2023-11-29 (×4): qty 1

## 2023-11-29 MED ORDER — PRENATAL MULTIVITAMIN CH
1.0000 | ORAL_TABLET | Freq: Every day | ORAL | Status: DC
Start: 2023-11-30 — End: 2023-12-01
  Administered 2023-11-30 – 2023-12-01 (×2): 1 via ORAL
  Filled 2023-11-29 (×2): qty 1

## 2023-11-29 MED ORDER — LEVOTHYROXINE SODIUM 75 MCG PO TABS
75.0000 ug | ORAL_TABLET | ORAL | Status: DC
  Administered 2023-11-30 – 2023-12-01 (×2): 75 ug via ORAL
  Filled 2023-11-29 (×2): qty 1

## 2023-11-29 MED ORDER — PHENYLEPHRINE HCL-NACL 20-0.9 MG/250ML-% IV SOLN
INTRAVENOUS | Status: DC | PRN
  Administered 2023-11-29: 60 ug/min via INTRAVENOUS

## 2023-11-29 MED ORDER — SCOPOLAMINE 1 MG/3DAYS TD PT72
1.0000 | MEDICATED_PATCH | Freq: Once | TRANSDERMAL | Status: DC
  Administered 2023-11-29: 1.5 mg via TRANSDERMAL

## 2023-11-29 MED ORDER — WITCH HAZEL-GLYCERIN EX PADS: 1.0000 | MEDICATED_PAD | CUTANEOUS | Status: DC | PRN

## 2023-11-29 MED ORDER — DROPERIDOL 2.5 MG/ML IJ SOLN
0.6250 mg | Freq: Once | INTRAMUSCULAR | Status: DC | PRN
Start: 2023-11-29 — End: 2023-11-29

## 2023-11-29 MED ORDER — ONDANSETRON HCL 4 MG/2ML IJ SOLN
INTRAMUSCULAR | Status: AC
Start: 2023-11-29 — End: 2023-11-29
  Filled 2023-11-29: qty 2

## 2023-11-29 MED ORDER — BUTALBITAL-APAP-CAFFEINE 50-325-40 MG PO TABS: 1.0000 | ORAL_TABLET | Freq: Two times a day (BID) | ORAL | Status: DC | PRN

## 2023-11-29 MED ORDER — CYCLOBENZAPRINE HCL 5 MG PO TABS: 10.0000 mg | ORAL_TABLET | Freq: Every day | ORAL | Status: DC | PRN

## 2023-11-29 MED ORDER — FENTANYL CITRATE (PF) 100 MCG/2ML IJ SOLN
INTRAMUSCULAR | Status: DC | PRN
  Administered 2023-11-29: 15 ug via INTRATHECAL

## 2023-11-29 MED ORDER — DEXAMETHASONE SODIUM PHOSPHATE 10 MG/ML IJ SOLN
INTRAMUSCULAR | Status: DC | PRN
  Administered 2023-11-29 (×2): 5 mg via INTRAVENOUS

## 2023-11-29 MED ORDER — AMITRIPTYLINE HCL 25 MG PO TABS: 25.0000 mg | ORAL_TABLET | Freq: Every day | ORAL | Status: DC | PRN

## 2023-11-29 MED ORDER — MENTHOL 3 MG MT LOZG: 1.0000 | LOZENGE | OROMUCOSAL | Status: DC | PRN

## 2023-11-29 MED ORDER — AMLODIPINE BESYLATE 5 MG PO TABS
10.0000 mg | ORAL_TABLET | Freq: Every day | ORAL | Status: DC
  Administered 2023-11-29 – 2023-11-30 (×2): 10 mg via ORAL
  Filled 2023-11-29 (×2): qty 2

## 2023-11-29 MED ORDER — SOD CITRATE-CITRIC ACID 500-334 MG/5ML PO SOLN
ORAL | Status: AC
  Filled 2023-11-29: qty 30

## 2023-11-29 MED ORDER — BUPIVACAINE IN DEXTROSE 0.75-8.25 % IT SOLN
INTRATHECAL | Status: DC | PRN
  Administered 2023-11-29: 1.6 mL via INTRATHECAL

## 2023-11-29 MED ORDER — SIMETHICONE 80 MG PO CHEW: 80.0000 mg | CHEWABLE_TABLET | ORAL | Status: DC | PRN

## 2023-11-29 MED ORDER — OXYTOCIN-SODIUM CHLORIDE 30-0.9 UT/500ML-% IV SOLN
2.5000 [IU]/h | INTRAVENOUS | Status: AC
  Filled 2023-11-29: qty 500

## 2023-11-29 MED ORDER — SERTRALINE HCL 100 MG PO TABS
100.0000 mg | ORAL_TABLET | Freq: Every day | ORAL | Status: DC
  Administered 2023-11-30 – 2023-12-01 (×2): 100 mg via ORAL
  Filled 2023-11-29 (×2): qty 1

## 2023-11-29 MED ORDER — DIPHENHYDRAMINE HCL 25 MG PO CAPS: 25.0000 mg | ORAL_CAPSULE | Freq: Four times a day (QID) | ORAL | Status: DC | PRN

## 2023-11-29 MED ORDER — MEPERIDINE HCL 25 MG/ML IJ SOLN: 6.2500 mg | INTRAMUSCULAR | Status: DC | PRN

## 2023-11-29 MED ORDER — COCONUT OIL OIL: 1.0000 | TOPICAL_OIL | Status: DC | PRN

## 2023-11-29 MED ORDER — PHENYLEPHRINE HCL-NACL 20-0.9 MG/250ML-% IV SOLN
INTRAVENOUS | Status: AC
  Filled 2023-11-29: qty 250

## 2023-11-29 MED ORDER — GABAPENTIN 300 MG PO CAPS
300.0000 mg | ORAL_CAPSULE | Freq: Once | ORAL | Status: AC
  Administered 2023-11-29: 300 mg via ORAL

## 2023-11-29 MED ORDER — NALOXONE HCL 0.4 MG/ML IJ SOLN: 0.4000 mg | INTRAMUSCULAR | Status: DC | PRN

## 2023-11-29 MED ORDER — MORPHINE SULFATE (PF) 0.5 MG/ML IJ SOLN
INTRAMUSCULAR | Status: AC
  Filled 2023-11-29: qty 10

## 2023-11-29 MED ORDER — OXYCODONE HCL 5 MG PO TABS: 5.0000 mg | ORAL_TABLET | ORAL | Status: DC | PRN

## 2023-11-29 MED ORDER — SODIUM CHLORIDE 0.9% FLUSH: 3.0000 mL | INTRAVENOUS | Status: DC | PRN

## 2023-11-29 MED ORDER — POVIDONE-IODINE 10 % EX SWAB
2.0000 | Freq: Once | CUTANEOUS | Status: AC
Start: 2023-11-29 — End: 2023-11-29
  Administered 2023-11-29: 2 via TOPICAL

## 2023-11-29 MED ORDER — DIBUCAINE (PERIANAL) 1 % EX OINT: 1.0000 | TOPICAL_OINTMENT | CUTANEOUS | Status: DC | PRN

## 2023-11-29 MED ORDER — LEVOTHYROXINE SODIUM 50 MCG PO TABS: 50.0000 ug | ORAL_TABLET | ORAL | Status: DC

## 2023-11-29 MED ORDER — CEFAZOLIN SODIUM-DEXTROSE 2-4 GM/100ML-% IV SOLN
INTRAVENOUS | Status: AC
  Filled 2023-11-29: qty 100

## 2023-11-29 MED ORDER — ACETAMINOPHEN 500 MG PO TABS
1000.0000 mg | ORAL_TABLET | Freq: Once | ORAL | Status: AC
Start: 2023-11-29 — End: 2023-11-29
  Administered 2023-11-29: 1000 mg via ORAL

## 2023-11-29 MED ORDER — SCOPOLAMINE 1 MG/3DAYS TD PT72
MEDICATED_PATCH | TRANSDERMAL | Status: AC
  Filled 2023-11-29: qty 1

## 2023-11-29 MED ORDER — TRANEXAMIC ACID-NACL 1000-0.7 MG/100ML-% IV SOLN
INTRAVENOUS | Status: AC
  Filled 2023-11-29: qty 100

## 2023-11-29 MED ORDER — ONDANSETRON HCL 4 MG/2ML IJ SOLN
INTRAMUSCULAR | Status: DC | PRN
  Administered 2023-11-29: 4 mg via INTRAVENOUS

## 2023-11-29 MED ORDER — MORPHINE SULFATE (PF) 0.5 MG/ML IJ SOLN
INTRAMUSCULAR | Status: DC | PRN
  Administered 2023-11-29: 150 ug via INTRATHECAL

## 2023-11-29 MED ORDER — OXYTOCIN-SODIUM CHLORIDE 30-0.9 UT/500ML-% IV SOLN
INTRAVENOUS | Status: AC
Start: 2023-11-29 — End: 2023-11-29
  Filled 2023-11-29: qty 500

## 2023-11-29 MED ORDER — IBUPROFEN 600 MG PO TABS
600.0000 mg | ORAL_TABLET | Freq: Four times a day (QID) | ORAL | Status: DC
  Administered 2023-11-30 – 2023-12-01 (×4): 600 mg via ORAL
  Filled 2023-11-29 (×4): qty 1

## 2023-11-29 MED ORDER — CEFAZOLIN SODIUM-DEXTROSE 2-4 GM/100ML-% IV SOLN
2.0000 g | INTRAVENOUS | Status: AC
  Administered 2023-11-29: 2 g via INTRAVENOUS

## 2023-11-29 SURGICAL SUPPLY — 31 items
BARRIER ADHS 3X4 INTERCEED (GAUZE/BANDAGES/DRESSINGS) IMPLANT
BENZOIN TINCTURE PRP APPL 2/3 (GAUZE/BANDAGES/DRESSINGS) IMPLANT
CHLORAPREP W/TINT 26ML (MISCELLANEOUS) ×2 IMPLANT
CLAMP UMBILICAL CORD (MISCELLANEOUS) ×1 IMPLANT
CLOTH BEACON ORANGE TIMEOUT ST (SAFETY) ×1 IMPLANT
DISSECTOR SURG LIGASURE 21 (MISCELLANEOUS) IMPLANT
DRSG OPSITE POSTOP 4X10 (GAUZE/BANDAGES/DRESSINGS) ×1 IMPLANT
ELECTRODE REM PT RTRN 9FT ADLT (ELECTROSURGICAL) ×1 IMPLANT
EXTRACTOR VACUUM KIWI (MISCELLANEOUS) IMPLANT
EXTRACTOR VACUUM M CUP 4 TUBE (SUCTIONS) IMPLANT
GLOVE BIO SURGEON STRL SZ7 (GLOVE) ×1 IMPLANT
GLOVE BIOGEL PI IND STRL 7.0 (GLOVE) ×3 IMPLANT
GOWN STRL REUS W/TWL LRG LVL3 (GOWN DISPOSABLE) ×3 IMPLANT
KIT ABG SYR 3ML LUER SLIP (SYRINGE) IMPLANT
MAT PREVALON FULL STRYKER (MISCELLANEOUS) IMPLANT
NDL HYPO 25X5/8 SAFETYGLIDE (NEEDLE) IMPLANT
NEEDLE HYPO 25X5/8 SAFETYGLIDE (NEEDLE) IMPLANT
NS IRRIG 1000ML POUR BTL (IV SOLUTION) ×1 IMPLANT
PACK C SECTION WH (CUSTOM PROCEDURE TRAY) ×1 IMPLANT
PAD OB MATERNITY 4.3X12.25 (PERSONAL CARE ITEMS) ×1 IMPLANT
RTRCTR C-SECT PINK 25CM LRG (MISCELLANEOUS) IMPLANT
STRIP CLOSURE SKIN 1/2X4 (GAUZE/BANDAGES/DRESSINGS) IMPLANT
SUT MNCRL 0 VIOLET CTX 36 (SUTURE) ×2 IMPLANT
SUT PLAIN 0 NONE (SUTURE) IMPLANT
SUT PLAIN ABS 2-0 CT1 27XMFL (SUTURE) IMPLANT
SUT VIC AB 0 CT1 27XBRD ANBCTR (SUTURE) ×2 IMPLANT
SUT VIC AB 2-0 CT1 TAPERPNT 27 (SUTURE) ×1 IMPLANT
SUT VIC AB 4-0 KS 27 (SUTURE) ×1 IMPLANT
TOWEL OR 17X24 6PK STRL BLUE (TOWEL DISPOSABLE) ×1 IMPLANT
TRAY FOLEY W/BAG SLVR 14FR LF (SET/KITS/TRAYS/PACK) IMPLANT
WATER STERILE IRR 1000ML POUR (IV SOLUTION) ×1 IMPLANT

## 2023-11-29 NOTE — Anesthesia Procedure Notes (Addendum)
 Spinal  Patient location during procedure: OR Start time: 11/29/2023 2:06 PM End time: 11/29/2023 2:11 PM Reason for block: surgical anesthesia Staffing Performed: anesthesiologist  Anesthesiologist: Darlyn Rush, MD Performed by: Darlyn Rush, MD Authorized by: Darlyn Rush, MD   Preanesthetic Checklist Completed: patient identified, IV checked, site marked, risks and benefits discussed, surgical consent, monitors and equipment checked, pre-op evaluation and timeout performed Spinal Block Patient position: sitting Prep: DuraPrep and site prepped and draped Patient monitoring: heart rate, continuous pulse ox and blood pressure Approach: midline Location: L4-5 Injection technique: single-shot Needle Needle type: Spinocan  Needle gauge: 25 G Needle length: 9 cm Additional Notes Expiration date of kit checked and confirmed. Patient tolerated procedure well, without complications.

## 2023-11-29 NOTE — Op Note (Incomplete)
 Cesarean Section Procedure Note   Elizabeth Yang  11/29/2023  Procedure: Repeat Low Transverse Cesarean section and bilateral tubal sterilization by salpingectomy   Indications: Scheduled Proceedure/Maternal Request 37 weeks Poorly controlled A2GDM, Chronic HTN. H/o C/section x3  Pre-operative Diagnosis: Previous Cesarean Section x 3, Bilateral tubal ligation Desires Sterilization.   Post-operative Diagnosis: Same Extensive pelvic adhesions   Surgeon: Barbette Knock, MD - Primary                  Assistants:  Kandyce Sor, MD  Anesthesia: spinal   Procedure Details:  The patient was seen in the Holding Room. The risks, benefits, complications, treatment options, and expected outcomes were discussed with the patient. The patient concurred with the proposed plan, giving informed consent. identified as Elizabeth Yang and the procedure verified as C-Section Delivery and tubal sterilization.  A Time Out was held and the above information confirmed. 2 gm Ancef  and TXA given due to prior C/section x 3 with risk of hemorrhage  After induction of anesthesia, the patient was draped and prepped in the usual sterile manner, foley was draining urine well.  A pfannenstiel incision was made and carried down through the subcutaneous tissue to the fascia. Fascial incision was made and extended transversely. The fascia was separated from the underlying rectus tissue superiorly and inferiorly. The peritoneum was identified and entered. Peritoneal incision was extended longitudinally but bladder was noted high and uterus was encased in peritoneum.  Lower segment freed. Bladder blade placed and anterior wall retracted with Richardson retractor. Alexis-O retractor could not be placed. The utero-vesical peritoneal reflection was assessed. Low transverse hysterotomy was performed along with peritoneal incision. Amniotic fluid was clear. Delivered from cephalic presentation was a female infant with vigorous cry.  Apgar scores of 8 at one minute and 8 at five minutes. Delayed cord clamping done at 1 minute and baby handed to NICU team in attendance. Cord ph was not sent. Cord blood was obtained for evaluation. The placenta was removed Intact and appeared normal. The uterine outline ws normal. Peritoneal adhesions to uterus were easy to reach after uterus involuted and excised with bovie. Both tubes and ovaries appeared normal}. The uterine incision was closed with running locked sutures of . A second imbricating layer sutured at right angle area for hemostasis.   Hemostasis was observed.  Bilateral salpingectomy performed with LigaSure. Hemostasis noted.  Peritoneal closure was not possible. The fascia was then reapproximated with running sutures of 0Vicryl. The subcuticular closure was performed using 2-0plain gut. The skin was closed with 4-0Vicryl. Steristrips and honeycomb dressing placed.   Instrument, sponge, and needle counts were correct prior the abdominal closure and were correct at the conclusion of the case.    Findings:   Estimated Blood Loss: {None/Minimal: 21241}   Total IV Fluids: ***ml   Urine Output: {URINE AMOUNT/DESCRIPTION:1075551}  Specimens: @ORSPECIMEN @   Complications: {complications:3041415}  Disposition: {Op note disposition:31782}   Maternal Condition: {stable/unstable:60080}   Baby condition / location:  {baby status:3041455}  Attending Attestation: I performed the procedure.   An experienced assistant was required given the standard of surgical care given the complexity of the case.  This assistant was needed for exposure, dissection, suctioning, retraction, instrument exchange, assisting with delivery with administration of fundal pressure, and for overall help during the procedure.    Signed: Knock Barbette MD

## 2023-11-29 NOTE — H&P (Signed)
 Elizabeth Yang is a 43 y.o. female presenting for delivery at 37 wks for poor GDM control after consult with MFM Dr Arna.  She is for repeat C/section and tubal ligation  [id number], C/s x 3,  Darting by 1st trim sono.   G1- preterm C/s for severe IUGR and NT FHT with neonatal demise G2- term C/s  G3 term C/s  AMA, low risk NIPT boy CHTN - Amlodipine  10mg , Labetalol  200mg  bid (procardia  not tolerated), no superimposed PEC, A2GDM - Insulin Simglee and Metformin. Very fluctuating control. ANtesting twice wkly 10/10  Depression/ anxiety stable Sertraline, Buspar, Amitriptyline prn  Hypothyroid - Synthroid 50/ 75 variable dosing     OB History     Gravida  4   Para  3   Term  2   Preterm  1   AB  0   Living  3      SAB  0   IAB  0   Ectopic  0   Multiple  0   Live Births  3          Past Medical History:  Diagnosis Date   Anxiety    Asthma    Blood per rectum 09/01/2020   Boils 09/01/2020   Constipation 09/01/2020   Dehydration 09/01/2020   Diabetes mellitus without complication (HCC)    Gestational diabetes    Headache    Hematuria 09/01/2020   History of kidney stones    Hypertension    Hypertension affecting pregnancy, antepartum 09/13/2016   Kidney stones 09/01/2020   Loose stools 09/01/2020   Nausea and vomiting 09/01/2020   Vaginal Pap smear, abnormal    Vitamin D  deficiency    Past Surgical History:  Procedure Laterality Date   CESAREAN SECTION N/A 09/27/2014   Procedure: CESAREAN SECTION;  Surgeon: Marie-Lyne Lavoie, MD;  Location: [facility];  Service: Obstetrics;  Laterality: N/A;   CESAREAN SECTION N/A 09/13/2016   Procedure: Repeat CESAREAN SECTION;  Surgeon: Robbi Render, MD;  Location: [facility] BIRTHING SUITES;  Service: Obstetrics;  Laterality: N/A;  EDD: 09/26/16    CESAREAN SECTION N/A 04/16/2018   Procedure: Repeat CESAREAN SECTION;  Surgeon: Render Robbi, MD;  Location: [facility] BIRTHING SUITES;  Service: Obstetrics;  Laterality: N/A;   EDD: 04/23/18   COLONOSCOPY     CYSTOSCOPY/URETEROSCOPY/HOLMIUM LASER/STENT PLACEMENT Right 09/13/2020   Procedure: CYSTOSCOPY/URETEROSCOPYLASER LITHOTRISY/STENT PLACEMENT;  Surgeon: Cam Morene ORN, MD;  Location: [facility];  Service: Urology;  Laterality: Right;   KIDNEY STONE SURGERY     TONSILLECTOMY     WISDOM TOOTH EXTRACTION     Family History: family history includes Cancer in her maternal grandmother; Diabetes in her paternal grandmother; Heart attack in her maternal uncle and mother; Heart disease in her maternal aunt, maternal grandfather, maternal uncle, and mother; Hypertension in her mother; Kidney disease in her sister. Social History:  reports that she has never smoked. She has never used smokeless tobacco. She reports that she does not currently use alcohol. She reports that she does not use drugs.     Maternal Diabetes: Yes:  Diabetes Type:  Insulin/Medication controlled Genetic Screening: Normal Maternal Ultrasounds/Referrals: Normal Fetal Ultrasounds or other Referrals:  None Maternal Substance Abuse:  No Significant Maternal Medications:  Meds include: Other: Amlodipine , Labetalol , bbASA, Simglee insulin, Metformin 500mg  daily, Sertraline 100mg , Buspar bid, Amitriptyline as needed  Significant Maternal Lab Results:  Group B Strep negative Number of Prenatal Visits:greater than 3 verified prenatal visits Maternal Vaccinations:TDap Other Comments:  None  Review of  Systems History   There were no vitals taken for this visit. Exam Physical Exam  BP 129/89   Pulse 88   Temp 98.3 F (36.8 C) (Oral)   Resp 16   Ht 5' 2 (1.575 m)   Wt 96.5 kg   SpO2 97%   BMI 38.92 kg/m   A&O x 3, no acute distress. Pleasant HEENT neg, no thyromegaly Lungs CTA bilat CV RRR, S1S2 normal Abdo soft, non tender, non acute Extr no edema/ tenderness Pelvic cx closed  FHT  140s Toco  none  Prenatal labs: ABO, Rh: --/--/A POS (06/25 9062) Antibody: NEG (06/25  9062) Rubella: Immune (12/26 0000) RPR: NON REACTIVE (06/25 1000)  HBsAg: Negative (12/26 0000)  HepC neg  HIV: Non-reactive (12/26 0000)  GBS:   neg NIPT LR AFP1 neg   Assessment/Plan: 43 yo G4P2102 37 wks for early RC/s and BTL for poor GDM control CHTN A2GDM Hypothyroid Anxiety Depression   Risks/complications of surgery reviewed incl infection, bleeding, damage to internal organs including bladder, bowels, ureters, blood vessels, other risks from anesthesia, VTE and delayed complications of any surgery, complications in future surgery reviewed. Also discussed neonatal complications incl difficult delivery, laceration, vacuum assistance, TTN etc. Pt understands and agrees, all concerns addressed.   TL w/ irreversibility and risk of regret dw pt and pt voiced understanding   Robbi JONELLE Render MD

## 2023-11-29 NOTE — Transfer of Care (Signed)
 Immediate Anesthesia Transfer of Care Note  Patient: Elizabeth Yang  Procedure(s) Performed: CESAREAN SECTION, WITH BILATERAL TUBAL LIGATION (Bilateral)  Patient Location: PACU  Anesthesia Type:Spinal  Level of Consciousness: awake  Airway & Oxygen Therapy: Patient Spontanous Breathing  Post-op Assessment: Report given to RN and Post -op Vital signs reviewed and stable  Post vital signs: Reviewed and stable  Last Vitals:  Vitals Value Taken Time  BP 120/84 11/29/23 15:45  Temp 36.4 C 11/29/23 15:40  Pulse 78 11/29/23 15:50  Resp 18 11/29/23 15:50  SpO2 95 % 11/29/23 15:50  Vitals shown include unfiled device data.  Last Pain:  Vitals:   11/29/23 1540  TempSrc: Oral  PainSc: 0-No pain      Patients Stated Pain Goal: 5 (11/29/23 1237)  Complications: No notable events documented.

## 2023-11-29 NOTE — Lactation Note (Signed)
 This note was copied from a baby's chart. Lactation Consultation Note  Patient Name: Elizabeth Yang Date: 11/29/2023 Age:42 hours  [name] declined LC services on MBU.    Maternal Data    Feeding    LATCH Score Latch: Repeated attempts needed to sustain latch, nipple held in mouth throughout feeding, stimulation needed to elicit sucking reflex.  Audible Swallowing: A few with stimulation  Type of Nipple: Everted at rest and after stimulation  Comfort (Breast/Nipple): Soft / non-tender  Hold (Positioning): Assistance needed to correctly position infant at breast and maintain latch.  LATCH Score: 7   Lactation Tools Discussed/Used    Interventions    Discharge    Consult Status      Elizabeth Yang 11/29/2023, 9:18 PM

## 2023-11-29 NOTE — Anesthesia Preprocedure Evaluation (Addendum)
 Anesthesia Evaluation  Patient identified by MRN, date of birth, ID band Patient awake    Reviewed: Allergy & Precautions, NPO status , Patient's Chart, lab work & pertinent test results  Airway Mallampati: III  TM Distance: >3 FB Neck ROM: Full    Dental no notable dental hx. (+) Teeth Intact, Dental Advisory Given   Pulmonary asthma    Pulmonary exam normal breath sounds clear to auscultation       Cardiovascular hypertension, Pt. on medications and Pt. on home beta blockers Normal cardiovascular exam+ Valvular Problems/Murmurs MR  Rhythm:Regular Rate:Normal  Echocardiogram 11/16/2020:  Left ventricle cavity is normal in size and wall thickness. Normal global wall motion. Normal LV systolic function with EF 61%. Normal diastolic filling pattern.  Mild (Grade I) mitral regurgitation.  No evidence of pulmonary hypertension.     Neuro/Psych  Headaches PSYCHIATRIC DISORDERS Anxiety Depression       GI/Hepatic negative GI ROS, Neg liver ROS,,,  Endo/Other  diabetes    Renal/GU ARFRenal disease     Musculoskeletal negative musculoskeletal ROS (+)    Abdominal  (+) + obese  Peds  Hematology negative hematology ROS (+)   Anesthesia Other Findings   Reproductive/Obstetrics (+) Pregnancy                             Anesthesia Physical Anesthesia Plan  ASA: 3  Anesthesia Plan: Spinal   Post-op Pain Management: Tylenol  PO (pre-op)*, Gabapentin PO (pre-op)* and Toradol  IV (intra-op)*   Induction: Intravenous  PONV Risk Score and Plan: 4 or greater and Ondansetron , Dexamethasone , Treatment may vary due to age or medical condition and Scopolamine  patch - Pre-op  Airway Management Planned: Natural Airway  Additional Equipment:   Intra-op Plan:   Post-operative Plan:   Informed Consent: I have reviewed the patients History and Physical, chart, labs and discussed the procedure including the  risks, benefits and alternatives for the proposed anesthesia with the patient or authorized representative who has indicated his/her understanding and acceptance.     Dental advisory given  Plan Discussed with: CRNA  Anesthesia Plan Comments: (  )        Anesthesia Quick Evaluation

## 2023-11-30 ENCOUNTER — Encounter (HOSPITAL_COMMUNITY): Payer: Self-pay | Admitting: Obstetrics & Gynecology

## 2023-11-30 LAB — GLUCOSE, CAPILLARY
Glucose-Capillary: 94 mg/dL (ref 70–99)
Glucose-Capillary: 101 mg/dL — ABNORMAL HIGH (ref 70–99)
Glucose-Capillary: 142 mg/dL — ABNORMAL HIGH (ref 70–99)
Glucose-Capillary: 92 mg/dL (ref 70–99)

## 2023-11-30 LAB — CBC
HCT: 34.7 % — ABNORMAL LOW (ref 36.0–46.0)
Hemoglobin: 11.7 g/dL — ABNORMAL LOW (ref 12.0–15.0)
MCH: 30 pg (ref 26.0–34.0)
MCHC: 33.7 g/dL (ref 30.0–36.0)
MCV: 89 fL (ref 80.0–100.0)
Platelets: 197 10*3/uL (ref 150–400)
RBC: 3.9 MIL/uL (ref 3.87–5.11)
RDW: 14 % (ref 11.5–15.5)
WBC: 12.7 10*3/uL — ABNORMAL HIGH (ref 4.0–10.5)
nRBC: 0 % (ref 0.0–0.2)

## 2023-11-30 NOTE — Lactation Note (Signed)
 This note was copied from a baby's chart. Lactation Consultation Note  Patient Name: Elizabeth Yang Unijb'd Date: 11/30/2023 Age:42 hours Reason for consult: Initial assessment;Early term 37-38.6wks;Maternal endocrine disorder  Mother originally declined lactation assistance but was willing for LC to visit with her at this time. P4, Baby 37 weeks and sleepy after recent feeding. Discussed LPI feeding behavior. Mother states baby has started to become sleepy at the breast. Suggest post pumping and hand expression and giving volume pumped to baby. Set up DEBP.  18 mm flanges seem appropriate at this time. Suggest calling for latch assistance as needed. Feed on demand with cues.  Goal 8-12+ times per day after first 24 hrs.  Place baby STS if not cueing.  Wake baby if he has not fed in 3 hours.   Maternal Data Has patient been taught Hand Expression?: Yes Does the patient have breastfeeding experience prior to this delivery?: Yes How long did the patient breastfeed?: 13 mos  Feeding Mother's Current Feeding Choice: Breast Milk  Lactation Tools Discussed/Used Tools: Pump;Flanges Flange Size: 18 Breast pump type: Double-Electric Breast Pump;Manual Pump Education: Setup, frequency, and cleaning;Milk Storage Reason for Pumping: stimulation and supplementation Pumping frequency: q 3 hours for 15 min  Interventions Interventions: Education;DEBP  Discharge Pump: Personal  Consult Status Consult Status: Follow-up Date: 11/30/23    Shannon Dines [facility] 11/30/2023, 12:50 PM

## 2023-11-30 NOTE — Progress Notes (Signed)
 No c/o, pain controlled, nml lochia Tol po, not really ambulating much yet, no flatus Voids w/o difficulty  Patient Vitals for the past 24 hrs:  BP Temp Temp src Pulse Resp SpO2 Height Weight  11/30/23 1004 119/73 -- -- 75 17 95 % -- --  11/30/23 0415 114/77 98 F (36.7 C) Oral -- -- 98 % -- --  11/30/23 0036 124/85 98.3 F (36.8 C) Oral -- 18 98 % -- --  11/29/23 2227 -- -- -- -- -- 96 % -- --  11/29/23 2014 127/79 -- -- 93 18 -- -- --  11/29/23 2012 (!) 133/94 -- -- 92 18 -- -- --  11/29/23 2010 115/66 98.2 F (36.8 C) Oral 83 18 96 % -- --  11/29/23 1840 127/85 -- -- 81 -- 97 % -- --  11/29/23 1758 (!) 130/90 98.6 F (37 C) Oral 76 17 93 % -- --  11/29/23 1702 123/78 (!) 97.4 F (36.3 C) Oral 81 18 95 % -- --  11/29/23 1645 127/80 (!) 97.5 F (36.4 C) Oral 82 20 96 % -- --  11/29/23 1615 130/78 -- -- 76 19 96 % -- --  11/29/23 1540 119/79 (!) 97.5 F (36.4 C) Oral 80 -- 93 % -- --  11/29/23 1251 -- -- -- -- 16 -- -- --  11/29/23 1248 129/89 98.3 F (36.8 C) Oral 88 -- 97 % -- --  11/29/23 1237 -- -- -- -- -- -- 5' 2 (1.575 m) 96.5 kg   A&ox3 Rrr Ctab Abd: soft,nt,nd, +bs; fundus firm and below umb; dressing: intact, old blood/drainage LE: no edema,nt bilat     Latest Ref Rng & Units 11/30/2023    4:42 AM 11/27/2023   10:00 AM 07/15/2020    2:18 PM  CBC  WBC 4.0 - 10.5 K/uL 12.7  10.0  12.5   Hemoglobin 12.0 - 15.0 g/dL 88.2  87.6  84.9   Hematocrit 36.0 - 46.0 % 34.7  35.9  43.1   Platelets 150 - 400 K/uL 197  195  238    A/P: pod1 s/p rltcs with bilat salpingectomy Doing well, contin care CHTN - amlodipine  10mg  daily, labetalol  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

## 2023-11-30 NOTE — Progress Notes (Signed)
 [name] was referred for history of depression/anxiety. * Referral screened out by Clinical Social Worker because none of the following criteria appear to apply: ~ History of anxiety/depression during this pregnancy, or of post-partum depression following prior delivery. ~ Diagnosis of anxiety and/or depression within last 3 years OR * [name] symptoms currently being treated with medication and/or therapy. [name] is prescribed and taking sertraline 100mg  daily. [name] is also prescribed buspirone (declined during admission).   Please contact the Clinical Social Worker if needs arise, by [facility] request, or if [name] scores greater than 9/yes to question 10 on Edinburgh Postpartum Depression Screen.  Signed,  Sharyne LOIS Roulette, MSW, LCSWA, LCASA 11/30/2023 8:52 AM

## 2023-11-30 NOTE — Anesthesia Postprocedure Evaluation (Signed)
 Anesthesia Post Note  Patient: Elizabeth Yang  Procedure(s) Performed: CESAREAN SECTION, WITH BILATERAL TUBAL LIGATION (Bilateral)     Patient location during evaluation: PACU Anesthesia Type: Spinal Level of consciousness: awake and alert Pain management: pain level controlled Vital Signs Assessment: post-procedure vital signs reviewed and stable Respiratory status: spontaneous breathing Cardiovascular status: stable Anesthetic complications: no   No notable events documented.  Last Vitals:  Vitals:   11/30/23 0036 11/30/23 0415  BP: 124/85 114/77  Pulse:    Resp: 18   Temp: 36.8 C 36.7 C  SpO2: 98% 98%    Last Pain:  Vitals:   11/30/23 0415  TempSrc: Oral  PainSc:                  [name]

## 2023-12-01 LAB — BIRTH TISSUE RECOVERY COLLECTION (PLACENTA DONATION)

## 2023-12-01 LAB — GLUCOSE, CAPILLARY: Glucose-Capillary: 80 mg/dL (ref 70–99)

## 2023-12-01 MED ORDER — OXYCODONE HCL 5 MG PO TABS: 5.0000 mg | ORAL_TABLET | ORAL | 0 refills | Status: AC | PRN

## 2023-12-01 MED ORDER — IBUPROFEN 600 MG PO TABS: 600.0000 mg | ORAL_TABLET | Freq: Four times a day (QID) | ORAL | 0 refills | Status: AC

## 2023-12-01 NOTE — Lactation Note (Signed)
 This note was copied from a baby's chart. Lactation Consultation Note  Patient Name: Elizabeth Yang Unijb'd Date: 12/01/2023 Age:42 hours Reason for consult: Follow-up assessment;Maternal discharge;Early term 90-38.6wks  Experienced breast feeder. Discharge anticipated today. Encouraged mom to keep working on big mouth latch with baby and use EBM or coconut oil after each feed. Discussed cluster feeding overnight/ early morning brings in our milk supply, shared expectations of milk coming in. Highlighted risk of engorgement. Discussed hand pump/express to soften breasts, motrin  as anti-inflammatory, and ice packs for 10-20 minutes post feed/pumping if still over-full is the best treatments for inflamed/engorged breasts. Hand pump provided and hand pump from pumping kit parts demonstrated for mom- highlighted as best tool to soften engorged breast.  Maternal Data Does the patient have breastfeeding experience prior to this delivery?: Yes  Feeding Mother's Current Feeding Choice: Breast Milk   Lactation Tools Discussed/Used Flange Size: 18 Breast pump type: Manual  Interventions Interventions: Hand pump;Education  Discharge    Consult Status Consult Status: Complete Date: 12/01/23 Follow-up type: In-patient    [facility] 12/01/2023, 3:18 PM

## 2023-12-01 NOTE — Progress Notes (Signed)
 No c/o, tol po, ambulating, pain controlled Voids w/o difficulty, +flatus  Patient Vitals for the past 24 hrs:  BP Temp Temp src Pulse Resp SpO2  12/01/23 0509 123/80 98 F (36.7 C) Oral 70 18 97 %  11/30/23 2040 130/88 98.7 F (37.1 C) Oral 76 18 96 %  11/30/23 1505 122/71 98.3 F (36.8 C) Oral 73 17 96 %   A&ox3 Rrr Ctab Abd: soft, nt, nd; +bs; dressing: c/d/I; fundus firm and below umb LE: tr edema, nt bilat     Latest Ref Rng & Units 11/30/2023    4:42 AM 11/27/2023   10:00 AM 07/15/2020    2:18 PM  CBC  WBC 4.0 - 10.5 K/uL 12.7  10.0  12.5   Hemoglobin 12.0 - 15.0 g/dL 88.2  87.6  84.9   Hematocrit 36.0 - 46.0 % 34.7  35.9  43.1   Platelets 150 - 400 K/uL 197  195  238    A/P: pod2 s/p rltcs/btl Doing well, wants d/c home 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

## 2023-12-03 LAB — SURGICAL PATHOLOGY

## 2023-12-04 ENCOUNTER — Encounter (HOSPITAL_COMMUNITY)
Admission: RE | Admit: 2023-12-04 | Discharge: 2023-12-04 | Disposition: A | Discharge status: Home / Self Care | Source: Ambulatory Visit | Attending: Obstetrics & Gynecology | Admitting: Obstetrics & Gynecology

## 2023-12-04 HISTORY — DX: Gestational diabetes mellitus in pregnancy, unspecified control: O24.419

## 2023-12-05 NOTE — Discharge Summary (Signed)
 Postpartum Discharge Summary  Date of Service updated     Patient Name: Elizabeth Yang DOB: 05/27/1981 MRN: [id number]  Date of admission: 11/29/2023 Delivery date:11/29/2023 Delivering provider: MODY, VAISHALI Date of discharge: 12/01/23  Admitting diagnosis: Previous cesarean section [Z98.891] Status post repeat low transverse cesarean section [Z98.891] Intrauterine pregnancy: [redacted]w[redacted]d     Secondary diagnosis:  Active Problems:   Previous cesarean section   Status post repeat low transverse cesarean section  Additional problems: none    Discharge diagnosis: Term Pregnancy Delivered                                              Complications: None  Hospital course: Sceduled C/S   43 y.o. yo [id number] at [redacted]w[redacted]d was admitted to the hospital 11/29/2023 for scheduled cesarean section with the following indication:Elective Repeat.Delivery details are as follows:  Membrane Rupture Time/Date: 2:41 PM,11/29/2023  Delivery Method:C-Section, Low Transverse  Details of operation can be found in separate operative note.  Patient had a postpartum course complicated by nothing.  She is ambulating, tolerating a regular diet, passing flatus, and urinating well. Patient is discharged home in stable condition on  12/05/23        Newborn Data: Birth date:11/29/2023 Birth time:2:42 PM Gender:Female Living status:Living Apgars:8 ,8  Weight:3050 g     Immunizations administered: Immunization History  Administered Date(s) Administered   Tdap 09/28/2014    Physical exam  Vitals:   11/30/23 1004 11/30/23 1505 11/30/23 2040 12/01/23 0509  BP: 119/73 122/71 130/88 123/80  Pulse: 75 73 76 70  Resp: 17 17 18 18   Temp:  98.3 F (36.8 C) 98.7 F (37.1 C) 98 F (36.7 C)  TempSrc:  Oral Oral Oral  SpO2: 95% 96% 96% 97%  Weight:      Height:       Labs: Lab Results  Component Value Date   WBC 12.7 (H) 11/30/2023   HGB 11.7 (L) 11/30/2023   HCT 34.7 (L) 11/30/2023   MCV 89.0 11/30/2023   PLT  197 11/30/2023      Latest Ref Rng & Units 07/15/2020    2:18 PM  CMP  Glucose 70 - 99 mg/dL 888   BUN 6 - 20 mg/dL 14   Creatinine 9.55 - 1.00 mg/dL 9.17   Sodium 864 - 854 mmol/L 137   Potassium 3.5 - 5.1 mmol/L 3.9   Chloride 98 - 111 mmol/L 108   CO2 22 - 32 mmol/L 20   Calcium  8.9 - 10.3 mg/dL 9.1   Total Protein 6.5 - 8.1 g/dL 7.1   Total Bilirubin 0.3 - 1.2 mg/dL 0.9   Alkaline Phos 38 - 126 U/L 49   AST 15 - 41 U/L 8   ALT 0 - 44 U/L 15    Edinburgh Score:    12/01/2023    3:50 AM  Edinburgh Postnatal Depression Scale Screening Tool  I have been able to laugh and see the funny side of things. 0  I have looked forward with enjoyment to things. 0  I have blamed myself unnecessarily when things went wrong. 2  I have been anxious or worried for no good reason. 1  I have felt scared or panicky for no good reason. 0  Things have been getting on top of me. 0  I have been so unhappy that I have had  difficulty sleeping. 0  I have felt sad or miserable. 0  I have been so unhappy that I have been crying. 0  The thought of harming myself has occurred to me. 0  Edinburgh Postnatal Depression Scale Total 3      After visit meds:  Allergies as of 12/01/2023   No Known Allergies      Medication List     STOP taking these medications    cyclobenzaprine 10 MG tablet Commonly known as: FLEXERIL   FOLIC ACID PO       TAKE these medications    albuterol  108 (90 Base) MCG/ACT inhaler Commonly known as: VENTOLIN  HFA Inhale 2 puffs into the lungs every 6 (six) hours as needed for wheezing or shortness of breath.   amitriptyline 25 MG tablet Commonly known as: ELAVIL Take 25 mg by mouth daily as needed (migraines).   amLODipine  10 MG tablet Commonly known as: NORVASC  Take 1 tablet (10 mg total) by mouth daily. What changed: when to take this   butalbital -acetaminophen -caffeine  50-325-40 MG tablet Commonly known as: FIORICET  Take 1 tablet by mouth 2 (two) times  daily as needed for migraine.   ibuprofen  600 MG tablet Commonly known as: ADVIL  Take 1 tablet (600 mg total) by mouth every 6 (six) hours.   labetalol  200 MG tablet Commonly known as: NORMODYNE  Take 200 mg by mouth 2 (two) times daily.   levothyroxine 75 MCG tablet Commonly known as: SYNTHROID Take 75 mcg by mouth 2 (two) times a week. Saturdays and Sundays   levothyroxine 50 MCG tablet Commonly known as: SYNTHROID Take 1 tablet by mouth every Monday, Tuesday, Wednesday, Thursday, and Friday.   oxyCODONE  5 MG immediate release tablet Commonly known as: Oxy IR/ROXICODONE  Take 1-2 tablets (5-10 mg total) by mouth every 4 (four) hours as needed for moderate pain (pain score 4-6).   PRENATAL PO Take 1 tablet by mouth daily.   sertraline 100 MG tablet Commonly known as: ZOLOFT Take 100 mg by mouth daily.         Discharge home in stable condition Infant Feeding: Breast Infant Disposition:home with mother Discharge instruction: per After Visit Summary and Postpartum booklet. Activity: Advance as tolerated. Pelvic rest for 6 weeks.  Diet: routine diet Postpartum Appointment:6 weeks Additional Postpartum F/U: BP check 1 week Future Appointments: Future Appointments  Date Time Provider Department Center  12/13/2023  8:20 AM Tobb, Kardie, DO [facility]   Follow up Visit:  Follow-up Information     Barbette Knock, MD Follow up.   Specialty: Obstetrics and Gynecology Contact information: [address] [place] [address] [phone]         Barbette Knock, MD Follow up.   Specialty: Obstetrics and Gynecology Contact information: [address] [place] [address] [phone]                     12/05/2023 Devere FORBES Brave, MD

## 2023-12-10 ENCOUNTER — Telehealth (HOSPITAL_COMMUNITY): Payer: Self-pay | Admitting: *Deleted

## 2023-12-10 NOTE — Telephone Encounter (Signed)
 12/10/2023  Name: Elizabeth Yang MRN: [id number] DOB: Aug 07, 1980  Reason for Call:  Transition of Care Hospital Discharge Call  Contact Status: Patient Contact Status: Complete  Language assistant needed: Interpreter Mode: Interpreter Not Needed        Follow-Up Questions: Do You Have Any Concerns About Your Health As You Heal From Delivery?: No Do You Have Any Concerns About Your Infants Health?: No  Edinburgh Postnatal Depression Scale:  In the Past 7 Days: I have been able to laugh and see the funny side of things.: As much as I always could I have looked forward with enjoyment to things.: As much as I ever did I have blamed myself unnecessarily when things went wrong.: Not very often I have been anxious or worried for no good reason.: No, not at all I have felt scared or panicky for no good reason.: No, not at all Things have been getting on top of me.: No, I have been coping as well as ever I have been so unhappy that I have had difficulty sleeping.: Not very often I have felt sad or miserable.: No, not at all I have been so unhappy that I have been crying.: No, never The thought of harming myself has occurred to me.: Never [place] Postnatal Depression Scale Total: 2  PHQ2-9 Depression Scale:     Discharge Follow-up: Edinburgh score requires follow up?: No Patient was advised of the following resources:: Support Group, Breastfeeding Support Group (declines postpartum group information via email)  Post-discharge interventions: Reviewed Newborn Safe Sleep Practices  Mliss Sieve, RN 12/10/2023 13:13

## 2023-12-11 ENCOUNTER — Encounter: Payer: Self-pay | Admitting: *Deleted

## 2023-12-13 ENCOUNTER — Ambulatory Visit: Admitting: Cardiology

## 2024-03-10 ENCOUNTER — Ambulatory Visit: Attending: Cardiology | Admitting: Cardiology
